# Patient Record
Sex: Female | Born: 1950
Health system: Southern US, Community
[De-identification: ages and names within clinical notes are randomized; demographics above are authoritative.]

## PROBLEM LIST (undated history)

## (undated) DIAGNOSIS — E079 Disorder of thyroid, unspecified: Secondary | ICD-10-CM

## (undated) DIAGNOSIS — K219 Gastro-esophageal reflux disease without esophagitis: Secondary | ICD-10-CM

## (undated) DIAGNOSIS — M8580 Other specified disorders of bone density and structure, unspecified site: Secondary | ICD-10-CM

## (undated) DIAGNOSIS — M722 Plantar fascial fibromatosis: Secondary | ICD-10-CM

## (undated) DIAGNOSIS — E785 Hyperlipidemia, unspecified: Secondary | ICD-10-CM

## (undated) DIAGNOSIS — G5702 Lesion of sciatic nerve, left lower limb: Secondary | ICD-10-CM

## (undated) HISTORY — DX: Plantar fascial fibromatosis: M72.2

## (undated) HISTORY — PX: BREAST SURGERY: SHX581

## (undated) HISTORY — DX: Hyperlipidemia, unspecified: E78.5

## (undated) HISTORY — DX: Gastro-esophageal reflux disease without esophagitis: K21.9

## (undated) HISTORY — PX: CATARACT EXTRACTION, BILATERAL: SHX1313

## (undated) HISTORY — DX: Disorder of thyroid, unspecified: E07.9

## (undated) HISTORY — DX: Lesion of sciatic nerve, left lower limb: G57.02

## (undated) HISTORY — PX: FRACTURE SURGERY: SHX138

## (undated) HISTORY — PX: TONSILLECTOMY: SUR1361

## (undated) HISTORY — DX: Other specified disorders of bone density and structure, unspecified site: M85.80

---

## 1999-02-28 ENCOUNTER — Other Ambulatory Visit: Admission: RE | Admit: 1999-02-28 | Discharge: 1999-02-28 | Payer: Self-pay | Admitting: Obstetrics and Gynecology

## 1999-07-17 ENCOUNTER — Encounter: Payer: Self-pay | Admitting: Emergency Medicine

## 1999-07-17 ENCOUNTER — Inpatient Hospital Stay (HOSPITAL_COMMUNITY): Admission: EM | Admit: 1999-07-17 | Discharge: 1999-07-18 | Payer: Self-pay | Admitting: Emergency Medicine

## 1999-07-26 ENCOUNTER — Ambulatory Visit (HOSPITAL_COMMUNITY): Admission: RE | Admit: 1999-07-26 | Discharge: 1999-07-26 | Payer: Self-pay | Admitting: Infectious Diseases

## 1999-08-15 ENCOUNTER — Ambulatory Visit (HOSPITAL_COMMUNITY): Admission: RE | Admit: 1999-08-15 | Discharge: 1999-08-15 | Payer: Self-pay | Admitting: Infectious Diseases

## 1999-08-25 ENCOUNTER — Ambulatory Visit (HOSPITAL_COMMUNITY): Admission: RE | Admit: 1999-08-25 | Discharge: 1999-08-25 | Payer: Self-pay | Admitting: Internal Medicine

## 1999-08-25 ENCOUNTER — Encounter: Payer: Self-pay | Admitting: Internal Medicine

## 2000-08-08 ENCOUNTER — Other Ambulatory Visit: Admission: RE | Admit: 2000-08-08 | Discharge: 2000-08-08 | Payer: Self-pay | Admitting: Gynecology

## 2002-07-08 ENCOUNTER — Other Ambulatory Visit: Admission: RE | Admit: 2002-07-08 | Discharge: 2002-07-08 | Payer: Self-pay | Admitting: Obstetrics and Gynecology

## 2003-01-10 HISTORY — PX: COLONOSCOPY: SHX174

## 2003-04-29 ENCOUNTER — Encounter: Payer: Self-pay | Admitting: Internal Medicine

## 2003-04-29 LAB — HM COLONOSCOPY

## 2003-07-15 ENCOUNTER — Other Ambulatory Visit: Admission: RE | Admit: 2003-07-15 | Discharge: 2003-07-15 | Payer: Self-pay | Admitting: Obstetrics and Gynecology

## 2003-08-30 ENCOUNTER — Emergency Department (HOSPITAL_COMMUNITY): Admission: EM | Admit: 2003-08-30 | Discharge: 2003-08-30 | Payer: Self-pay | Admitting: Emergency Medicine

## 2004-06-24 ENCOUNTER — Ambulatory Visit: Payer: Self-pay | Admitting: Internal Medicine

## 2004-06-28 ENCOUNTER — Ambulatory Visit: Payer: Self-pay | Admitting: Internal Medicine

## 2004-07-19 ENCOUNTER — Ambulatory Visit: Payer: Self-pay | Admitting: Internal Medicine

## 2004-07-26 ENCOUNTER — Other Ambulatory Visit: Admission: RE | Admit: 2004-07-26 | Discharge: 2004-07-26 | Payer: Self-pay | Admitting: Obstetrics and Gynecology

## 2004-08-03 ENCOUNTER — Encounter: Admission: RE | Admit: 2004-08-03 | Discharge: 2004-11-01 | Payer: Self-pay | Admitting: Internal Medicine

## 2004-09-27 ENCOUNTER — Ambulatory Visit: Payer: Self-pay | Admitting: Internal Medicine

## 2004-10-26 ENCOUNTER — Ambulatory Visit: Payer: Self-pay | Admitting: Internal Medicine

## 2004-12-27 ENCOUNTER — Ambulatory Visit: Payer: Self-pay | Admitting: Internal Medicine

## 2005-01-31 ENCOUNTER — Ambulatory Visit: Payer: Self-pay | Admitting: Internal Medicine

## 2005-05-02 ENCOUNTER — Ambulatory Visit: Payer: Self-pay | Admitting: Internal Medicine

## 2005-08-08 ENCOUNTER — Other Ambulatory Visit: Admission: RE | Admit: 2005-08-08 | Discharge: 2005-08-08 | Payer: Self-pay | Admitting: Obstetrics and Gynecology

## 2005-08-28 ENCOUNTER — Encounter: Admission: RE | Admit: 2005-08-28 | Discharge: 2005-08-28 | Payer: Self-pay | Admitting: Internal Medicine

## 2005-08-28 ENCOUNTER — Ambulatory Visit: Payer: Self-pay | Admitting: Internal Medicine

## 2005-09-01 ENCOUNTER — Encounter: Admission: RE | Admit: 2005-09-01 | Discharge: 2005-09-01 | Payer: Self-pay | Admitting: Internal Medicine

## 2006-06-01 ENCOUNTER — Encounter: Payer: Self-pay | Admitting: Internal Medicine

## 2006-06-01 ENCOUNTER — Ambulatory Visit: Payer: Self-pay | Admitting: Internal Medicine

## 2006-06-01 DIAGNOSIS — E890 Postprocedural hypothyroidism: Secondary | ICD-10-CM

## 2006-06-28 ENCOUNTER — Ambulatory Visit: Payer: Self-pay | Admitting: Internal Medicine

## 2006-06-29 ENCOUNTER — Encounter (INDEPENDENT_AMBULATORY_CARE_PROVIDER_SITE_OTHER): Payer: Self-pay | Admitting: *Deleted

## 2006-06-29 LAB — CONVERTED CEMR LAB
AST: 32 units/L (ref 0–37)
Cholesterol: 107 mg/dL (ref 0–200)
HDL: 28.4 mg/dL — ABNORMAL LOW (ref >39.0)
LDL Cholesterol: 61 mg/dL (ref 0–99)
Potassium: 4.2 meq/L (ref 3.5–5.1)
TSH: 0.61 microintl units/mL (ref 0.35–5.50)
Total CHOL/HDL Ratio: 3.8
VLDL: 18 mg/dL (ref 0–40)

## 2006-07-04 ENCOUNTER — Encounter (INDEPENDENT_AMBULATORY_CARE_PROVIDER_SITE_OTHER): Payer: Self-pay | Admitting: *Deleted

## 2006-08-13 ENCOUNTER — Other Ambulatory Visit: Admission: RE | Admit: 2006-08-13 | Discharge: 2006-08-13 | Payer: Self-pay | Admitting: Obstetrics and Gynecology

## 2006-11-05 ENCOUNTER — Telehealth (INDEPENDENT_AMBULATORY_CARE_PROVIDER_SITE_OTHER): Payer: Self-pay | Admitting: *Deleted

## 2007-04-29 ENCOUNTER — Telehealth (INDEPENDENT_AMBULATORY_CARE_PROVIDER_SITE_OTHER): Payer: Self-pay | Admitting: *Deleted

## 2007-06-19 ENCOUNTER — Telehealth (INDEPENDENT_AMBULATORY_CARE_PROVIDER_SITE_OTHER): Payer: Self-pay | Admitting: *Deleted

## 2007-06-25 ENCOUNTER — Ambulatory Visit: Payer: Self-pay | Admitting: Internal Medicine

## 2007-07-07 LAB — CONVERTED CEMR LAB
ALT: 23 units/L (ref 0–35)
AST: 31 units/L (ref 0–37)
Cholesterol: 106 mg/dL (ref 0–200)
Creatinine, Ser: 0.7 mg/dL (ref 0.4–1.2)
HDL: 26.9 mg/dL — ABNORMAL LOW (ref >39.0)
TSH: 1.8 microintl units/mL (ref 0.35–5.50)
Triglycerides: 73 mg/dL (ref 0–149)
VLDL: 15 mg/dL (ref 0–40)

## 2007-07-09 ENCOUNTER — Encounter (INDEPENDENT_AMBULATORY_CARE_PROVIDER_SITE_OTHER): Payer: Self-pay | Admitting: *Deleted

## 2007-07-09 LAB — CONVERTED CEMR LAB: Vit D, 1,25-Dihydroxy: 45 (ref 30–89)

## 2007-07-15 ENCOUNTER — Telehealth (INDEPENDENT_AMBULATORY_CARE_PROVIDER_SITE_OTHER): Payer: Self-pay | Admitting: *Deleted

## 2007-08-01 ENCOUNTER — Telehealth (INDEPENDENT_AMBULATORY_CARE_PROVIDER_SITE_OTHER): Payer: Self-pay | Admitting: *Deleted

## 2007-08-05 ENCOUNTER — Encounter (INDEPENDENT_AMBULATORY_CARE_PROVIDER_SITE_OTHER): Payer: Self-pay | Admitting: *Deleted

## 2007-08-09 ENCOUNTER — Ambulatory Visit: Payer: Self-pay | Admitting: Internal Medicine

## 2007-08-09 ENCOUNTER — Encounter: Payer: Self-pay | Admitting: Internal Medicine

## 2007-08-15 ENCOUNTER — Other Ambulatory Visit: Admission: RE | Admit: 2007-08-15 | Discharge: 2007-08-15 | Payer: Self-pay | Admitting: Obstetrics and Gynecology

## 2007-08-21 ENCOUNTER — Telehealth (INDEPENDENT_AMBULATORY_CARE_PROVIDER_SITE_OTHER): Payer: Self-pay | Admitting: *Deleted

## 2007-08-27 ENCOUNTER — Ambulatory Visit: Payer: Self-pay | Admitting: Internal Medicine

## 2007-08-27 DIAGNOSIS — B370 Candidal stomatitis: Secondary | ICD-10-CM

## 2007-08-27 DIAGNOSIS — E785 Hyperlipidemia, unspecified: Secondary | ICD-10-CM

## 2007-08-27 DIAGNOSIS — M8580 Other specified disorders of bone density and structure, unspecified site: Secondary | ICD-10-CM | POA: Insufficient documentation

## 2007-08-27 DIAGNOSIS — M810 Age-related osteoporosis without current pathological fracture: Secondary | ICD-10-CM | POA: Insufficient documentation

## 2007-08-27 LAB — CONVERTED CEMR LAB: Cholesterol, target level: 200 mg/dL

## 2007-09-04 ENCOUNTER — Telehealth (INDEPENDENT_AMBULATORY_CARE_PROVIDER_SITE_OTHER): Payer: Self-pay | Admitting: *Deleted

## 2007-09-12 ENCOUNTER — Telehealth (INDEPENDENT_AMBULATORY_CARE_PROVIDER_SITE_OTHER): Payer: Self-pay | Admitting: *Deleted

## 2007-09-13 ENCOUNTER — Telehealth (INDEPENDENT_AMBULATORY_CARE_PROVIDER_SITE_OTHER): Payer: Self-pay | Admitting: *Deleted

## 2007-10-14 ENCOUNTER — Ambulatory Visit: Payer: Self-pay | Admitting: Internal Medicine

## 2007-10-14 DIAGNOSIS — M722 Plantar fascial fibromatosis: Secondary | ICD-10-CM

## 2007-10-14 HISTORY — DX: Plantar fascial fibromatosis: M72.2

## 2007-11-06 ENCOUNTER — Ambulatory Visit: Payer: Self-pay | Admitting: Internal Medicine

## 2007-11-15 ENCOUNTER — Telehealth (INDEPENDENT_AMBULATORY_CARE_PROVIDER_SITE_OTHER): Payer: Self-pay | Admitting: *Deleted

## 2007-11-15 LAB — CONVERTED CEMR LAB
Alkaline Phosphatase: 68 units/L (ref 39–117)
Bilirubin, Direct: 0.1 mg/dL (ref 0.0–0.3)
Cholesterol: 111 mg/dL (ref 0–200)
LDL Cholesterol: 63 mg/dL (ref 0–99)
Total Bilirubin: 0.8 mg/dL (ref 0.3–1.2)
Total CHOL/HDL Ratio: 5.3
Total Protein: 6.9 g/dL (ref 6.0–8.3)
VLDL: 27 mg/dL (ref 0–40)

## 2007-11-18 ENCOUNTER — Encounter (INDEPENDENT_AMBULATORY_CARE_PROVIDER_SITE_OTHER): Payer: Self-pay | Admitting: *Deleted

## 2008-01-01 ENCOUNTER — Telehealth (INDEPENDENT_AMBULATORY_CARE_PROVIDER_SITE_OTHER): Payer: Self-pay | Admitting: *Deleted

## 2008-01-02 ENCOUNTER — Ambulatory Visit: Payer: Self-pay | Admitting: Internal Medicine

## 2008-01-02 DIAGNOSIS — K117 Disturbances of salivary secretion: Secondary | ICD-10-CM

## 2008-01-02 DIAGNOSIS — R439 Unspecified disturbances of smell and taste: Secondary | ICD-10-CM

## 2008-02-25 ENCOUNTER — Encounter (INDEPENDENT_AMBULATORY_CARE_PROVIDER_SITE_OTHER): Payer: Self-pay | Admitting: *Deleted

## 2008-02-25 ENCOUNTER — Ambulatory Visit: Payer: Self-pay | Admitting: Sports Medicine

## 2008-02-25 DIAGNOSIS — M79609 Pain in unspecified limb: Secondary | ICD-10-CM

## 2008-02-26 ENCOUNTER — Encounter: Payer: Self-pay | Admitting: Sports Medicine

## 2008-08-17 ENCOUNTER — Encounter: Payer: Self-pay | Admitting: Obstetrics and Gynecology

## 2008-08-17 ENCOUNTER — Ambulatory Visit: Payer: Self-pay | Admitting: Obstetrics and Gynecology

## 2008-08-17 ENCOUNTER — Other Ambulatory Visit: Admission: RE | Admit: 2008-08-17 | Discharge: 2008-08-17 | Payer: Self-pay | Admitting: Obstetrics and Gynecology

## 2008-09-17 ENCOUNTER — Telehealth (INDEPENDENT_AMBULATORY_CARE_PROVIDER_SITE_OTHER): Payer: Self-pay | Admitting: *Deleted

## 2008-10-07 ENCOUNTER — Telehealth (INDEPENDENT_AMBULATORY_CARE_PROVIDER_SITE_OTHER): Payer: Self-pay | Admitting: *Deleted

## 2008-10-30 ENCOUNTER — Ambulatory Visit: Payer: Self-pay | Admitting: Internal Medicine

## 2008-11-01 LAB — CONVERTED CEMR LAB
ALT: 23 U/L
AST: 24 U/L
Albumin: 4 g/dL
Alkaline Phosphatase: 73 U/L
BUN: 15 mg/dL
Basophils Absolute: 0 10*3/uL
Basophils Relative: 0.5 %
Bilirubin, Direct: 0 mg/dL
CO2: 30 meq/L
Calcium: 9.1 mg/dL
Chloride: 105 meq/L
Cholesterol: 128 mg/dL
Creatinine, Ser: 0.8 mg/dL
Eosinophils Absolute: 0.1 10*3/uL
Eosinophils Relative: 1.5 %
GFR calc non Af Amer: 78.57 mL/min
Glucose, Bld: 78 mg/dL
HCT: 42.4 %
HDL: 27.7 mg/dL — ABNORMAL LOW
Hemoglobin: 14.4 g/dL
Hgb A1c MFr Bld: 5.5 %
LDL Cholesterol: 72 mg/dL
Lymphocytes Relative: 32.2 %
Lymphs Abs: 1.9 10*3/uL
MCHC: 34.1 g/dL
MCV: 96.1 fL
Monocytes Absolute: 0.6 10*3/uL
Monocytes Relative: 10.2 %
Neutro Abs: 3.3 10*3/uL
Neutrophils Relative %: 55.6 %
Platelets: 294 10*3/uL
Potassium: 4.1 meq/L
RBC: 4.41 M/uL
RDW: 12.4 %
Sodium: 142 meq/L
TSH: 1.75 u[IU]/mL
Total Bilirubin: 1 mg/dL
Total CHOL/HDL Ratio: 5
Total Protein: 7.1 g/dL
Triglycerides: 142 mg/dL
VLDL: 28.4 mg/dL
WBC: 5.9 10*3/uL

## 2008-11-10 ENCOUNTER — Ambulatory Visit: Payer: Self-pay | Admitting: Internal Medicine

## 2008-11-10 DIAGNOSIS — M6740 Ganglion, unspecified site: Secondary | ICD-10-CM | POA: Insufficient documentation

## 2008-11-21 ENCOUNTER — Encounter: Payer: Self-pay | Admitting: Internal Medicine

## 2008-11-24 ENCOUNTER — Ambulatory Visit: Payer: Self-pay | Admitting: Internal Medicine

## 2008-11-24 DIAGNOSIS — J309 Allergic rhinitis, unspecified: Secondary | ICD-10-CM | POA: Insufficient documentation

## 2008-11-24 DIAGNOSIS — R519 Headache, unspecified: Secondary | ICD-10-CM | POA: Insufficient documentation

## 2008-11-24 DIAGNOSIS — R3915 Urgency of urination: Secondary | ICD-10-CM

## 2008-11-24 DIAGNOSIS — R51 Headache: Secondary | ICD-10-CM

## 2009-08-19 ENCOUNTER — Other Ambulatory Visit: Admission: RE | Admit: 2009-08-19 | Discharge: 2009-08-19 | Payer: Self-pay | Admitting: Obstetrics and Gynecology

## 2009-08-19 ENCOUNTER — Ambulatory Visit: Payer: Self-pay | Admitting: Obstetrics and Gynecology

## 2009-11-17 ENCOUNTER — Telehealth (INDEPENDENT_AMBULATORY_CARE_PROVIDER_SITE_OTHER): Payer: Self-pay | Admitting: *Deleted

## 2010-01-12 ENCOUNTER — Other Ambulatory Visit: Payer: Self-pay | Admitting: Internal Medicine

## 2010-01-12 ENCOUNTER — Ambulatory Visit
Admission: RE | Admit: 2010-01-12 | Discharge: 2010-01-12 | Payer: Self-pay | Source: Home / Self Care | Attending: Internal Medicine | Admitting: Internal Medicine

## 2010-01-12 LAB — BASIC METABOLIC PANEL
BUN: 9 mg/dL (ref 6–23)
CO2: 28 mEq/L (ref 19–32)
Calcium: 9 mg/dL (ref 8.4–10.5)
Chloride: 107 mEq/L (ref 96–112)
Creatinine, Ser: 0.5 mg/dL (ref 0.4–1.2)
GFR: 137.76 mL/min (ref >60.00)
Glucose, Bld: 91 mg/dL (ref 70–99)
Potassium: 4.9 mEq/L (ref 3.5–5.1)
Sodium: 140 mEq/L (ref 135–145)

## 2010-01-12 LAB — CBC WITH DIFFERENTIAL/PLATELET
Basophils Absolute: 0 10*3/uL (ref 0.0–0.1)
Basophils Relative: 0.5 % (ref 0.0–3.0)
Eosinophils Absolute: 0.1 10*3/uL (ref 0.0–0.7)
Eosinophils Relative: 2.4 % (ref 0.0–5.0)
HCT: 41.9 % (ref 36.0–46.0)
Hemoglobin: 14.1 g/dL (ref 12.0–15.0)
Lymphocytes Relative: 33.3 % (ref 12.0–46.0)
Lymphs Abs: 2 10*3/uL (ref 0.7–4.0)
MCHC: 33.6 g/dL (ref 30.0–36.0)
MCV: 94 fl (ref 78.0–100.0)
Monocytes Absolute: 0.6 10*3/uL (ref 0.1–1.0)
Monocytes Relative: 9.3 % (ref 3.0–12.0)
Neutro Abs: 3.3 10*3/uL (ref 1.4–7.7)
Neutrophils Relative %: 54.5 % (ref 43.0–77.0)
Platelets: 324 10*3/uL (ref 150.0–400.0)
RBC: 4.45 Mil/uL (ref 3.87–5.11)
RDW: 13.1 % (ref 11.5–14.6)
WBC: 6 10*3/uL (ref 4.5–10.5)

## 2010-01-12 LAB — HEPATIC FUNCTION PANEL
ALT: 24 U/L (ref 0–35)
AST: 24 U/L (ref 0–37)
Albumin: 3.7 g/dL (ref 3.5–5.2)
Alkaline Phosphatase: 88 U/L (ref 39–117)
Bilirubin, Direct: 0.1 mg/dL (ref 0.0–0.3)
Total Bilirubin: 0.6 mg/dL (ref 0.3–1.2)
Total Protein: 6.9 g/dL (ref 6.0–8.3)

## 2010-01-12 LAB — HEMOGLOBIN A1C: Hgb A1c MFr Bld: 5.8 % (ref 4.6–6.5)

## 2010-01-12 LAB — LIPID PANEL
Cholesterol: 117 mg/dL (ref 0–200)
HDL: 22.7 mg/dL — ABNORMAL LOW (ref >39.00)
LDL Cholesterol: 70 mg/dL (ref 0–99)
Total CHOL/HDL Ratio: 5
Triglycerides: 121 mg/dL (ref 0.0–149.0)
VLDL: 24.2 mg/dL (ref 0.0–40.0)

## 2010-01-12 LAB — TSH: TSH: 2.68 u[IU]/mL (ref 0.35–5.50)

## 2010-01-20 ENCOUNTER — Encounter: Payer: Self-pay | Admitting: Internal Medicine

## 2010-01-20 ENCOUNTER — Ambulatory Visit
Admission: RE | Admit: 2010-01-20 | Discharge: 2010-01-20 | Payer: Self-pay | Source: Home / Self Care | Attending: Internal Medicine | Admitting: Internal Medicine

## 2010-01-20 DIAGNOSIS — R1310 Dysphagia, unspecified: Secondary | ICD-10-CM | POA: Insufficient documentation

## 2010-02-08 NOTE — Progress Notes (Signed)
Summary: need lab orders for 2010-01-16--entered  Phone Note Call from Patient   Caller: Patient Summary of Call: has CPX for 01/20/2010---has labs for 2010-01-16----what orders and codes should I use?? Initial call taken by: Jerolyn Shin,  November 17, 2009 10:03 AM  Follow-up for Phone Call        TLB-Lipid Panel [80061-LIPID] TLB-Hepatic/Liver Function Pnl [80076-HEPATIC] TLB-TSH (Thyroid Stimulating Hormone) [84443-TSH] TLB-A1C / Hgb A1C (Glycohemoglobin) [83036-A1C] TLB-BMP (Basic Metabolic Panel-BMET) [80048-METABOL] TLB-CBC Platelet - w/Differential [85025-CBCD] Stool Cards  V70.0/272.4/995.20/244.9 Follow-up by: Shonna Chock CMA,  November 17, 2009 11:40 AM  Additional Follow-up for Phone Call Additional follow up Details #1::        entered codes for lab on 01-16-10 Additional Follow-up by: Jerolyn Shin,  November 18, 2009 10:14 AM

## 2010-02-10 NOTE — Assessment & Plan Note (Signed)
Summary: cpx///sph   Vital Signs:  Patient profile:   60 year old female Height:      66 inches Weight:      216 pounds BMI:     34.99 Temp:     98.2 degrees F oral Pulse rate:   92 / minute Resp:     14 per minute BP sitting:   130 / 88  (left arm) Cuff size:   large  Vitals Entered By: Shonna Chock CMA (January 20, 2010 1:06 PM) CC: CPX- not fasting , Lipid Management   CC:  CPX- not fasting  and Lipid Management.  History of Present Illness:    Holly Cochran is here for a physical; occasionally she has sensation of  "choking " with meals, on average several times / week for several months .  Intermittent Omeprazole helps.    Hyperlipidemia Follow-Up:  The patient denies muscle aches, GI upset, abdominal pain, flushing, itching, constipation, diarrhea, and fatigue.  The patient denies the following symptoms: chest pain/pressure, exercise intolerance, dypsnea, palpitations, syncope, and pedal edema.  Compliance with medications (by patient report) has been near 100%.  Dietary compliance has been poor.  The patient reports no exercise.  Adjunctive measures currently used by the patient include fiber, ASA, and fish oil supplements.    Lipid Management History:      Positive NCEP/ATP III risk factors include female age 65 years old or older and HDL cholesterol less than 40.  Negative NCEP/ATP III risk factors include no history of early menopause without estrogen hormone replacement, non-diabetic, no family history for ischemic heart disease, non-tobacco-user status, non-hypertensive, no ASHD (atherosclerotic heart disease), no prior stroke/TIA, no peripheral vascular disease, and no history of aortic aneurysm.     Current Medications (verified): 1)  Unithroid Direct 100 Mcg Tabs (Levothyroxine Sodium) .Marland Kitchen.. 1 By Mouth Once Daily **appointment Due** 2)  Lexapro 20 Mg  Tabs (Escitalopram Oxalate) .Marland Kitchen.. 1 Once Daily**office Visit Due Now** 3)  Pravastatin Sodium 40 Mg  Tabs (Pravastatin  Sodium) .Marland Kitchen.. 1 Qhs 4)  Loratadine 10 Mg Tabs (Loratadine) .Marland Kitchen.. 1 Once Daily 5)  Nasonex 50 Mcg/act Susp (Mometasone Furoate) .Marland Kitchen.. 1 Spray Two Times A Day As Needed 6)  Calcium 600 Mg Tabs (Calcium) .Marland Kitchen.. 1 By Mouth Two Times A Day 7)  Fish Oil 1200 Mg Caps (Omega-3 Fatty Acids) .Marland Kitchen.. 1 By Mouth Two Times A Day 8)  Vitamin D 1000 Unit Tabs (Cholecalciferol) .Marland Kitchen.. 1 By Mouth Once Daily  Allergies: 1)  ! Prednisone  Past History:  Past Medical History: S/P RAI thyroid ablation for hyperthyroidism presenting as PSVT ? 2001; Hyperlipidemia: NMR Lipoprofile 2006: LDL 129( 2319/2016), HDL 18, TG 180. LDL goal = < 80. Framingham Study LDL goal = < 130. Osteopenia: T score - 1.8 @ Femoral Neck 2003  Past Surgical History: 04/2003 colonoscopy: negative Tonsillectomy  Family History: M uncle: DM,melanoma; F :pancreatic cancer; Mother: DM; no sibs. NO  PREMATURE CAD  Social History: no diet Occupation:Teacher Never Smoked Alcohol use-yes: socially  Review of Systems  The patient denies anorexia, fever, weight loss, weight gain, vision loss, decreased hearing, hoarseness, syncope, prolonged cough, headaches, hemoptysis, melena, hematochezia, severe indigestion/heartburn, hematuria, suspicious skin lesions, depression, unusual weight change, abnormal bleeding, enlarged lymph nodes, and angioedema.         No  frank dysphagia  Physical Exam  General:  well-nourished;alert,appropriate and cooperative throughout examination Head:  Normocephalic and atraumatic without obvious abnormalities.  Eyes:  No corneal or conjunctival inflammation noted.  Perrla. Funduscopic exam benign, without hemorrhages, exudates or papilledema.  Ears:  External ear exam shows no significant lesions or deformities.  Otoscopic examination reveals clear canals, tympanic membranes are intact bilaterally without bulging, retraction, inflammation or discharge. Hearing is grossly normal bilaterally. Nose:  External nasal  examination shows no deformity or inflammation. Nasal mucosa are pink and moist without lesions or exudates. Mouth:  Oral mucosa and oropharynx without lesions or exudates.  Teeth in good repair. No pharyngeal erythema.   Neck:  No deformities, masses, or tenderness noted. Breasts:  Dr Eda Paschal Lungs:  Normal respiratory effort, chest expands symmetrically. Lungs are clear to auscultation, no crackles or wheezes. Heart:  Normal rate and regular rhythm. S1 and S2 normal without gallop, murmur, click, rub .S4 Abdomen:  Bowel sounds positive,abdomen soft and non-tender without masses, organomegaly or hernias noted. Genitalia:  Dr Eda Paschal Msk:  No deformity or scoliosis noted of thoracic or lumbar spine.   Pulses:  R and L carotid,radial,dorsalis pedis and posterior tibial pulses are full and equal bilaterally Extremities:  No clubbing, cyanosis, edema, or deformity noted with normal full range of motion of all joints.   Neurologic:  alert & oriented X3 and DTRs symmetrical and normal.   Skin:  Intact without suspicious lesions or rashes Cervical Nodes:  No lymphadenopathy noted Axillary Nodes:  No palpable lymphadenopathy Psych:  memory intact for recent and remote, normally interactive, and good eye contact.     Impression & Recommendations:  Problem # 1:  ROUTINE GENERAL MEDICAL EXAM@HEALTH  CARE FACL (ICD-V70.0)  Orders: EKG w/ Interpretation (93000)  Problem # 2:  PROBLEMS WITH SWALLOWING AND MASTICATION (ICD-V41.6)  Problem # 3:  HYPERLIPIDEMIA (ICD-272.4)  Her updated medication list for this problem includes:    Pravastatin Sodium 40 Mg Tabs (Pravastatin sodium) .Marland Kitchen... 1 qhs  Problem # 4:  HYPOTHYROIDISM (ICD-244.9)  Her updated medication list for this problem includes:    Unithroid Direct 100 Mcg Tabs (Levothyroxine sodium) .Marland Kitchen... 1 by mouth once daily  Complete Medication List: 1)  Unithroid Direct 100 Mcg Tabs (Levothyroxine sodium) .Marland Kitchen.. 1 by mouth once daily 2)   Lexapro 20 Mg Tabs (Escitalopram oxalate) .Marland Kitchen.. 1 once daily 3)  Pravastatin Sodium 40 Mg Tabs (Pravastatin sodium) .Marland Kitchen.. 1 qhs 4)  Loratadine 10 Mg Tabs (Loratadine) .Marland Kitchen.. 1 once daily 5)  Nasonex 50 Mcg/act Susp (Mometasone furoate) .Marland Kitchen.. 1 spray two times a day as needed 6)  Calcium 600 Mg Tabs (Calcium) .Marland Kitchen.. 1 by mouth two times a day 7)  Fish Oil 1200 Mg Caps (Omega-3 fatty acids) .Marland Kitchen.. 1 by mouth two times a day 8)  Vitamin D 1000 Unit Tabs (Cholecalciferol) .Marland Kitchen.. 1 by mouth once daily 9)  Ranitidine Hcl 150 Mg Caps (Ranitidine hcl) .Marland Kitchen.. 1 two times a day pre meals  Lipid Assessment/Plan:      Based on NCEP/ATP III, the patient's risk factor category is "0-1 risk factors".  The patient's lipid goals are as follows: Total cholesterol goal is 200; LDL cholesterol goal is 130; HDL cholesterol goal is 40; Triglyceride goal is 150.  Her LDL cholesterol goal has been met.    Patient Instructions: 1)  Please have a [place] Heart Panel (1304X) with next Lipid assessment. 2)  It is important that you exercise regularly at least 20 minutes 5 times a week. If you develop chest pain, have severe difficulty breathing, or feel very tired , stop exercising immediately and seek medical attention. 3)  Take calcium  600 mg twice a day &  Vitamin D3  @ least 1000 International Units once daily . Check vitamin D level annually. 4)  Avoid foods high in acid (tomatoes, citrus juices, spicy foods). Avoid eating within two hours of lying down or before exercising. Do not over eat; try smaller more frequent meals. Elevate head of bed twelve inches when sleeping. GI consult if swallowing issues persist with Ranitidine two times a day  Prescriptions: RANITIDINE HCL 150 MG CAPS (RANITIDINE HCL) 1 two times a day pre meals  #60 x 2   Entered and Authorized by:   Marga Melnick MD   Signed by:   Marga Melnick MD on 01/20/2010   Method used:   Print then Give to Patient   RxID:   [id number] PRAVASTATIN SODIUM 40 MG   TABS (PRAVASTATIN SODIUM) 1 qhs  #90 x 3   Entered and Authorized by:   Marga Melnick MD   Signed by:   Marga Melnick MD on 01/20/2010   Method used:   Print then Give to Patient   RxID:   [id number] LEXAPRO 20 MG  TABS (ESCITALOPRAM OXALATE) 1 once daily  #90 x 3   Entered and Authorized by:   Marga Melnick MD   Signed by:   Marga Melnick MD on 01/20/2010   Method used:   Print then Give to Patient   RxID:   [id number] UNITHROID DIRECT 100 MCG TABS (LEVOTHYROXINE SODIUM) 1 by mouth once daily  #90 x 3   Entered and Authorized by:   Marga Melnick MD   Signed by:   Marga Melnick MD on 01/20/2010   Method used:   Electronically to        [id number]* (retail)       [address]       [place], [place]  [address]       Ph: [phone] or [phone]       Fax: [phone]   RxID:   [id number]    Orders Added: 1)  Est. Patient 40-64 years [99396] 2)  EKG w/ Interpretation [93000]

## 2010-02-10 NOTE — Procedures (Signed)
Summary: Colonoscopy/[facility]  Colonoscopy/[facility]   Imported By: Lanelle Bal 01/28/2010 09:36:19  _____________________________________________________________________  External Attachment:    Type:   Image     Comment:   External Document

## 2010-05-27 NOTE — H&P (Signed)
[place]  Patient:    Holly Cochran, Holly Cochran                       MRN: [id number] Adm. Date:  [id number] Attending:  Dolores Patty CC:         Celso Sickle, M.D.                         History and Physical  CHIEF COMPLAINT: Holly Cochran is a 60 year old white female admitted with paroxysmal supraventricular tachycardia with rates of approximately 165, unresponsive to adenosine IV x 2 and Cardizem IV x 2.  HISTORY OF PRESENT ILLNESS: Cardiac enzymes have been negative but tachycardia persists.  She has had this intermittently for months, lasting several minutes, usually at bedtime.  It has lasted hours yesterday and today.  She denies any chest pain, shortness of breath, or other cardiopulmonary symptoms. She is physically active and played golf today.  MEDICATIONS: She did take Allegra-D on Wednesday but none since.  In April of 2001 she was switched to plain Allegra but this prescription apparently was misplaced and Allegra-D was prescribed.  SOCIAL HISTORY: She has an occasional chocolate but otherwise has no caffeine. She denies diet pills or nicotine ingestion.  She is a Runner, broadcasting/film/video.  She drinks occasionally.  FAMILY HISTORY: Positive for cancer of the pancreas in her father.  There is no cardiovascular disease in the family.  An uncle had melanoma and diabetes. Grandmother had myeloma and diabetes.  PHYSICAL EXAMINATION:  GENERAL: She is a healthy appearing white female in no acute distress despite tachycardia of 165 on telemetry.  VITAL SIGNS: Blood pressure 142/63, respiratory rate 20, temperature 97.9 degrees.  NECK: Thyroid is small without nodularity.  HEENT: Otolaryngologic examination is unremarkable.  Funduscopic examination negative.  She has no lymphadenopathy about the head, neck, or axilla.  CHEST: Clear.  CARDIAC: She exhibits rapid tachycardia with no significant murmurs.  The aorta is  palpable.  ABDOMEN: Nontender, without organomegaly.  EXTREMITIES: Posterior tibial pulses are slightly decreased.  There is no edema.  Homans sign negative.  LABORATORY DATA: EKG reveals supraventricular tachycardia.  Potassium is 4.1.  Hematocrit is 37.  CPK was 44 with less than 0.3 MB fraction.  Troponin is pending.  PLAN: She is now to be admitted to telemetry with IV diltiazem and beta-blockers orally.  Thyroid functions will be collected.  She will placed on low-dose sedation as well.  Dr. Garnette Scheuermann, cardiologist, has been consulted and will see the patient.  At present she is tolerating rhythm well and has no underlying co-morbidities. DD:  07/17/99 TD:  07/18/99 Job: [id number]

## 2010-05-27 NOTE — Consult Note (Signed)
[place]  Patient:    Holly Cochran, Holly Cochran                       MRN: [id number] Proc. Date: 07/17/99 Attending:  Darci Needle, M.D. Dictator:   [phone] CC:         Titus Dubin. Alwyn Ren, M.D. [facility]                          Consultation Report  REASON FOR CONSULTATION: Tachycardia.  CONCLUSIONS:  1. Supraventricular tachycardia, rate 165.  Electrocardiogram is most     consistent with sinus tachycardia.  Rule out atrial or SA nodal re-     entrant tachycardia.     a. Tachycardia not responsive to IV Adenocard and IV Cardizem.  RECOMMENDATIONS:  1. IV beta-blocker therapy followed by oral beta-blocker therapy.  2. Further work-up pending response to therapy.  3. Rule out hyperthyroidism, anemia, hyperadrenergic state, although     pheochromocytoma seems unlikely given normal blood pressure.  Absence     of neck vein elevation and hypoxia make pulmonary embolism and     pericardial effusion with tamponade unlikely.  COMMENTS: The patient is 60 years of age and has a history of palpitations off and on for years.  The patient has noticed more frequent palpitations over the past two to three months.  The current situation started this morning around breakfast and after lunch she noticed a rapid sustained increase in heart rate.  She came to the emergency room for further evaluation.  She has not had syncope, chest pain, or any cardiopulmonary complaint associated with this. She does not take any medication on a chronic basis other than Claritin-D. She has not had Claritin-D or Allegra-D today but did have plain Claritin.  MEDICATIONS: Allegra-D p.r.n.  PAST MEDICAL HISTORY: No significant past medical problems.  ALLERGIES: No known drug allergies.  SOCIAL HISTORY: She does not smoke and drinks alcohol socially.  FAMILY HISTORY: Negative for cardiovascular disease.  PHYSICAL EXAMINATION:  GENERAL: The patient is in no distress.  VITAL SIGNS:  Blood pressure 140/80, heart rate 168 and after 10 mg of IV Lopressor heart rate is 120.  SKIN: Warm and dry.  No nail bed cyanosis noted.  HEENT: Unremarkable.  No lid lag noted.  CHEST: Clear.  No wheezing.  NECK: No JVD, thyromegaly, or carotid bruits.  CARDIAC: Tachycardia.  No murmur or gallop.  ABDOMEN: Soft.  Liver and spleen not palpable.  Bowel sounds normal.  EXTREMITIES: No edema.  Pulses are 2+ and symmetric in upper and lower extremities.  NEUROLOGIC: Normal.  LABORATORY DATA: EKG reveals sinus tachycardia and is otherwise normal.  Chest x-ray is normal.  Hemoglobin is 13.  Blood sugar 104. DD:  07/17/99 TD:  07/18/99 Job: [id number]

## 2010-05-27 NOTE — Discharge Summary (Signed)
[place]. [facility]  Patient:    Holly Cochran, Holly Cochran                       MRN: [id number] Adm. Date:  [id number] Attending:  Dolores Patty Dictator:   Pricilla Riffle. Trisha Mangle, M.D. [facility] CC:         Titus Dubin. Alwyn Ren, M.D. [facility]             Fritzi Mandes, M.D.             Darci Needle, M.D.                           Discharge Summary  HISTORY OF PRESENT ILLNESS:  Holly Cochran is a 60 year old white female patient of Dr. Marga Melnick, admitted by Dr. Alwyn Ren due to rapid heartbeat.  She presented to the emergency room on 07/17/99, was noted to have sinus tachycardia unresponsive to Adenosine and Cardizem.  She stated that the symptoms of tachycardia and palpitations had been present intermittently for months to weeks.  Lasted minutes to hours.  However, on the last two days while playing golf she noted that it was persistent.  She also noted that while she was putting her hands were very unstable.  She also noted yesterday when she was trying to lift a cup of coffee that her hand was shaking as well.  PAST MEDICAL HISTORY:  Unremarkable.  PAST SURGICAL HISTORY:  Tonsillectomy and adenoidectomy.  CURRENT MEDICATIONS:  She does have some allergies for which she takes Allegra-D.  SOCIAL HISTORY:  She is a Tax adviser.  She occasionally drinks caffeine and eats chocolate.  HOSPITAL COURSE:  TACHYCARDIA:  The patient was noted to have a persistent sinus tachycardia.  She was admitted to the 5100 unit at [facility]. CK-MBs and troponins were negative.  The patient was seen in consultation by Dr. Garnette Scheuermann.  Her thyroid function tests returned consistent with hyperthyroidism.  Her TSH was less than 0.019, and her free T4 was 3.58.  Her symptoms on presentation were consistent with hyperthyroidism.  She was started on atenolol 50 mg p.o. q.d.  She was also started on PTU or propylthiouracil 100 mg q.8h.  She was given a patient information  sheet regarding hyperthyroidism.  An outpatient thyroid uptake scan is scheduled for Tuesday, July 17 at 10:30, with the scan to be completed on Wednesday, July 18 at 11 a.m. in [facility] outpatient radiology.  Followup appointment has been set up with Dr. Alwyn Ren for two weeks.  She will also have a CBC drawn in one week to rule out agranulocytosis related to PTU.  She has an appointment with Dr. Coralee North, endocrinologist at [facility] on Monday, August 6 at 8 a.m. DD:  07/18/99 TD:  07/18/99 Job: [id number]

## 2010-05-28 ENCOUNTER — Other Ambulatory Visit: Payer: Self-pay | Admitting: Internal Medicine

## 2010-08-02 ENCOUNTER — Other Ambulatory Visit: Payer: Self-pay | Admitting: Orthopedic Surgery

## 2010-08-02 DIAGNOSIS — M79672 Pain in left foot: Secondary | ICD-10-CM

## 2010-08-03 ENCOUNTER — Ambulatory Visit
Admission: RE | Admit: 2010-08-03 | Discharge: 2010-08-03 | Disposition: A | Discharge status: Home / Self Care | Payer: BC Managed Care – PPO | Source: Ambulatory Visit | Attending: Orthopedic Surgery | Admitting: Orthopedic Surgery

## 2010-08-03 DIAGNOSIS — M79672 Pain in left foot: Secondary | ICD-10-CM

## 2010-08-10 HISTORY — PX: PLANTAR FASCIA SURGERY: SHX746

## 2010-08-15 ENCOUNTER — Encounter: Payer: Self-pay | Admitting: Gynecology

## 2010-08-23 ENCOUNTER — Ambulatory Visit (INDEPENDENT_AMBULATORY_CARE_PROVIDER_SITE_OTHER): Payer: BC Managed Care – PPO | Admitting: Obstetrics and Gynecology

## 2010-08-23 ENCOUNTER — Other Ambulatory Visit (HOSPITAL_COMMUNITY)
Admission: RE | Admit: 2010-08-23 | Discharge: 2010-08-23 | Disposition: A | Discharge status: Home / Self Care | Payer: BC Managed Care – PPO | Source: Ambulatory Visit | Attending: Obstetrics and Gynecology | Admitting: Obstetrics and Gynecology

## 2010-08-23 ENCOUNTER — Encounter: Payer: Self-pay | Admitting: Obstetrics and Gynecology

## 2010-08-23 VITALS — BP 134/86 | Ht 65.0 in | Wt 212.0 lb

## 2010-08-23 DIAGNOSIS — Z01419 Encounter for gynecological examination (general) (routine) without abnormal findings: Secondary | ICD-10-CM

## 2010-08-23 NOTE — Progress Notes (Signed)
The patient came to see me today for an annual GYN exam. She also sees Dr. Alwyn Ren. She was previously on Fosamax but has now been off of it for a significant period of time. She does not have any history of having a bone density after stopping. She is can get her yearly mammogram today. She is having no GYN problems.  Physical examination: HEENT within normal limits. Neck: Thyroid not large. No masses. Supraclavicular nodes: not enlarged. Breasts: Examined in both sitting midline position. No skin changes and no masses. Abdomen: Soft no guarding rebound or masses or hernia. Pelvic: External: Within normal limits. BUS: Within normal limits. Vaginal:within normal limits. Good estrogen effect. No evidence of cystocele rectocele or enterocele. Cervix: clean. Uterus: Normal size and shape. Adnexa: No masses. Rectovaginal exam: Confirmatory and negative. Extremities: Within normal limits.  Assessment: 1. Normal GYN exam 2. Osteopenia  Plan: 1. Mammogram 2. Bone density at the [facility].

## 2010-08-25 ENCOUNTER — Ambulatory Visit (HOSPITAL_BASED_OUTPATIENT_CLINIC_OR_DEPARTMENT_OTHER)
Admission: RE | Admit: 2010-08-25 | Discharge: 2010-08-25 | Disposition: A | Discharge status: Home / Self Care | Payer: BC Managed Care – PPO | Source: Ambulatory Visit | Attending: Orthopedic Surgery | Admitting: Orthopedic Surgery

## 2010-08-25 ENCOUNTER — Encounter: Payer: Self-pay | Admitting: Obstetrics and Gynecology

## 2010-08-25 DIAGNOSIS — M722 Plantar fascial fibromatosis: Secondary | ICD-10-CM | POA: Insufficient documentation

## 2010-08-25 DIAGNOSIS — E785 Hyperlipidemia, unspecified: Secondary | ICD-10-CM | POA: Insufficient documentation

## 2010-08-25 DIAGNOSIS — F3289 Other specified depressive episodes: Secondary | ICD-10-CM | POA: Insufficient documentation

## 2010-08-25 DIAGNOSIS — Z01812 Encounter for preprocedural laboratory examination: Secondary | ICD-10-CM | POA: Insufficient documentation

## 2010-08-25 DIAGNOSIS — F329 Major depressive disorder, single episode, unspecified: Secondary | ICD-10-CM | POA: Insufficient documentation

## 2010-08-26 LAB — POCT HEMOGLOBIN-HEMACUE: Hemoglobin: 14.5 g/dL (ref 12.0–15.0)

## 2010-09-01 NOTE — Op Note (Signed)
  NAME:  Holly Cochran, Holly Cochran NO.:  [id number]  MEDICAL RECORD NO.:  [id number]  LOCATION:                                 FACILITY:  PHYSICIAN:  Loreta Ave, M.D.      DATE OF BIRTH:  DATE OF PROCEDURE: DATE OF DISCHARGE:                              OPERATIVE REPORT   PREOPERATIVE DIAGNOSIS:  Chronic plantar fasciitis, left foot.  POSTOPERATIVE DIAGNOSIS:  Chronic plantar fasciitis, left foot.  PROCEDURE:  Endoscopic plantar fascial release, left foot.  SURGEON:  Loreta Ave, MD  ASSISTANT:  Zonia Kief, PA  ANESTHESIA:  General.  BLOOD LOSS:  Minimal.  TOURNIQUET TIME:  30 minutes.  SPECIMENS:  None.  CULTURES:  None.  COMPLICATIONS:  None.  DRESSING:  Soft compressive.  PROCEDURE:  The patient brought to the operating room, placed on the operating table in supine position.  After adequate anesthesia had been obtained, tourniquet applied.  Prepped and draped in usual sterile fashion.  Extended elevation Esmarch tourniquet inflated to 250 mmHg. With fluoroscopic guidance, the plantar fascia attachment to the spur at the os calcis identified.  Incision medial and lateral.  Cannula placed on the superficial side of the fascia.  Once I had good confirmation of this visually and fluoroscopically, I used the endoscopic system to do complete release of plantar fascia from medial to lateral down to the muscle below.  I looked plantar just to confirm there was nothing below.  Confirmed good release.  Wound irrigated.  Cannula removed.  Portals closed with nylon.  Sterile compressive dressing applied.  Tourniquet was removed.  Anesthesia reversed.  Brought to the recovery room.  Tolerated surgery well.  No complications.     Loreta Ave, M.D.     DFM/MEDQ  D:  08/25/2010  T:  08/25/2010  Job:  [id number]  Electronically Signed by Mckinley Jewel M.D. on 09/01/2010 04:15:12 PM

## 2011-01-19 ENCOUNTER — Other Ambulatory Visit: Payer: Self-pay | Admitting: Internal Medicine

## 2011-01-19 NOTE — Telephone Encounter (Signed)
Patient needs to schedule a CPX  

## 2011-01-24 ENCOUNTER — Other Ambulatory Visit: Payer: Self-pay | Admitting: Internal Medicine

## 2011-02-08 ENCOUNTER — Ambulatory Visit (INDEPENDENT_AMBULATORY_CARE_PROVIDER_SITE_OTHER): Payer: BC Managed Care – PPO | Admitting: Internal Medicine

## 2011-02-08 ENCOUNTER — Encounter: Payer: Self-pay | Admitting: Internal Medicine

## 2011-02-08 ENCOUNTER — Ambulatory Visit: Payer: BC Managed Care – PPO | Admitting: Internal Medicine

## 2011-02-08 DIAGNOSIS — E785 Hyperlipidemia, unspecified: Secondary | ICD-10-CM

## 2011-02-08 DIAGNOSIS — K219 Gastro-esophageal reflux disease without esophagitis: Secondary | ICD-10-CM

## 2011-02-08 DIAGNOSIS — M949 Disorder of cartilage, unspecified: Secondary | ICD-10-CM

## 2011-02-08 DIAGNOSIS — R000 Tachycardia, unspecified: Secondary | ICD-10-CM

## 2011-02-08 DIAGNOSIS — E039 Hypothyroidism, unspecified: Secondary | ICD-10-CM

## 2011-02-08 NOTE — Progress Notes (Signed)
Subjective:    Patient ID: Holly Cochran, female    DOB: [birth year], 61 y.o.   MRN: [id number]  HPI Thyroid function monitor  Medications status(change in dose/brand/mode of administration):no Constitutional: Weight change: no; Fatigue:no;  Appetite:good Visual change(blurred/diplopia/visual loss):no Hoarseness:no Cardiovascular: see below GI: Constipation:no; Diarrhea:no Derm: Change in nails/hair/skin:no Neuro: Numbness/tingling:no; Tremor:no Psych: Anxiety:no; Depression:no Endo: Temperature intolerance: Heat:yes; Cold:no   Dyslipidemia assessment: Prior Advanced Lipid Testing: NMR 2006.   Family history of premature CAD/ MI: no  .  Nutrition: no plan .  Exercise: no . Diabetes : no . HTN: no. Smoking history  :never .   ROS: chest pain : no ;claudication: no; myalgias:no;  syncope : no ; memory loss: no   Her reflux symptoms are controlled with ranitidine 150 mg once a day only. She specifically denies swallowing issues, dysphasia, abdominal pain, unexplained weight loss, melena, or rectal bleeding.    Review of Systems   Cardiologist prescribed prednisone for approximately one week recently for upper respiratory infection. She experienced tachycardia and insomnia on this medication. She does have a past history of candidiasis orally.     Objective:   Physical Exam Gen.: Healthy and well-nourished in appearance. Alert, appropriate and cooperative throughout exam. Head: Normocephalic without obvious abnormalities  Eyes: No corneal or conjunctival inflammation noted. Pupils equal round reactive to light and accommodation.  Extraocular motion intact. No lid lag or proptosis Nose: External nasal exam reveals no deformity or inflammation. Nasal mucosa are pink and moist. No lesions or exudates noted.  Mouth: Oral mucosa and oropharynx reveal no lesions or exudates. Teeth in good repair. Neck: No deformities, masses, or tenderness noted.  Thyroid normal Lungs: Normal  respiratory effort; chest expands symmetrically. Lungs are clear to auscultation without rales, wheezes, or increased work of breathing. Heart: Normal rate and rhythm. Normal S1 and S2. No gallop, click, or rub. S 4 with slurring; no murmur. Abdomen: Bowel sounds normal; abdomen soft and nontender. No masses, organomegaly or hernias noted. Genitalia: Dr Eda Paschal   .                                                                                   Musculoskeletal/extremities: No deformity or scoliosis noted of  the thoracic or lumbar spine. No clubbing, cyanosis, edema, or deformity noted. Range of motion  normal .Tone & strength  normal.Joints normal. Nail health  good. Vascular: Carotid, radial artery, dorsalis pedis and  posterior tibial pulses are full and equal. No bruits present. Neurologic: Alert and oriented x3. Deep tendon reflexes symmetrical and normal.          Skin: Intact without suspicious lesions or rashes. Lymph: No cervical, axillary lymphadenopathy present. Psych: Mood and affect are normal. Normally interactive  Assessment & Plan:  #1 subjective tachycardia context of oral steroids. It's possible that prednisone may have impacted her electrolytes.  EKG reveals a rate of 92; no ST-T changes.

## 2011-02-08 NOTE — Assessment & Plan Note (Signed)
Fasting lipids will be checked. Nutritional interventions were provided

## 2011-02-08 NOTE — Patient Instructions (Signed)
The most common cause of elevated triglycerides is the ingestion of sugar from high fructose corn syrup sources added to processed foods & drinks.  Eat a low-fat diet with lots of fruits and vegetables, up to 7-9 servings per day. Consume less than 30 (preferably ZERO) grams of sugar per day from foods & drinks with High Fructose Corn Syrup (HFCS) sugar as #1,2,3 or # 4 on label.Whole Foods, [employer] Jo[employer] & [employer] do not carry products with HFCS. Follow a  low carb nutrition program such as [place] or The New Sugar Busters  to prevent Diabetes progression . White carbohydrates (potatoes, rice, bread, and pasta) have a high spike of sugar and a high load of sugar. For example a  baked potato has a cup of sugar and a  french fry  2 teaspoons of sugar. Yams, wild  rice, whole grained bread &  wheat pasta have been much lower spike and load of  sugar. Portions should be the size of a deck of cards or your palm.  Please  schedule fasting Labs : BMET,Lipids, hepatic panel, CBC & dif, TSH. PLEASE BRING THESE INSTRUCTIONS TO FOLLOW UP  LAB APPOINTMENT.This will guarantee correct labs are drawn, eliminating need for repeat blood sampling ( needle sticks ! ). Diagnoses /Codes:272.4, 244.9, 530.81, 995.20

## 2011-02-08 NOTE — Assessment & Plan Note (Signed)
Bone density will be ordered in addition to vitamin D level.

## 2011-02-08 NOTE — Assessment & Plan Note (Signed)
By history and exam; she appears euthyroid. TSH will be ordered

## 2011-02-15 ENCOUNTER — Other Ambulatory Visit: Payer: Self-pay | Admitting: Internal Medicine

## 2011-02-15 DIAGNOSIS — Z0000 Encounter for general adult medical examination without abnormal findings: Secondary | ICD-10-CM

## 2011-02-16 ENCOUNTER — Other Ambulatory Visit (INDEPENDENT_AMBULATORY_CARE_PROVIDER_SITE_OTHER): Payer: BC Managed Care – PPO

## 2011-02-16 DIAGNOSIS — Z0000 Encounter for general adult medical examination without abnormal findings: Secondary | ICD-10-CM

## 2011-02-16 LAB — HEPATIC FUNCTION PANEL
ALT: 26 U/L (ref 0–35)
AST: 25 U/L (ref 0–37)
Albumin: 3.9 g/dL (ref 3.5–5.2)
Alkaline Phosphatase: 75 U/L (ref 39–117)
Total Protein: 6.9 g/dL (ref 6.0–8.3)

## 2011-02-16 LAB — LIPID PANEL
Cholesterol: 115 mg/dL (ref 0–200)
HDL: 29.9 mg/dL — ABNORMAL LOW (ref >39.00)
LDL Cholesterol: 61 mg/dL (ref 0–99)
Triglycerides: 122 mg/dL (ref 0.0–149.0)
VLDL: 24.4 mg/dL (ref 0.0–40.0)

## 2011-02-16 LAB — CBC WITH DIFFERENTIAL/PLATELET
Basophils Absolute: 0 10*3/uL (ref 0.0–0.1)
Eosinophils Relative: 3.4 % (ref 0.0–5.0)
HCT: 40.8 % (ref 36.0–46.0)
Lymphocytes Relative: 36.6 % (ref 12.0–46.0)
Lymphs Abs: 1.7 10*3/uL (ref 0.7–4.0)
Monocytes Relative: 10.9 % (ref 3.0–12.0)
Neutrophils Relative %: 48.5 % (ref 43.0–77.0)
Platelets: 310 10*3/uL (ref 150.0–400.0)
RDW: 13.2 % (ref 11.5–14.6)
WBC: 4.5 10*3/uL (ref 4.5–10.5)

## 2011-02-16 LAB — BASIC METABOLIC PANEL
Chloride: 109 mEq/L (ref 96–112)
Potassium: 4 mEq/L (ref 3.5–5.1)
Sodium: 140 mEq/L (ref 135–145)

## 2011-02-16 LAB — TSH: TSH: 1.24 u[IU]/mL (ref 0.35–5.50)

## 2011-02-22 ENCOUNTER — Encounter: Payer: Self-pay | Admitting: Internal Medicine

## 2011-03-05 ENCOUNTER — Other Ambulatory Visit: Payer: Self-pay | Admitting: Internal Medicine

## 2011-03-29 ENCOUNTER — Other Ambulatory Visit: Payer: Self-pay | Admitting: Internal Medicine

## 2011-03-29 NOTE — Telephone Encounter (Signed)
Refill done.  

## 2011-03-30 ENCOUNTER — Other Ambulatory Visit: Payer: Self-pay | Admitting: Internal Medicine

## 2011-03-30 ENCOUNTER — Encounter: Payer: Self-pay | Admitting: Internal Medicine

## 2011-04-24 ENCOUNTER — Other Ambulatory Visit: Payer: Self-pay | Admitting: Internal Medicine

## 2011-04-24 NOTE — Telephone Encounter (Signed)
Fax received from [facility] [phone] [place] patient is requesting 180/each (90-Day supply) Please review & re-send updated prescription to [phone]

## 2011-04-25 ENCOUNTER — Encounter: Payer: Self-pay | Admitting: Internal Medicine

## 2011-04-25 MED ORDER — RANITIDINE HCL 150 MG PO TABS: 150.0000 mg | ORAL_TABLET | Freq: Two times a day (BID) | ORAL | Status: DC

## 2011-04-25 NOTE — Telephone Encounter (Signed)
RX sent

## 2011-05-16 ENCOUNTER — Telehealth: Payer: Self-pay | Admitting: Gastroenterology

## 2011-05-16 DIAGNOSIS — Z8719 Personal history of other diseases of the digestive system: Secondary | ICD-10-CM

## 2011-05-16 NOTE — Telephone Encounter (Signed)
Spoke with pt and informed her I am waiting for her chart; will call when I get the chart. Pt stated understanding.

## 2011-05-16 NOTE — Telephone Encounter (Signed)
lmom for pt to call back; insufficient info in system so I sent for her chart.

## 2011-05-17 NOTE — Telephone Encounter (Signed)
2015 .. DO IFOB CARDS

## 2011-05-17 NOTE — Telephone Encounter (Signed)
lmom for pt to call back

## 2011-05-17 NOTE — Telephone Encounter (Signed)
COLON 04/29/2003 that was normal and pt was to f/u PRN. OK to assume recall is 04/28/2013? I have the chart if you need it. Thanks.

## 2011-05-18 NOTE — Telephone Encounter (Signed)
Informed pt of recall due in 2015, but Dr Jarold Motto would like for her to do stool cards d/t hx of rectal bleeding; pt stated understanding.

## 2011-06-01 ENCOUNTER — Encounter: Payer: Self-pay | Admitting: Internal Medicine

## 2011-06-01 DIAGNOSIS — Z780 Asymptomatic menopausal state: Secondary | ICD-10-CM

## 2011-06-01 DIAGNOSIS — M8580 Other specified disorders of bone density and structure, unspecified site: Secondary | ICD-10-CM

## 2011-06-20 ENCOUNTER — Other Ambulatory Visit: Payer: Self-pay | Admitting: Physician Assistant

## 2011-06-20 ENCOUNTER — Inpatient Hospital Stay: Admission: RE | Admit: 2011-06-20 | Payer: BC Managed Care – PPO | Source: Ambulatory Visit

## 2011-06-21 ENCOUNTER — Telehealth: Payer: Self-pay | Admitting: *Deleted

## 2011-06-21 ENCOUNTER — Ambulatory Visit (INDEPENDENT_AMBULATORY_CARE_PROVIDER_SITE_OTHER)
Admission: RE | Admit: 2011-06-21 | Discharge: 2011-06-21 | Disposition: A | Discharge status: Home / Self Care | Payer: BC Managed Care – PPO | Source: Ambulatory Visit

## 2011-06-21 DIAGNOSIS — M949 Disorder of cartilage, unspecified: Secondary | ICD-10-CM

## 2011-06-21 DIAGNOSIS — M8580 Other specified disorders of bone density and structure, unspecified site: Secondary | ICD-10-CM

## 2011-06-21 DIAGNOSIS — Z780 Asymptomatic menopausal state: Secondary | ICD-10-CM

## 2011-06-21 NOTE — Telephone Encounter (Signed)
On 05/16/11, pt was requested to do IFOB cards instead of a COLON; our records show she hasn't picked up the cards. lmom for pt to call back.

## 2011-06-22 ENCOUNTER — Telehealth: Payer: Self-pay | Admitting: Gastroenterology

## 2011-06-26 NOTE — Telephone Encounter (Signed)
Pt lmom she picked up the cards.

## 2011-06-26 NOTE — Telephone Encounter (Signed)
Pt picked up IFOB cards.

## 2011-06-28 ENCOUNTER — Other Ambulatory Visit: Payer: BC Managed Care – PPO

## 2011-06-28 DIAGNOSIS — Z8719 Personal history of other diseases of the digestive system: Secondary | ICD-10-CM

## 2011-08-24 ENCOUNTER — Ambulatory Visit (INDEPENDENT_AMBULATORY_CARE_PROVIDER_SITE_OTHER): Payer: BC Managed Care – PPO | Admitting: Internal Medicine

## 2011-08-24 ENCOUNTER — Ambulatory Visit (INDEPENDENT_AMBULATORY_CARE_PROVIDER_SITE_OTHER): Payer: BC Managed Care – PPO | Admitting: Obstetrics and Gynecology

## 2011-08-24 ENCOUNTER — Encounter: Payer: Self-pay | Admitting: Internal Medicine

## 2011-08-24 ENCOUNTER — Encounter: Payer: Self-pay | Admitting: Obstetrics and Gynecology

## 2011-08-24 VITALS — BP 130/74 | Ht 65.0 in | Wt 215.0 lb

## 2011-08-24 VITALS — BP 130/80 | HR 102 | Temp 98.5°F | Wt 214.4 lb

## 2011-08-24 DIAGNOSIS — K219 Gastro-esophageal reflux disease without esophagitis: Secondary | ICD-10-CM

## 2011-08-24 DIAGNOSIS — Z01419 Encounter for gynecological examination (general) (routine) without abnormal findings: Secondary | ICD-10-CM

## 2011-08-24 MED ORDER — FLUTICASONE PROPIONATE 50 MCG/ACT NA SUSP: 2.0000 | Freq: Every day | NASAL | Status: DC

## 2011-08-24 NOTE — Assessment & Plan Note (Signed)
Ranitidine before breakfast and evening meal is recommended for 6-8 weeks to heal any associated gastritis. Triggers for reflux discussed

## 2011-08-24 NOTE — Patient Instructions (Signed)
Continue yearly mammograms 

## 2011-08-24 NOTE — Patient Instructions (Addendum)
The triggers for reflux  include stress; the "aspirin family" ; alcohol; peppermint; and caffeine (coffee, tea, cola, and chocolate). The aspirin family would include aspirin and the nonsteroidal agents such as ibuprofen &  Naproxen. Tylenol would not cause reflux. If having symptoms ; food & drink should be avoided for @ least 2 hours before going to bed.   If you activate My Chart; the results can be released to you as soon as they populate from the lab. If you choose not to use this program; the labs have to be reviewed, copied & mailed   causing a delay in getting the results to you.  

## 2011-08-24 NOTE — Progress Notes (Signed)
  Subjective:    Patient ID: Holly Cochran, female    DOB: July 09, 1950, 61 y.o.   MRN: [id number]  HPI 7-10 days ago she began to notice a "globus" sensation in the throat, typically after meals and at night. This would last minutes to hours. She started taking ranitidine twice a day with improvement. Other than lifestyle stresses &  81 mg of aspirin each evening she denies any of the other triggers for reflux.  She has never had an upper endoscopy    Review of Systems She denies associated hoarseness; anorexia; difficulty swallowing; abdominal pain;unexplained weight loss; melena or rectal bleeding.      Objective:   Physical Exam General appearance is one of good health and nourishment w/o distress.  Eyes: No conjunctival inflammation or scleral icterus is present.  Oral exam: Dental hygiene is good; lips and gums are healthy appearing.There is no oropharyngeal erythema or exudate noted.   Heart:  Normal rate and regular rhythm.Accentuated  S1 . S2 normal without gallop, murmur, click, rub or other extra sounds     Lungs:Chest clear to auscultation; no wheezes, rhonchi,rales ,or rubs present.No increased work of breathing.   Abdomen: bowel sounds normal, soft and non-tender without masses, organomegaly or hernias noted.  No guarding or rebound   Skin:Warm & dry.  Intact without suspicious lesions or rashes ; no jaundice or tenting  Lymphatic: No lymphadenopathy is noted about the head, neck, axilla areas.              Assessment & Plan:

## 2011-08-24 NOTE — Progress Notes (Signed)
Patient came to see me today for her annual GYN exam. She does well without hormone replacement without menopausal symptoms. She is having no vaginal bleeding. She is having no pelvic pain. She is to have a mammogram today. She is on drug holiday from Fosamax and her bone density vision years showed minimal osteopenia. She has had no fractures. She does her lab through PCP. She is having no urinary symptoms. She has always had normal Pap smears. She had a normal Pap smear in 2012. She is not sexually active.  Physical examination: kim Gardner present HEENT within normal limits. Neck: Thyroid not large. No masses. Supraclavicular nodes: not enlarged. Breasts: Examined in both sitting and lying  position. No skin changes and no masses. Abdomen: Soft no guarding rebound or masses or hernia. Pelvic: External: Within normal limits. BUS: Within normal limits. Vaginal:within normal limits. poor estrogen effect. No evidence of cystocele rectocele or enterocele. Cervix: clean. Uterus: Normal size and shape. Adnexa: No masses. Rectovaginal exam: Confirmatory and negative. Extremities: Within normal limits.  Assessment: #1. Atrophic vaginitis-asymptomatic #2. Osteopenia on drug holiday  Plan: Mammogram. Continue periodic bone densities.The new Pap smear guidelines were discussed with the patient. No pap done.

## 2011-08-25 ENCOUNTER — Encounter: Payer: Self-pay | Admitting: Obstetrics and Gynecology

## 2011-08-25 LAB — URINALYSIS W MICROSCOPIC + REFLEX CULTURE
Bacteria, UA: NONE SEEN
Glucose, UA: NEGATIVE mg/dL
Hgb urine dipstick: NEGATIVE
Leukocytes, UA: NEGATIVE
Protein, ur: NEGATIVE mg/dL

## 2011-09-24 ENCOUNTER — Other Ambulatory Visit: Payer: Self-pay | Admitting: Internal Medicine

## 2011-10-09 ENCOUNTER — Encounter: Payer: Self-pay | Admitting: Internal Medicine

## 2011-10-17 ENCOUNTER — Other Ambulatory Visit: Payer: Self-pay

## 2011-10-17 ENCOUNTER — Other Ambulatory Visit: Payer: Self-pay | Admitting: Internal Medicine

## 2011-10-17 MED ORDER — LEVOTHYROXINE SODIUM 100 MCG PO TABS: ORAL_TABLET | ORAL | Status: DC

## 2011-10-17 NOTE — Telephone Encounter (Signed)
Per patient request, please send rx to [facility] for [facility] does not have the Brand of medication she needs

## 2011-10-17 NOTE — Telephone Encounter (Signed)
rx sent

## 2012-01-23 ENCOUNTER — Other Ambulatory Visit: Payer: Self-pay | Admitting: Internal Medicine

## 2012-01-23 DIAGNOSIS — E785 Hyperlipidemia, unspecified: Secondary | ICD-10-CM

## 2012-01-23 DIAGNOSIS — T887XXA Unspecified adverse effect of drug or medicament, initial encounter: Secondary | ICD-10-CM

## 2012-01-23 MED ORDER — PRAVASTATIN SODIUM 40 MG PO TABS: ORAL_TABLET | ORAL | Status: DC

## 2012-01-23 NOTE — Telephone Encounter (Signed)
refill Pravastatin (Tab) 40 MG TAKE ONE TABLET BY MOUTH NIGHTLY AT BEDTIME #90 last fill 10.8.13

## 2012-01-23 NOTE — Telephone Encounter (Signed)
RX sent  Side note: labs due, future order placed

## 2012-02-24 ENCOUNTER — Other Ambulatory Visit: Payer: Self-pay

## 2012-03-25 ENCOUNTER — Encounter: Payer: Self-pay | Admitting: Internal Medicine

## 2012-03-27 ENCOUNTER — Other Ambulatory Visit: Payer: Self-pay | Admitting: Internal Medicine

## 2012-03-27 DIAGNOSIS — T887XXA Unspecified adverse effect of drug or medicament, initial encounter: Secondary | ICD-10-CM

## 2012-03-27 DIAGNOSIS — E039 Hypothyroidism, unspecified: Secondary | ICD-10-CM

## 2012-03-27 DIAGNOSIS — Z0000 Encounter for general adult medical examination without abnormal findings: Secondary | ICD-10-CM

## 2012-03-27 DIAGNOSIS — M8580 Other specified disorders of bone density and structure, unspecified site: Secondary | ICD-10-CM

## 2012-03-27 DIAGNOSIS — E785 Hyperlipidemia, unspecified: Secondary | ICD-10-CM

## 2012-03-27 NOTE — Telephone Encounter (Signed)
Patient would like to have labs done on 4/15. Can you place orders? I advised her that her insurance may not cover and she would have to contact them. She has appt to discuss results on 4/21. 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

## 2012-04-12 ENCOUNTER — Other Ambulatory Visit: Payer: Self-pay | Admitting: Internal Medicine

## 2012-04-23 ENCOUNTER — Other Ambulatory Visit: Payer: BC Managed Care – PPO

## 2012-04-29 ENCOUNTER — Other Ambulatory Visit (INDEPENDENT_AMBULATORY_CARE_PROVIDER_SITE_OTHER): Payer: BC Managed Care – PPO

## 2012-04-29 ENCOUNTER — Encounter: Payer: Self-pay | Admitting: Lab

## 2012-04-29 ENCOUNTER — Ambulatory Visit: Payer: BC Managed Care – PPO | Admitting: Internal Medicine

## 2012-04-29 DIAGNOSIS — E039 Hypothyroidism, unspecified: Secondary | ICD-10-CM

## 2012-04-29 DIAGNOSIS — M8580 Other specified disorders of bone density and structure, unspecified site: Secondary | ICD-10-CM

## 2012-04-29 DIAGNOSIS — E785 Hyperlipidemia, unspecified: Secondary | ICD-10-CM

## 2012-04-29 DIAGNOSIS — Z0000 Encounter for general adult medical examination without abnormal findings: Secondary | ICD-10-CM

## 2012-04-29 DIAGNOSIS — T887XXA Unspecified adverse effect of drug or medicament, initial encounter: Secondary | ICD-10-CM

## 2012-04-29 LAB — HEPATIC FUNCTION PANEL
ALT: 30 U/L (ref 0–35)
AST: 29 U/L (ref 0–37)
Albumin: 4 g/dL (ref 3.5–5.2)
Alkaline Phosphatase: 86 U/L (ref 39–117)
Total Protein: 7.2 g/dL (ref 6.0–8.3)

## 2012-04-29 LAB — CBC WITH DIFFERENTIAL/PLATELET
Basophils Absolute: 0 10*3/uL (ref 0.0–0.1)
Basophils Relative: 0.7 % (ref 0.0–3.0)
Eosinophils Absolute: 0.1 10*3/uL (ref 0.0–0.7)
HCT: 42.3 % (ref 36.0–46.0)
Hemoglobin: 14.3 g/dL (ref 12.0–15.0)
Lymphs Abs: 1.8 10*3/uL (ref 0.7–4.0)
MCHC: 33.7 g/dL (ref 30.0–36.0)
Neutro Abs: 3.2 10*3/uL (ref 1.4–7.7)
RBC: 4.55 Mil/uL (ref 3.87–5.11)
RDW: 12.6 % (ref 11.5–14.6)

## 2012-04-29 LAB — BASIC METABOLIC PANEL
CO2: 29 mEq/L (ref 19–32)
Chloride: 105 mEq/L (ref 96–112)
Glucose, Bld: 84 mg/dL (ref 70–99)
Potassium: 4.6 mEq/L (ref 3.5–5.1)
Sodium: 139 mEq/L (ref 135–145)

## 2012-04-29 LAB — LIPID PANEL
Cholesterol: 105 mg/dL (ref 0–200)
LDL Cholesterol: 52 mg/dL (ref 0–99)
Triglycerides: 154 mg/dL — ABNORMAL HIGH (ref 0.0–149.0)

## 2012-05-02 ENCOUNTER — Ambulatory Visit: Payer: BC Managed Care – PPO | Admitting: Internal Medicine

## 2012-05-02 LAB — VITAMIN D 1,25 DIHYDROXY: Vitamin D2 1, 25 (OH)2: <8 pg/mL

## 2012-05-09 ENCOUNTER — Ambulatory Visit (INDEPENDENT_AMBULATORY_CARE_PROVIDER_SITE_OTHER): Payer: BC Managed Care – PPO | Admitting: Internal Medicine

## 2012-05-09 ENCOUNTER — Encounter: Payer: Self-pay | Admitting: Internal Medicine

## 2012-05-09 VITALS — BP 128/84 | HR 88 | Wt 210.0 lb

## 2012-05-09 DIAGNOSIS — E039 Hypothyroidism, unspecified: Secondary | ICD-10-CM

## 2012-05-09 DIAGNOSIS — E785 Hyperlipidemia, unspecified: Secondary | ICD-10-CM

## 2012-05-09 MED ORDER — LEVOTHYROXINE SODIUM 125 MCG PO TABS: ORAL_TABLET | ORAL | Status: DC

## 2012-05-09 NOTE — Progress Notes (Signed)
  Subjective:    Patient ID: Holly Cochran, female    DOB: [birth year], 62 y.o.   MRN: [id number]  HPI  She is here to discuss her most recent labs and assess present medication regimen.  Her LDL is incredibly good with a value of 52; prior advanced cholesterol testing revealed that her LDL goal is less than 100, ideally less than 70. Her risk since advanced testing has decreased almost 80%.  Surprisingly her TSH is 8.35. She has not changed the brand, dose, or mode of administration of her thyroid.      Review of Systems She is on a modified heart healthy diet; she exercises as stationary bike > 30 minutes 4 times per week without symptoms. Specifically she denies chest pain, palpitations, dyspnea, or claudication. Family history is negative for premature coronary disease .There is medication compliance with the statin. Significant abdominal symptoms, memory deficit, or myalgias denied.  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 This is very demanding and stressful and has disturbed sleep.generic Lexapro has ben a beneficial tool.  She denies any significant vision changes ; she has lost 6 #. .     Objective:   Physical Exam Gen.:  well-nourished in appearance. Alert, appropriate and cooperative throughout exam. Head: Normocephalic without obvious abnormalities  Eyes: No corneal or conjunctival inflammation noted. No lid lag or proptosis Nose: External nasal exam reveals no deformity or inflammation. Nasal mucosa are pink and moist. No lesions or exudates noted.  Mouth: Oral mucosa and oropharynx reveal no lesions or exudates. Teeth in good repair. Neck: No deformities, masses, or tenderness noted. Range of motion & Thyroid normal. Lungs: Normal respiratory effort; chest expands symmetrically. Lungs are clear to auscultation without rales, wheezes, or increased work of breathing. Heart: Normal rate and rhythm. Normal S1 and S2. No  gallop, click, or rub. S4 w/o murmur. Abdomen: Bowel sounds normal; abdomen soft and nontender. No masses, organomegaly or hernias noted.                                 Musculoskeletal/extremities: Minimally accentuated curvature of upper thoracic spine. No clubbing, cyanosis, edema, or significant extremity  deformity noted. Range of motion normal .Tone & strength  Normal. Joints reveal minor isolated DJD changes . Nail health good. Vascular: Carotid, radial artery, dorsalis pedis and  posterior tibial pulses are full and equal. No bruits present. Neurologic: Alert and oriented x3. Deep tendon reflexes symmetrical and normal.        Skin: Intact without suspicious lesions or rashes. Lymph: No cervical, axillary lymphadenopathy present. Psych: Mood and affect are normal. Normally interactive                                                                                        Assessment & Plan:

## 2012-05-09 NOTE — Assessment & Plan Note (Signed)
Thyroid supplement will be increased to 125 mcg a day with repeat TSH in 10-12 weeks.

## 2012-05-09 NOTE — Patient Instructions (Addendum)
The most common cause of elevated triglycerides (TG) is the ingestion of sugar from high fructose corn syrup sources added to processed foods & drinks.  Eat a low-fat diet with lots of fruits and vegetables, up to 7-9 servings per day. Consume less than  30  Grams (preferably ZERO) of sugar per day from foods & drinks with High Fructose Corn Syrup (HFCS) sugar as #1,2,3 or # 4 on label.Whole Foods, [employer] & [employer] do not carry products with HFCS. Follow a  low carb nutrition program such as [place] or The New Sugar Busters  to prevent Diabetes. Interventions to raise HDL or GOOD cholesterol include: exercising 30-45 minutes 3-4 X per week; including salmon & tuna in the diet;  & supplementing with Omega 3 fatty acids (Flax Seed or Fish oil )  1-2 grams per day.The B vitamin Niacin also raises HDL but has a vasodilating effect which may cause flushing.To date extensive trials have NOT proven benefit of attempting to raise HDL with Niacin.There is also concern about possible negative prostate health issues in men taking Fish Oil supplements . Please  schedule repeat  TSH in 10-12 weeks.PLEASE BRING THESE INSTRUCTIONS TO FOLLOW UP  LAB APPOINTMENT.This will guarantee correct labs are drawn, eliminating need for repeat blood sampling ( needle sticks ! ). Diagnoses /Codes: 244.9

## 2012-05-09 NOTE — Assessment & Plan Note (Signed)
Lipids are excellent except for a chronically low HDL. No change will be made unless the pravastatin cost accelerates. In that case simvastatin or atorvastatin 20 mg will be prescribed

## 2012-05-27 ENCOUNTER — Other Ambulatory Visit: Payer: Self-pay | Admitting: Internal Medicine

## 2012-06-20 ENCOUNTER — Encounter: Payer: Self-pay | Admitting: Internal Medicine

## 2012-06-20 DIAGNOSIS — E039 Hypothyroidism, unspecified: Secondary | ICD-10-CM

## 2012-06-21 ENCOUNTER — Encounter: Payer: Self-pay | Admitting: Internal Medicine

## 2012-06-21 MED ORDER — LEVOTHYROXINE SODIUM 125 MCG PO TABS: ORAL_TABLET | ORAL | Status: DC

## 2012-06-21 NOTE — Telephone Encounter (Signed)
RX sent

## 2012-06-27 ENCOUNTER — Other Ambulatory Visit: Payer: Self-pay | Admitting: Internal Medicine

## 2012-07-21 ENCOUNTER — Other Ambulatory Visit: Payer: Self-pay | Admitting: Internal Medicine

## 2012-08-15 ENCOUNTER — Other Ambulatory Visit (INDEPENDENT_AMBULATORY_CARE_PROVIDER_SITE_OTHER): Payer: BC Managed Care – PPO

## 2012-08-15 DIAGNOSIS — E039 Hypothyroidism, unspecified: Secondary | ICD-10-CM

## 2012-08-27 ENCOUNTER — Encounter: Payer: Self-pay | Admitting: Gynecology

## 2012-09-28 ENCOUNTER — Other Ambulatory Visit: Payer: Self-pay | Admitting: Internal Medicine

## 2012-10-02 NOTE — Telephone Encounter (Signed)
Med filled.  

## 2012-10-31 ENCOUNTER — Encounter: Payer: Self-pay | Admitting: Internal Medicine

## 2012-11-01 MED ORDER — MONTELUKAST SODIUM 10 MG PO TABS: 10.0000 mg | ORAL_TABLET | Freq: Every day | ORAL | Status: DC

## 2012-11-01 NOTE — Telephone Encounter (Signed)
This med was filled. 

## 2012-11-01 NOTE — Addendum Note (Signed)
Addended by: Jackson Latino on: 11/01/2012 09:25 AM   Modules accepted: Orders

## 2012-11-14 ENCOUNTER — Other Ambulatory Visit: Payer: Self-pay

## 2012-11-30 ENCOUNTER — Other Ambulatory Visit: Payer: Self-pay | Admitting: Internal Medicine

## 2012-12-01 NOTE — Telephone Encounter (Signed)
Ranitidine refilled per protocol 

## 2012-12-11 ENCOUNTER — Other Ambulatory Visit: Payer: Self-pay | Admitting: Internal Medicine

## 2012-12-12 NOTE — Telephone Encounter (Signed)
Levothyroxine refilled per protocol 

## 2012-12-16 ENCOUNTER — Telehealth: Payer: Self-pay | Admitting: Internal Medicine

## 2012-12-16 DIAGNOSIS — E079 Disorder of thyroid, unspecified: Secondary | ICD-10-CM

## 2012-12-16 NOTE — Telephone Encounter (Signed)
Patient called and stated that she is due to have her TSH re-checked. Patient is scheduled to come in on Wednesday. Please advise me with lab orders thanks.

## 2012-12-16 NOTE — Telephone Encounter (Signed)
Orders enacted per last TSH lab check.

## 2012-12-18 ENCOUNTER — Other Ambulatory Visit (INDEPENDENT_AMBULATORY_CARE_PROVIDER_SITE_OTHER): Payer: BC Managed Care – PPO

## 2012-12-18 DIAGNOSIS — R946 Abnormal results of thyroid function studies: Secondary | ICD-10-CM

## 2012-12-18 LAB — TSH: TSH: 0.11 u[IU]/mL — ABNORMAL LOW (ref 0.35–5.50)

## 2012-12-19 ENCOUNTER — Encounter: Payer: Self-pay | Admitting: Internal Medicine

## 2012-12-30 ENCOUNTER — Ambulatory Visit (INDEPENDENT_AMBULATORY_CARE_PROVIDER_SITE_OTHER): Payer: BC Managed Care – PPO | Admitting: Internal Medicine

## 2012-12-30 ENCOUNTER — Encounter: Payer: Self-pay | Admitting: Internal Medicine

## 2012-12-30 VITALS — BP 142/82 | HR 90 | Temp 98.9°F | Wt 212.0 lb

## 2012-12-30 DIAGNOSIS — K219 Gastro-esophageal reflux disease without esophagitis: Secondary | ICD-10-CM

## 2012-12-30 DIAGNOSIS — M899 Disorder of bone, unspecified: Secondary | ICD-10-CM

## 2012-12-30 DIAGNOSIS — E039 Hypothyroidism, unspecified: Secondary | ICD-10-CM

## 2012-12-30 MED ORDER — LEVOTHYROXINE SODIUM 125 MCG PO TABS: 125.0000 ug | ORAL_TABLET | Freq: Every day | ORAL | Status: DC

## 2012-12-30 NOTE — Assessment & Plan Note (Signed)
   Antireflux measures discussed. No change will be made in the ranitidine as she has no symptoms at this time

## 2012-12-30 NOTE — Progress Notes (Signed)
Pre visit review using our clinic review tool, if applicable. No additional management support is needed unless otherwise documented below in the visit note. 

## 2012-12-30 NOTE — Patient Instructions (Addendum)
Labs in 3-4 months Reflux of gastric acid may be asymptomatic as this may occur mainly during sleep.The triggers for reflux  include stress; the "aspirin family" ; alcohol; peppermint; and caffeine (coffee, tea, cola, and chocolate). The aspirin family would include aspirin and the nonsteroidal agents such as ibuprofen &  Naproxen. Tylenol would not cause reflux. If having symptoms ; food & drink should be avoided for @ least 2 hours before going to bed.

## 2012-12-30 NOTE — Assessment & Plan Note (Signed)
Her vitamin D level is therapeutic; it should be monitored annually.  Repeat bone density in July 2015. The TSH must be maintained at a level greater than 0.35; preferred goal is 1-3.

## 2012-12-30 NOTE — Assessment & Plan Note (Addendum)
Thyroid dose will be changed to 125 mcg per day except half a pill on Wednesdays. TSH in 3-4 months. She will be asked to take the generic thyroxine by itself

## 2012-12-30 NOTE — Progress Notes (Signed)
   Subjective:    Patient ID: Holly Cochran, female    DOB: Dec 26, 1950, 62 y.o.   MRN: [id number]  HPI She is here to monitor thyroid status There has been no change in the dose, brand, or mode of administration of thyroid supplement Last TSH   0.11 on a dose of 125 mcg per day of the generic l-thyroxine. She has consistently taking the medication with her generic ranitidine and generic Lexapro. Despite the only change being going to generic there has been some variability in the TSH. 8 months ago it was 8.35 prompting an increase from 100 mcg daily to 125 mcg per day.  Of concern is her osteopenia. She has a T score of -1.4 at the femoral neck. Her mother has osteoporosis and is on a parenteral medication.  She does have GERD which is well controlled with ranitidine. She's never had an upper endoscopy. There is no history of esophageal stricture.    Review of Systems  Pertinent negative or absent signs and symptoms are as follows: Constitutional: No significant change in weight; significant fatigue; sleep disorder; change in appetite. Eye: no blurred, double ,loss of vision Cardiovascular: She does have occasional palpitations or heart irregularity ENT/GI: no constipation; diarrhea;hoarseness;dysphagia; melena, rectal bleeding Derm: no change in nails,hair,skin Neuro: no numbness or tingling; tremor. She describes some intermittent trembling sensation when stressed or after wine. Psych:no anxiety; depression; panic attacks Endo: no temperature intolerance to heat ,cold       Objective:   Physical Exam Gen.:  well-nourished; in no acute distress Eyes: Extraocular motion intact; no lid lag or proptosis ,nystagmus Neck: full ROM; no masses ; thyroid normal  Heart: Normal rhythm and rate without significant murmur, gallop, or extra heart sounds Lungs: Chest clear to auscultation without rales,rales, wheezes Neuro:Deep tendon reflexes are equal and within normal limits; no tremor    Skin: Warm and dry without significant lesions or rashes; all nails painted Lymphatic: no cervical or axillary LA Psych: Normally communicative and interactive; no abnormal mood or affect clinically.         Assessment & Plan:  See Current Assessment & Plan in Problem List under specific Diagnosis

## 2013-01-09 DIAGNOSIS — M8580 Other specified disorders of bone density and structure, unspecified site: Secondary | ICD-10-CM

## 2013-01-09 HISTORY — DX: Other specified disorders of bone density and structure, unspecified site: M85.80

## 2013-01-29 ENCOUNTER — Other Ambulatory Visit: Payer: Self-pay | Admitting: Family Medicine

## 2013-01-29 ENCOUNTER — Ambulatory Visit
Admission: RE | Admit: 2013-01-29 | Discharge: 2013-01-29 | Disposition: A | Discharge status: Home / Self Care | Payer: Worker's Compensation | Source: Ambulatory Visit | Attending: Family Medicine | Admitting: Family Medicine

## 2013-01-29 DIAGNOSIS — W19XXXA Unspecified fall, initial encounter: Secondary | ICD-10-CM

## 2013-01-29 DIAGNOSIS — R52 Pain, unspecified: Secondary | ICD-10-CM

## 2013-03-20 ENCOUNTER — Other Ambulatory Visit (INDEPENDENT_AMBULATORY_CARE_PROVIDER_SITE_OTHER): Payer: BC Managed Care – PPO

## 2013-03-20 ENCOUNTER — Other Ambulatory Visit: Payer: Self-pay | Admitting: Internal Medicine

## 2013-03-20 DIAGNOSIS — E039 Hypothyroidism, unspecified: Secondary | ICD-10-CM

## 2013-03-20 LAB — TSH: TSH: 0.51 u[IU]/mL (ref 0.35–5.50)

## 2013-05-05 ENCOUNTER — Encounter: Payer: Self-pay | Admitting: Internal Medicine

## 2013-05-27 ENCOUNTER — Other Ambulatory Visit: Payer: Self-pay

## 2013-05-27 MED ORDER — PRAVASTATIN SODIUM 40 MG PO TABS: ORAL_TABLET | ORAL | Status: DC

## 2013-06-04 ENCOUNTER — Other Ambulatory Visit: Payer: Self-pay

## 2013-06-04 MED ORDER — RANITIDINE HCL 150 MG PO TABS: ORAL_TABLET | ORAL | Status: DC

## 2013-06-24 ENCOUNTER — Other Ambulatory Visit: Payer: Self-pay

## 2013-06-24 MED ORDER — FLUTICASONE PROPIONATE 50 MCG/ACT NA SUSP: NASAL | Status: DC

## 2013-07-04 ENCOUNTER — Encounter: Payer: Self-pay | Admitting: Internal Medicine

## 2013-09-29 ENCOUNTER — Encounter: Payer: Self-pay | Admitting: Internal Medicine

## 2013-11-26 ENCOUNTER — Other Ambulatory Visit (INDEPENDENT_AMBULATORY_CARE_PROVIDER_SITE_OTHER): Payer: BC Managed Care – PPO

## 2013-11-26 ENCOUNTER — Other Ambulatory Visit: Payer: Self-pay | Admitting: Internal Medicine

## 2013-11-26 ENCOUNTER — Ambulatory Visit (INDEPENDENT_AMBULATORY_CARE_PROVIDER_SITE_OTHER): Payer: BC Managed Care – PPO | Admitting: Internal Medicine

## 2013-11-26 ENCOUNTER — Ambulatory Visit: Payer: Self-pay | Admitting: Internal Medicine

## 2013-11-26 ENCOUNTER — Encounter: Payer: Self-pay | Admitting: Internal Medicine

## 2013-11-26 VITALS — BP 120/86 | HR 73 | Temp 98.1°F | Wt 208.0 lb

## 2013-11-26 DIAGNOSIS — E785 Hyperlipidemia, unspecified: Secondary | ICD-10-CM

## 2013-11-26 DIAGNOSIS — E890 Postprocedural hypothyroidism: Secondary | ICD-10-CM

## 2013-11-26 DIAGNOSIS — M8580 Other specified disorders of bone density and structure, unspecified site: Secondary | ICD-10-CM

## 2013-11-26 DIAGNOSIS — K219 Gastro-esophageal reflux disease without esophagitis: Secondary | ICD-10-CM

## 2013-11-26 LAB — HEPATIC FUNCTION PANEL
ALT: 31 U/L (ref 0–35)
AST: 27 U/L (ref 0–37)
Albumin: 4.2 g/dL (ref 3.5–5.2)
Alkaline Phosphatase: 75 U/L (ref 39–117)
Bilirubin, Direct: 0.2 mg/dL (ref 0.0–0.3)
Total Bilirubin: 0.9 mg/dL (ref 0.2–1.2)
Total Protein: 7.3 g/dL (ref 6.0–8.3)

## 2013-11-26 LAB — VITAMIN D 25 HYDROXY (VIT D DEFICIENCY, FRACTURES): VITD: 36.96 ng/mL (ref 30.00–100.00)

## 2013-11-26 LAB — CBC WITH DIFFERENTIAL/PLATELET
BASOS PCT: 0.5 % (ref 0.0–3.0)
Basophils Absolute: 0 10*3/uL (ref 0.0–0.1)
EOS PCT: 1.3 % (ref 0.0–5.0)
Eosinophils Absolute: 0.1 10*3/uL (ref 0.0–0.7)
HCT: 42.4 % (ref 36.0–46.0)
Hemoglobin: 14.6 g/dL (ref 12.0–15.0)
LYMPHS PCT: 32.5 % (ref 12.0–46.0)
Lymphs Abs: 2 10*3/uL (ref 0.7–4.0)
MCHC: 34.5 g/dL (ref 30.0–36.0)
MCV: 89.4 fl (ref 78.0–100.0)
MONOS PCT: 10 % (ref 3.0–12.0)
Monocytes Absolute: 0.6 10*3/uL (ref 0.1–1.0)
Neutro Abs: 3.5 10*3/uL (ref 1.4–7.7)
Neutrophils Relative %: 55.7 % (ref 43.0–77.0)
Platelets: 356 10*3/uL (ref 150.0–400.0)
RBC: 4.74 Mil/uL (ref 3.87–5.11)
RDW: 12.7 % (ref 11.5–15.5)
WBC: 6.3 10*3/uL (ref 4.0–10.5)

## 2013-11-26 LAB — BASIC METABOLIC PANEL
BUN: 11 mg/dL (ref 6–23)
CHLORIDE: 106 meq/L (ref 96–112)
CO2: 27 mEq/L (ref 19–32)
Calcium: 9.3 mg/dL (ref 8.4–10.5)
Creatinine, Ser: 0.7 mg/dL (ref 0.4–1.2)
GFR: 85.83 mL/min (ref >60.00)
Glucose, Bld: 91 mg/dL (ref 70–99)
Potassium: 4.6 mEq/L (ref 3.5–5.1)
Sodium: 141 mEq/L (ref 135–145)

## 2013-11-26 LAB — TSH: TSH: 0.2 u[IU]/mL — AB (ref 0.35–4.50)

## 2013-11-26 NOTE — Progress Notes (Signed)
Pre visit review using our clinic review tool, if applicable. No additional management support is needed unless otherwise documented below in the visit note. 

## 2013-11-26 NOTE — Assessment & Plan Note (Signed)
BMD Vitamin D level 

## 2013-11-26 NOTE — Progress Notes (Signed)
   Subjective:    Patient ID: Holly Cochran, female    DOB: 1950-03-08, 63 y.o.   MRN: [id number]  HPI She is here to assess active health issues & conditions. PMH, FH, & Social history verified & updated    She has been compliant with her medicines and has no adverse effects.  She is on a modified heart healthy diet & practices yoga. Her weight has come down a few pounds.  She denies myalgias specifically but has had steroid injections in the right hip area in April this year and again in September  by her orthopedist with benefit.  [place] is under [place] Her mom has depression and also has posttraumatic muscle skeletal injuries related to motor vehicle accident and then subsequent fall.  Because of her mom's issues her colonoscopy is overdue; she plans to have it this Summer. She denies any active GI symptoms.  In reference to the thyroid her only symptoms include some hair loss.   Review of Systems  Significant chest pain, palpitations, exertional dyspnea, claudication, paroxysmal nocturnal dyspnea, or edema absent. No  memory loss . Unexplained weight loss, abdominal pain, significant dyspepsia, dysphagia, melena, rectal bleeding, or persistently small caliber stools are denied.       Objective:   Physical Exam  Positive or pertinent findings include: She does have slight ptosis.  General appearance :adequately nourished; in no distress. Eyes: No conjunctival inflammation or scleral icterus is present. Oral exam: Dental hygiene is good. Lips and gums are healthy appearing.There is no oropharyngeal erythema or exudate noted.  Heart:  Normal rate and regular rhythm. S1 and S2 normal without gallop, murmur, click, rub or other extra sounds   Lungs:Chest clear to auscultation; no wheezes, rhonchi,rales ,or rubs present.No increased work of breathing.  Abdomen: bowel sounds normal, soft and non-tender without masses, organomegaly or  hernias noted.  No guarding or rebound. No flank tenderness to percussion. Vascular : all pulses equal ; no bruits present. Skin:Warm & dry.  Intact without suspicious lesions or rashes ; no jaundice or tenting Lymphatic: No lymphadenopathy is noted about the head, neck, axilla            Assessment & Plan:  See Current Assessment & Plan in Problem List under specific Diagnosis

## 2013-11-26 NOTE — Assessment & Plan Note (Signed)
CBC & dif  Anti reflux measures 

## 2013-11-26 NOTE — Patient Instructions (Signed)
Your next office appointment will be determined based upon review of your pending labs & BMD. Those instructions will be transmitted to you through My Chart   Reflux of gastric acid may be asymptomatic as this may occur mainly during sleep.The triggers for reflux  include stress; the "aspirin family" ; alcohol; peppermint; and caffeine (coffee, tea, cola, and chocolate). The aspirin family would include aspirin and the nonsteroidal agents such as ibuprofen &  Naproxen. Tylenol would not cause reflux. If having symptoms ; food & drink should be avoided for @ least 2 hours before going to bed.   The best exercises for the low back include freestyle swimming, stretch aerobics, and yoga.Cybex & Nautilus machines rather than dead weights are better for the back.

## 2013-11-26 NOTE — Assessment & Plan Note (Signed)
NMR Lipids, LFTs, TSH  

## 2013-11-26 NOTE — Assessment & Plan Note (Signed)
TSH 

## 2013-11-28 ENCOUNTER — Encounter: Payer: Self-pay | Admitting: Internal Medicine

## 2013-11-28 LAB — NMR LIPOPROFILE WITH LIPIDS
Cholesterol, Total: 103 mg/dL (ref 100–199)
HDL PARTICLE NUMBER: 22.5 umol/L — AB (ref >=30.5)
HDL Size: 8.3 nm — ABNORMAL LOW (ref >=9.2)
HDL-C: 25 mg/dL — AB (ref >39)
LDL (calc): 52 mg/dL (ref 0–99)
LDL Particle Number: 980 nmol/L (ref <1000)
LDL Size: 20.1 nm (ref >=20.8)
LP-IR Score: 85 — ABNORMAL HIGH (ref <=45)
Large VLDL-P: 5.5 nmol/L — ABNORMAL HIGH (ref <=2.7)
Small LDL Particle Number: 577 nmol/L — ABNORMAL HIGH (ref <=527)
Triglycerides: 130 mg/dL (ref 0–149)
VLDL Size: 57.6 nm — ABNORMAL HIGH (ref <=46.6)

## 2013-11-29 ENCOUNTER — Other Ambulatory Visit: Payer: Self-pay | Admitting: Internal Medicine

## 2013-11-29 DIAGNOSIS — E890 Postprocedural hypothyroidism: Secondary | ICD-10-CM

## 2013-12-03 ENCOUNTER — Telehealth: Payer: Self-pay

## 2013-12-03 DIAGNOSIS — E890 Postprocedural hypothyroidism: Secondary | ICD-10-CM

## 2013-12-03 MED ORDER — LEVOTHYROXINE SODIUM 125 MCG PO TABS: ORAL_TABLET | ORAL | Status: DC

## 2013-12-03 MED ORDER — ESCITALOPRAM OXALATE 20 MG PO TABS: 20.0000 mg | ORAL_TABLET | Freq: Every day | ORAL | Status: DC

## 2013-12-03 MED ORDER — FLUTICASONE PROPIONATE 50 MCG/ACT NA SUSP: NASAL | Status: DC

## 2013-12-03 MED ORDER — RANITIDINE HCL 150 MG PO TABS: ORAL_TABLET | ORAL | Status: DC

## 2013-12-03 MED ORDER — PRAVASTATIN SODIUM 40 MG PO TABS: ORAL_TABLET | ORAL | Status: DC

## 2013-12-03 NOTE — Telephone Encounter (Signed)
Patient called in need of refills of her medications

## 2013-12-24 ENCOUNTER — Encounter: Payer: Self-pay | Admitting: Gynecology

## 2013-12-30 ENCOUNTER — Ambulatory Visit (INDEPENDENT_AMBULATORY_CARE_PROVIDER_SITE_OTHER)
Admission: RE | Admit: 2013-12-30 | Discharge: 2013-12-30 | Disposition: A | Discharge status: Home / Self Care | Payer: BC Managed Care – PPO | Source: Ambulatory Visit | Attending: Internal Medicine | Admitting: Internal Medicine

## 2013-12-30 ENCOUNTER — Other Ambulatory Visit (HOSPITAL_COMMUNITY)
Admission: RE | Admit: 2013-12-30 | Discharge: 2013-12-30 | Disposition: A | Discharge status: Home / Self Care | Payer: BC Managed Care – PPO | Source: Ambulatory Visit | Attending: Gynecology | Admitting: Gynecology

## 2013-12-30 ENCOUNTER — Ambulatory Visit (INDEPENDENT_AMBULATORY_CARE_PROVIDER_SITE_OTHER): Payer: BC Managed Care – PPO | Admitting: Gynecology

## 2013-12-30 ENCOUNTER — Encounter: Payer: Self-pay | Admitting: Gynecology

## 2013-12-30 VITALS — BP 136/84 | Ht 66.0 in | Wt 206.0 lb

## 2013-12-30 DIAGNOSIS — M8580 Other specified disorders of bone density and structure, unspecified site: Secondary | ICD-10-CM

## 2013-12-30 DIAGNOSIS — Z01419 Encounter for gynecological examination (general) (routine) without abnormal findings: Secondary | ICD-10-CM | POA: Insufficient documentation

## 2013-12-30 DIAGNOSIS — Z1151 Encounter for screening for human papillomavirus (HPV): Secondary | ICD-10-CM | POA: Diagnosis present

## 2013-12-30 NOTE — Addendum Note (Signed)
Addended by: Nelva Nay on: 12/30/2013 03:53 PM   Modules accepted: Orders, SmartSet

## 2013-12-30 NOTE — Progress Notes (Signed)
STEPHENIE Cochran Jul 23, 1950 [id number]        63 y.o.  G0P0 for annual exam.  Former patient Dr. Cherylann Cochran. Several issues noted below.  Past medical history,surgical history, problem list, medications, allergies, family history and social history were all reviewed and documented as reviewed in the EPIC chart.  ROS:  Performed with pertinent positives and negatives included in the history, assessment and plan.   Additional significant findings :  none   Exam: Holly Cochran Vitals:   12/30/13 1456  BP: 136/84  Height: 5\' 6"  (1.676 m)  Weight: 206 lb (93.441 kg)   General appearance:  Normal affect, orientation and appearance. Skin: Grossly normal HEENT: Without gross lesions.  No cervical or supraclavicular adenopathy. Thyroid normal.  Lungs:  Clear without wheezing, rales or rhonchi Cardiac: RR, without RMG Abdominal:  Soft, nontender, without masses, guarding, rebound, organomegaly or hernia Breasts:  Examined lying and sitting without masses, retractions, discharge or axillary adenopathy. Pelvic:  Ext/BUS/vagina with generalized atrophic changes  Cervix with atrophic changes. Pap smear/HPV done  Uterus axial to anteverted, normal size, shape and contour, midline and mobile nontender   Adnexa  Without masses or tenderness    Anus and perineum  Normal   Rectovaginal  Normal sphincter tone without palpated masses or tenderness.    Assessment/Plan:  63 y.o. G0P0 female for annual exam.   1. Postmenopausal/atrophic genital changes.  Without significant hot flushes, night sweats, vaginal dryness. Not sexually active. No vaginal bleeding. Continue to monitor. Report any vaginal bleeding. 2. Osteopenia. Patient had DEXA done today. Has not been interpreted yet. I looked at the report and it does appear she has osteopenia at the hips with statistically significant loss from prior DEXA. FRAX calculated 8.7%/0.9%. Had been on Fosamax for approximately 2 years by her history but  reports being off of this for probably 10 years. She will follow up with Dr. Darrol Angel recommendation 3. Pap smear 2012. Pap/HPV today. No history of abnormal Pap smears previously. Plan repeat at 3-5 year interval assuming this Pap smear is normal per current screening guidelines. 4. Mammography do now and I reminded the patient to schedule it and she agrees to do so. SBE monthly reviewed. 5. Colonoscopy due now and she's going to schedule this coming summer when she is off from work. 6. Health maintenance. No routine blood work done as she has this done at her primary physician's office. Follow up 1 year, sooner as needed.     Holly Auerbach MD, 3:15 PM 12/30/2013

## 2013-12-30 NOTE — Patient Instructions (Signed)
You may obtain a copy of any labs that were done today by logging onto MyChart as outlined in the instructions provided with your AVS (after visit summary). The office will not call with normal lab results but certainly if there are any significant abnormalities then we will contact you.   Health Maintenance, Female A healthy lifestyle and preventative care can promote health and wellness.  Maintain regular health, dental, and eye exams.  Eat a healthy diet. Foods like vegetables, fruits, whole grains, low-fat dairy products, and lean protein foods contain the nutrients you need without too many calories. Decrease your intake of foods high in solid fats, added sugars, and salt. Get information about a proper diet from your caregiver, if necessary.  Regular physical exercise is one of the most important things you can do for your health. Most adults should get at least 150 minutes of moderate-intensity exercise (any activity that increases your heart rate and causes you to sweat) each week. In addition, most adults need muscle-strengthening exercises on 2 or more days a week.   Maintain a healthy weight. The body mass index (BMI) is a screening tool to identify possible weight problems. It provides an estimate of body fat based on height and weight. Your caregiver can help determine your BMI, and can help you achieve or maintain a healthy weight. For adults 20 years and older:  A BMI below 18.5 is considered underweight.  A BMI of 18.5 to 24.9 is normal.  A BMI of 25 to 29.9 is considered overweight.  A BMI of 30 and above is considered obese.  Maintain normal blood lipids and cholesterol by exercising and minimizing your intake of saturated fat. Eat a balanced diet with plenty of fruits and vegetables. Blood tests for lipids and cholesterol should begin at age 61 and be repeated every 5 years. If your lipid or cholesterol levels are high, you are over 50, or you are a high risk for heart  disease, you may need your cholesterol levels checked more frequently.Ongoing high lipid and cholesterol levels should be treated with medicines if diet and exercise are not effective.  If you smoke, find out from your caregiver how to quit. If you do not use tobacco, do not start.  Lung cancer screening is recommended for adults aged 33 80 years who are at high risk for developing lung cancer because of a history of smoking. Yearly low-dose computed tomography (CT) is recommended for people who have at least a 30-pack-year history of smoking and are a current smoker or have quit within the past 15 years. A pack year of smoking is smoking an average of 1 pack of cigarettes a day for 1 year (for example: 1 pack a day for 30 years or 2 packs a day for 15 years). Yearly screening should continue until the smoker has stopped smoking for at least 15 years. Yearly screening should also be stopped for people who develop a health problem that would prevent them from having lung cancer treatment.  If you are pregnant, do not drink alcohol. If you are breastfeeding, be very cautious about drinking alcohol. If you are not pregnant and choose to drink alcohol, do not exceed 1 drink per day. One drink is considered to be 12 ounces (355 mL) of beer, 5 ounces (148 mL) of wine, or 1.5 ounces (44 mL) of liquor.  Avoid use of street drugs. Do not share needles with anyone. Ask for help if you need support or instructions about stopping  the use of drugs.  High blood pressure causes heart disease and increases the risk of stroke. Blood pressure should be checked at least every 1 to 2 years. Ongoing high blood pressure should be treated with medicines, if weight loss and exercise are not effective.  If you are 59 to 62 years old, ask your caregiver if you should take aspirin to prevent strokes.  Diabetes screening involves taking a blood sample to check your fasting blood sugar level. This should be done once every 3  years, after age 91, if you are within normal weight and without risk factors for diabetes. Testing should be considered at a younger age or be carried out more frequently if you are overweight and have at least 1 risk factor for diabetes.  Breast cancer screening is essential preventative care for women. You should practice "breast self-awareness." This means understanding the normal appearance and feel of your breasts and may include breast self-examination. Any changes detected, no matter how small, should be reported to a caregiver. Women in their 66s and 30s should have a clinical breast exam (CBE) by a caregiver as part of a regular health exam every 1 to 3 years. After age 101, women should have a CBE every year. Starting at age 100, women should consider having a mammogram (breast X-ray) every year. Women who have a family history of breast cancer should talk to their caregiver about genetic screening. Women at a high risk of breast cancer should talk to their caregiver about having an MRI and a mammogram every year.  Breast cancer gene (BRCA)-related cancer risk assessment is recommended for women who have family members with BRCA-related cancers. BRCA-related cancers include breast, ovarian, tubal, and peritoneal cancers. Having family members with these cancers may be associated with an increased risk for harmful changes (mutations) in the breast cancer genes BRCA1 and BRCA2. Results of the assessment will determine the need for genetic counseling and BRCA1 and BRCA2 testing.  The Pap test is a screening test for cervical cancer. Women should have a Pap test starting at age 57. Between ages 25 and 35, Pap tests should be repeated every 2 years. Beginning at age 37, you should have a Pap test every 3 years as long as the past 3 Pap tests have been normal. If you had a hysterectomy for a problem that was not cancer or a condition that could lead to cancer, then you no longer need Pap tests. If you are  between ages 50 and 76, and you have had normal Pap tests going back 10 years, you no longer need Pap tests. If you have had past treatment for cervical cancer or a condition that could lead to cancer, you need Pap tests and screening for cancer for at least 20 years after your treatment. If Pap tests have been discontinued, risk factors (such as a new sexual partner) need to be reassessed to determine if screening should be resumed. Some women have medical problems that increase the chance of getting cervical cancer. In these cases, your caregiver may recommend more frequent screening and Pap tests.  The human papillomavirus (HPV) test is an additional test that may be used for cervical cancer screening. The HPV test looks for the virus that can cause the cell changes on the cervix. The cells collected during the Pap test can be tested for HPV. The HPV test could be used to screen women aged 44 years and older, and should be used in women of any age  who have unclear Pap test results. After the age of 55, women should have HPV testing at the same frequency as a Pap test.  Colorectal cancer can be detected and often prevented. Most routine colorectal cancer screening begins at the age of 44 and continues through age 20. However, your caregiver may recommend screening at an earlier age if you have risk factors for colon cancer. On a yearly basis, your caregiver may provide home test kits to check for hidden blood in the stool. Use of a small camera at the end of a tube, to directly examine the colon (sigmoidoscopy or colonoscopy), can detect the earliest forms of colorectal cancer. Talk to your caregiver about this at age 86, when routine screening begins. Direct examination of the colon should be repeated every 5 to 10 years through age 13, unless early forms of pre-cancerous polyps or small growths are found.  Hepatitis C blood testing is recommended for all people born from [birth year] through 1964 and any  individual with known risks for hepatitis C.  Practice safe sex. Use condoms and avoid high-risk sexual practices to reduce the spread of sexually transmitted infections (STIs). Sexually active women aged 36 and younger should be checked for Chlamydia, which is a common sexually transmitted infection. Older women with new or multiple partners should also be tested for Chlamydia. Testing for other STIs is recommended if you are sexually active and at increased risk.  Osteoporosis is a disease in which the bones lose minerals and strength with aging. This can result in serious bone fractures. The risk of osteoporosis can be identified using a bone density scan. Women ages 20 and over and women at risk for fractures or osteoporosis should discuss screening with their caregivers. Ask your caregiver whether you should be taking a calcium supplement or vitamin D to reduce the rate of osteoporosis.  Menopause can be associated with physical symptoms and risks. Hormone replacement therapy is available to decrease symptoms and risks. You should talk to your caregiver about whether hormone replacement therapy is right for you.  Use sunscreen. Apply sunscreen liberally and repeatedly throughout the day. You should seek shade when your shadow is shorter than you. Protect yourself by wearing long sleeves, pants, a wide-brimmed hat, and sunglasses year round, whenever you are outdoors.  Notify your caregiver of new moles or changes in moles, especially if there is a change in shape or color. Also notify your caregiver if a mole is larger than the size of a pencil eraser.  Stay current with your immunizations. Document Released: 07/11/2010 Document Revised: 04/22/2012 Document Reviewed: 07/11/2010 [facility] Patient Information 2014 [employer].

## 2013-12-31 LAB — URINALYSIS W MICROSCOPIC + REFLEX CULTURE
BILIRUBIN URINE: NEGATIVE
Bacteria, UA: NONE SEEN
CRYSTALS: NONE SEEN
Casts: NONE SEEN
GLUCOSE, UA: NEGATIVE mg/dL
Hgb urine dipstick: NEGATIVE
KETONES UR: NEGATIVE mg/dL
Leukocytes, UA: NEGATIVE
Nitrite: NEGATIVE
PROTEIN: NEGATIVE mg/dL
SPECIFIC GRAVITY, URINE: 1.011 (ref 1.005–1.030)
Squamous Epithelial / LPF: NONE SEEN
Urobilinogen, UA: 1 mg/dL (ref 0.0–1.0)
pH: 6.5 (ref 5.0–8.0)

## 2014-01-01 LAB — CYTOLOGY - PAP

## 2014-03-25 ENCOUNTER — Encounter: Payer: Self-pay | Admitting: Gynecology

## 2014-04-20 ENCOUNTER — Telehealth: Payer: Self-pay | Admitting: Internal Medicine

## 2014-04-20 DIAGNOSIS — E890 Postprocedural hypothyroidism: Secondary | ICD-10-CM

## 2014-04-20 NOTE — Telephone Encounter (Signed)
On Holly Cochran last visit with Dr. Linna Darner he wanted Holly Cochran to come in to get Holly Cochran thyroid checked and she is needing a lab order for that.

## 2014-04-20 NOTE — Telephone Encounter (Signed)
Order placed

## 2014-04-21 ENCOUNTER — Other Ambulatory Visit: Payer: Self-pay | Admitting: Internal Medicine

## 2014-04-21 ENCOUNTER — Encounter: Payer: Self-pay | Admitting: Internal Medicine

## 2014-04-21 DIAGNOSIS — E890 Postprocedural hypothyroidism: Secondary | ICD-10-CM

## 2014-04-24 ENCOUNTER — Other Ambulatory Visit (INDEPENDENT_AMBULATORY_CARE_PROVIDER_SITE_OTHER): Payer: BC Managed Care – PPO

## 2014-04-24 DIAGNOSIS — E890 Postprocedural hypothyroidism: Secondary | ICD-10-CM | POA: Diagnosis not present

## 2014-04-24 LAB — TSH: TSH: 1.03 u[IU]/mL (ref 0.35–4.50)

## 2014-05-04 ENCOUNTER — Encounter: Payer: Self-pay | Admitting: Internal Medicine

## 2014-05-20 ENCOUNTER — Ambulatory Visit (INDEPENDENT_AMBULATORY_CARE_PROVIDER_SITE_OTHER): Payer: Self-pay | Admitting: Neurology

## 2014-05-20 ENCOUNTER — Encounter: Payer: Self-pay | Admitting: Neurology

## 2014-05-20 ENCOUNTER — Ambulatory Visit (INDEPENDENT_AMBULATORY_CARE_PROVIDER_SITE_OTHER): Payer: BC Managed Care – PPO | Admitting: Neurology

## 2014-05-20 DIAGNOSIS — G5702 Lesion of sciatic nerve, left lower limb: Secondary | ICD-10-CM

## 2014-05-20 DIAGNOSIS — G5732 Lesion of lateral popliteal nerve, left lower limb: Secondary | ICD-10-CM

## 2014-05-20 DIAGNOSIS — M21372 Foot drop, left foot: Secondary | ICD-10-CM

## 2014-05-20 HISTORY — DX: Lesion of sciatic nerve, left lower limb: G57.02

## 2014-05-20 NOTE — Progress Notes (Signed)
Please refer to EMG and nerve conduction study procedure note. 

## 2014-05-20 NOTE — Procedures (Signed)
     HISTORY:  Holly Cochran is a 64 year old patient with a long-standing history of foot drop on the left that dates back to the 1980s. The patient more recently has had some tingling sensations in the lateral aspect of the lower legs below the knee, slightly more prominent on the left than the right. The patient is being evaluated for possible neuropathy or a lumbosacral radiculopathy.  NERVE CONDUCTION STUDIES:  Nerve conduction studies were performed on the left lower extremity. The distal motor latency for the left peroneal nerve was normal, with a low motor amplitude. The distal motor latency and motor amplitude for the left posterior tibial nerve was normal. The nerve conduction velocities for the peroneal and posterior tibial nerves were normal, with a normal left H reflex latency. The left peroneal sensory latency was normal.  EMG STUDIES:  EMG study was performed on the left lower extremity:  The tibialis anterior muscle reveals no voluntary motor units with no recruitment. No fibrillations or positive waves were seen. No electrically active tissue was identified. The peroneus tertius muscle reveals no voluntary motor units with no recruitment. No fibrillations or positive waves were seen. No electrically active tissue was identified. The medial gastrocnemius muscle reveals 1 to 3K motor units with full recruitment. No fibrillations or positive waves were seen. The vastus lateralis muscle reveals 2 to 4K motor units with full recruitment. No fibrillations or positive waves were seen. The iliopsoas muscle reveals 2 to 4K motor units with full recruitment. No fibrillations or positive waves were seen. The biceps femoris muscle (long head) reveals 2 to 4K motor units with full recruitment. No fibrillations or positive waves were seen. The lumbosacral paraspinal muscles were tested at 3 levels, and revealed no abnormalities of insertional activity at all 3 levels tested. There was good  relaxation.   IMPRESSION:  Nerve conduction studies done on the left lower extremity shows evidence of a lowering of motor amplitude for the left peroneal nerve. 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 No evidence of an overlying lumbosacral radiculopathy is seen.  Jill Alexanders MD 05/20/2014 4:48 PM  [facility] [address] [place], [place] [address]  Phone [phone] Fax [phone]

## 2014-06-06 ENCOUNTER — Other Ambulatory Visit: Payer: Self-pay | Admitting: Internal Medicine

## 2014-06-24 ENCOUNTER — Ambulatory Visit (AMBULATORY_SURGERY_CENTER): Payer: Self-pay | Admitting: *Deleted

## 2014-06-24 VITALS — Ht 66.0 in | Wt 201.0 lb

## 2014-06-24 DIAGNOSIS — Z1211 Encounter for screening for malignant neoplasm of colon: Secondary | ICD-10-CM

## 2014-06-24 NOTE — Progress Notes (Signed)
Patient denies any allergies to eggs or soy. Patient denies any problems with anesthesia/sedation. Patient denies any oxygen use at home and does not take any diet/weight loss medications. EMMI education assisgned to patient on colonoscopy, this was explained and instructions given to patient. 

## 2014-06-25 ENCOUNTER — Other Ambulatory Visit (INDEPENDENT_AMBULATORY_CARE_PROVIDER_SITE_OTHER): Payer: BC Managed Care – PPO

## 2014-06-25 ENCOUNTER — Ambulatory Visit (INDEPENDENT_AMBULATORY_CARE_PROVIDER_SITE_OTHER): Payer: BC Managed Care – PPO | Admitting: Internal Medicine

## 2014-06-25 ENCOUNTER — Encounter: Payer: Self-pay | Admitting: Internal Medicine

## 2014-06-25 VITALS — BP 148/82 | HR 106 | Temp 98.2°F | Wt 200.0 lb

## 2014-06-25 DIAGNOSIS — F4329 Adjustment disorder with other symptoms: Secondary | ICD-10-CM | POA: Diagnosis not present

## 2014-06-25 DIAGNOSIS — R300 Dysuria: Secondary | ICD-10-CM

## 2014-06-25 DIAGNOSIS — R000 Tachycardia, unspecified: Secondary | ICD-10-CM | POA: Diagnosis not present

## 2014-06-25 DIAGNOSIS — R682 Dry mouth, unspecified: Secondary | ICD-10-CM

## 2014-06-25 LAB — CBC WITH DIFFERENTIAL/PLATELET
BASOS PCT: 0.4 % (ref 0.0–3.0)
Basophils Absolute: 0 10*3/uL (ref 0.0–0.1)
EOS PCT: 0.6 % (ref 0.0–5.0)
Eosinophils Absolute: 0.1 10*3/uL (ref 0.0–0.7)
HEMATOCRIT: 44.6 % (ref 36.0–46.0)
HEMOGLOBIN: 14.9 g/dL (ref 12.0–15.0)
LYMPHS ABS: 2 10*3/uL (ref 0.7–4.0)
Lymphocytes Relative: 22 % (ref 12.0–46.0)
MCHC: 33.5 g/dL (ref 30.0–36.0)
MCV: 92.6 fl (ref 78.0–100.0)
MONO ABS: 0.9 10*3/uL (ref 0.1–1.0)
Monocytes Relative: 9.6 % (ref 3.0–12.0)
NEUTROS ABS: 6.2 10*3/uL (ref 1.4–7.7)
Neutrophils Relative %: 67.4 % (ref 43.0–77.0)
Platelets: 367 10*3/uL (ref 150.0–400.0)
RBC: 4.81 Mil/uL (ref 3.87–5.11)
RDW: 12.9 % (ref 11.5–15.5)
WBC: 9.2 10*3/uL (ref 4.0–10.5)

## 2014-06-25 LAB — URINALYSIS
Bilirubin Urine: NEGATIVE
Hgb urine dipstick: NEGATIVE
Ketones, ur: NEGATIVE
Leukocytes, UA: NEGATIVE
NITRITE: NEGATIVE
PH: 7 (ref 5.0–8.0)
Specific Gravity, Urine: 1.01 (ref 1.000–1.030)
TOTAL PROTEIN, URINE-UPE24: NEGATIVE
URINE GLUCOSE: NEGATIVE
Urobilinogen, UA: 1 (ref 0.0–1.0)

## 2014-06-25 LAB — BASIC METABOLIC PANEL
BUN: 11 mg/dL (ref 6–23)
CALCIUM: 9.6 mg/dL (ref 8.4–10.5)
CO2: 28 meq/L (ref 19–32)
CREATININE: 0.68 mg/dL (ref 0.40–1.20)
Chloride: 106 mEq/L (ref 96–112)
GFR: 92.98 mL/min (ref >60.00)
Glucose, Bld: 91 mg/dL (ref 70–99)
Potassium: 4.2 mEq/L (ref 3.5–5.1)
SODIUM: 137 meq/L (ref 135–145)

## 2014-06-25 LAB — POCT URINALYSIS DIPSTICK
BILIRUBIN UA: NEGATIVE
Glucose, UA: NEGATIVE
KETONES UA: NEGATIVE
Leukocytes, UA: NEGATIVE
Nitrite, UA: NEGATIVE
PH UA: 6
PROTEIN UA: NEGATIVE
RBC UA: NEGATIVE
Spec Grav, UA: 1.015
Urobilinogen, UA: 0.2

## 2014-06-25 LAB — SEDIMENTATION RATE: Sed Rate: 18 mm/hr (ref 0–22)

## 2014-06-25 LAB — MAGNESIUM: Magnesium: 2.1 mg/dL (ref 1.5–2.5)

## 2014-06-25 LAB — TSH: TSH: 1.87 u[IU]/mL (ref 0.35–4.50)

## 2014-06-25 NOTE — Progress Notes (Signed)
   Subjective:    Patient ID: Holly Cochran, female    DOB: [birth year], 64 y.o.   MRN: [id number]  HPI  She describes 1 month of dry mouth after being exposed to cleaning materials in her house. She's used Biodene rinses and lozenges with moderate benefit.  She also describes 2 episodes of dysuria, frequency, and suprapubic pressure. The pressure actually started 5 weeks ago but she's had the other symptoms for the last 3 weeks. She has not treated this.  She denies constitutional other genitourinary or GI symptoms.  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 She's only been taking Lexapro 20 mg one half pill daily. She does not want to take any sedative medication.  Review of Systems  She denies any swelling, redness, or pain in the joints. Chest pain, palpitations, exertional dyspnea, paroxysmal nocturnal dyspnea, claudication or edema are absent. No unexplained weight loss, abdominal pain, significant dyspepsia, dysphagia, melena, rectal bleeding, or persistently small caliber stools. Pyuria, hematuria,  nocturia or polyuria are denied. Change in hair, skin, nails denied. No bowel changes of constipation or diarrhea. No intolerance to heat or cold.     Objective:   Physical Exam  Pertinent or positive findings include: She exhibits a resting tachycardia. She has minor crepitus of the knees.  General appearance :adequately nourished; in no distress.  Eyes: No conjunctival inflammation or scleral icterus is present.  Oral exam:  Lips and gums are healthy appearing.There is no oropharyngeal erythema or exudate noted. Dental hygiene is good.  Heart:  regular rhythm. S1 and S2 normal without gallop, murmur, click, rub or other extra sounds    Lungs:Chest clear to auscultation; no wheezes, rhonchi,rales ,or rubs present.No increased work of breathing.   Abdomen: bowel sounds normal, soft and non-tender without masses,  organomegaly or hernias noted.  No guarding or rebound. No flank tenderness to percussion.  Vascular : all pulses equal ; no bruits present.  Skin:Warm & dry.  Intact without suspicious lesions or rashes ; no tenting or jaundice   Lymphatic: No lymphadenopathy is noted about the head, neck, axilla   Neuro: Strength, tone & DTRs normal.       Assessment & Plan:  #1 dry mouth; clinically the tongue is moist  #2 tachycardia in the context of stress  #3 dysuria and frequency with normal urinalysis  Plan: See orders and recommendations

## 2014-06-25 NOTE — Patient Instructions (Signed)
Please increase Lexapro to 20 mg daily.  Please have an ophthalmologic exam to rule out dry eyes.

## 2014-06-25 NOTE — Progress Notes (Signed)
Pre visit review using our clinic review tool, if applicable. No additional management support is needed unless otherwise documented below in the visit note. 

## 2014-06-27 LAB — CULTURE, URINE COMPREHENSIVE
COLONY COUNT: NO GROWTH
Organism ID, Bacteria: NO GROWTH

## 2014-07-08 ENCOUNTER — Encounter: Payer: Self-pay | Admitting: Internal Medicine

## 2014-07-08 ENCOUNTER — Ambulatory Visit (AMBULATORY_SURGERY_CENTER): Payer: BC Managed Care – PPO | Admitting: Internal Medicine

## 2014-07-08 VITALS — BP 120/74 | HR 66 | Temp 97.3°F | Resp 13 | Ht 66.0 in | Wt 201.0 lb

## 2014-07-08 DIAGNOSIS — Z1211 Encounter for screening for malignant neoplasm of colon: Secondary | ICD-10-CM

## 2014-07-08 DIAGNOSIS — K648 Other hemorrhoids: Secondary | ICD-10-CM

## 2014-07-08 DIAGNOSIS — K644 Residual hemorrhoidal skin tags: Secondary | ICD-10-CM

## 2014-07-08 MED ORDER — SODIUM CHLORIDE 0.9 % IV SOLN: 500.0000 mL | INTRAVENOUS | Status: DC

## 2014-07-08 NOTE — Patient Instructions (Addendum)
No polyps or cancer. You do have some hemorrhoids. If they bother you frequently and you want I can improve or fix them with an in-office procedure.  Next routine colonoscopy in 10 years - 2026  I appreciate the opportunity to care for you. Gatha Mayer, MD, FACG   YOU HAD AN ENDOSCOPIC PROCEDURE TODAY AT [facility]:   Refer to the procedure report that was given to you for any specific questions about what was found during the examination.  If the procedure report does not answer your questions, please call your gastroenterologist to clarify.  If you requested that your care partner not be given the details of your procedure findings, then the procedure report has been included in a sealed envelope for you to review at your convenience later.  YOU SHOULD EXPECT: Some feelings of bloating in the abdomen. Passage of more gas than usual.  Walking can help get rid of the air that was put into your GI tract during the procedure and reduce the bloating. If you had a lower endoscopy (such as a colonoscopy or flexible sigmoidoscopy) you may notice spotting of blood in your stool or on the toilet paper. If you underwent a bowel prep for your procedure, you may not have a normal bowel movement for a few days.  Please Note:  You might notice some irritation and congestion in your nose or some drainage.  This is from the oxygen used during your procedure.  There is no need for concern and it should clear up in a day or so.  SYMPTOMS TO REPORT IMMEDIATELY:   Following lower endoscopy (colonoscopy or flexible sigmoidoscopy):  Excessive amounts of blood in the stool  Significant tenderness or worsening of abdominal pains  Swelling of the abdomen that is new, acute  Fever of 100F or higher   For urgent or emergent issues, a gastroenterologist can be reached at any hour by calling [phone].   DIET: Your first meal following the procedure should be a small meal and  then it is ok to progress to your normal diet. Heavy or fried foods are harder to digest and may make you feel nauseous or bloated.  Likewise, meals heavy in dairy and vegetables can increase bloating.  Drink plenty of fluids but you should avoid alcoholic beverages for 24 hours.  ACTIVITY:  You should plan to take it easy for the rest of today and you should NOT DRIVE or use heavy machinery until tomorrow (because of the sedation medicines used during the test).    FOLLOW UP: Our staff will call the number listed on your records the next business day following your procedure to check on you and address any questions or concerns that you may have regarding the information given to you following your procedure. If we do not reach you, we will leave a message.  However, if you are feeling well and you are not experiencing any problems, there is no need to return our call.  We will assume that you have returned to your regular daily activities without incident.  If any biopsies were taken you will be contacted by phone or by letter within the next 1-3 weeks.  Please call us at [phone] if you have not heard about the biopsies in 3 weeks.    SIGNATURES/CONFIDENTIALITY: You and/or your care partner have signed paperwork which will be entered into your electronic medical record.  These signatures attest to the fact that that the information  above on your After Visit Summary has been reviewed and is understood.  Full responsibility of the confidentiality of this discharge information lies with you and/or your care-partner. 

## 2014-07-08 NOTE — Op Note (Signed)
[place]  [address] [place], [id number]   COLONOSCOPY PROCEDURE REPORT  PATIENT: Holly Cochran, Holly Cochran  MR#: [id number] BIRTHDATE: September 29, 1950 , 89  yrs. old GENDER: female ENDOSCOPIST: Gatha Mayer, MD, [facility] PROCEDURE DATE:  07/08/2014 PROCEDURE:   Colonoscopy, screening First Screening Colonoscopy - Avg.  risk and is 50 yrs.  old or older - No.  Prior Negative Screening - Now for repeat screening. 10 or more years since last screening  History of Adenoma - Now for follow-up colonoscopy & has been > or = to 3 yrs.  N/A  Polyps removed today? No Recommend repeat exam, <10 yrs? No ASA CLASS:   Class II INDICATIONS:Screening for colonic neoplasia and Colorectal Neoplasm Risk Assessment for this procedure is average risk. MEDICATIONS: Propofol 250 mg IV, Monitored anesthesia care, and Lidocaine 40 mg IV  DESCRIPTION OF PROCEDURE:   After the risks benefits and alternatives of the procedure were thoroughly explained, informed consent was obtained.  The digital rectal exam revealed no abnormalities of the rectum.   The [id number]  endoscope was introduced through the anus and advanced to the cecum, which was identified by both the appendix and ileocecal valve. No adverse events experienced.   The quality of the prep was (MiraLax was used) adequate  The instrument was then slowly withdrawn as the colon was fully examined. Estimated blood loss is zero unless otherwise noted in this procedure report.      COLON FINDINGS: The colonic mucosa appeared normal throughout the entire examined colon.   External and internal hemorrhoids were found.  Retroflexed views revealed internal hemorrhoids and Retroflexed views revealed external hemorrhoids. The time to cecum = 5.7 Withdrawal time = 18.2   The scope was withdrawn and the procedure completed. COMPLICATIONS: There were no immediate complications.  ENDOSCOPIC IMPRESSION: 1.   The colonic mucosa appeared  normal throughout the entire examined colon 2.   External and internal hemorrhoids in rectum  RECOMMENDATIONS: Repeat colonoscopy/CRCA screening test 10 years 2026.  eSigned:  Gatha Mayer, MD, [facility] 07/08/2014 8:33 AM   cc: The Patient

## 2014-07-08 NOTE — Progress Notes (Signed)
Stable to RR 

## 2014-07-09 ENCOUNTER — Telehealth: Payer: Self-pay | Admitting: Emergency Medicine

## 2014-07-09 NOTE — Telephone Encounter (Signed)
Wrong number according to receiver

## 2014-07-17 ENCOUNTER — Encounter: Payer: Self-pay | Admitting: Internal Medicine

## 2014-07-20 ENCOUNTER — Other Ambulatory Visit: Payer: Self-pay | Admitting: Internal Medicine

## 2014-07-20 DIAGNOSIS — R682 Dry mouth, unspecified: Principal | ICD-10-CM | POA: Insufficient documentation

## 2014-07-20 DIAGNOSIS — H04129 Dry eye syndrome of unspecified lacrimal gland: Secondary | ICD-10-CM | POA: Insufficient documentation

## 2014-07-20 DIAGNOSIS — H04123 Dry eye syndrome of bilateral lacrimal glands: Secondary | ICD-10-CM

## 2014-07-20 DIAGNOSIS — K117 Disturbances of salivary secretion: Secondary | ICD-10-CM

## 2014-08-24 ENCOUNTER — Encounter: Payer: Self-pay | Admitting: Internal Medicine

## 2014-09-08 ENCOUNTER — Other Ambulatory Visit: Payer: Self-pay | Admitting: Internal Medicine

## 2014-09-09 ENCOUNTER — Other Ambulatory Visit: Payer: Self-pay | Admitting: Emergency Medicine

## 2014-09-09 MED ORDER — ESCITALOPRAM OXALATE 20 MG PO TABS: 20.0000 mg | ORAL_TABLET | Freq: Every day | ORAL | Status: DC

## 2014-09-15 ENCOUNTER — Other Ambulatory Visit: Payer: Self-pay | Admitting: Emergency Medicine

## 2014-09-15 DIAGNOSIS — E890 Postprocedural hypothyroidism: Secondary | ICD-10-CM

## 2014-09-15 MED ORDER — LEVOTHYROXINE SODIUM 125 MCG PO TABS: ORAL_TABLET | ORAL | Status: DC

## 2014-12-09 ENCOUNTER — Ambulatory Visit (INDEPENDENT_AMBULATORY_CARE_PROVIDER_SITE_OTHER): Payer: BC Managed Care – PPO | Admitting: Internal Medicine

## 2014-12-09 ENCOUNTER — Encounter: Payer: Self-pay | Admitting: Internal Medicine

## 2014-12-09 VITALS — BP 144/98 | HR 83 | Temp 98.4°F | Resp 16 | Ht 66.0 in | Wt 199.8 lb

## 2014-12-09 DIAGNOSIS — H6982 Other specified disorders of Eustachian tube, left ear: Secondary | ICD-10-CM

## 2014-12-09 DIAGNOSIS — E890 Postprocedural hypothyroidism: Secondary | ICD-10-CM

## 2014-12-09 DIAGNOSIS — Z418 Encounter for other procedures for purposes other than remedying health state: Secondary | ICD-10-CM

## 2014-12-09 DIAGNOSIS — Z23 Encounter for immunization: Secondary | ICD-10-CM

## 2014-12-09 DIAGNOSIS — E785 Hyperlipidemia, unspecified: Secondary | ICD-10-CM | POA: Diagnosis not present

## 2014-12-09 DIAGNOSIS — Z299 Encounter for prophylactic measures, unspecified: Secondary | ICD-10-CM

## 2014-12-09 NOTE — Assessment & Plan Note (Signed)
TSH 

## 2014-12-09 NOTE — Progress Notes (Signed)
Pre visit review using our clinic review tool, if applicable. No additional management support is needed unless otherwise documented below in the visit note. 

## 2014-12-09 NOTE — Progress Notes (Signed)
   Subjective:    Patient ID: Holly Cochran, female    DOB: 1950-05-10, 64 y.o.   MRN: [id number]  HPI   She describes pain in the left ear for 10 days. She was seen at the [facility] and prescribed Allegra-D. This was discontinued yesterday because of dry mouth. She has no other upper or lower respiratory tract symptoms except for some sore throat on the left related to postnasal drainage.  She has been compliant with her medications without adverse effects. She has not had lipids or liver function tests done since November 2015. TSH was therapeutic in June of this year.  She is requesting refills of all meds besides ranitidine.    Review of Systems Frontal headache, facial pain , nasal purulence, dental pain, otic  discharge denied. No fever , chills or sweats.  Extrinsic symptoms of itchy, watery eyes, sneezing, or angioedema are denied. There is no significant cough, sputum production, wheezing,or  paroxysmal nocturnal dyspnea.     Objective:   Physical Exam  General appearance:Adequately nourished; no acute distress or increased work of breathing is present.    Lymphatic: No  lymphadenopathy about the head, neck, or axilla .  Eyes: No conjunctival inflammation or lid edema is present. There is no scleral icterus.  Ears:  External ear exam shows no significant lesions or deformities.  Otoscopic examination reveals clear canals, tympanic membranes are intact bilaterally without bulging, retraction, inflammation or discharge.Decreased L TM light reflex  Nose:  External nasal examination shows no deformity or inflammation. Nasal mucosa are dry without lesions or exudates No septal dislocation or deviation.No obstruction to airflow.   Oral exam: Dental hygiene is good; lips and gums are healthy appearing.There is no oropharyngeal erythema or exudate .  Neck:  No deformities, thyromegaly, masses, or tenderness noted.   Supple with full range of motion without pain.   Heart:   Normal rate and regular rhythm. S1 and S2 normal without gallop, murmur, click, rub or other extra sounds.   Lungs:Chest clear to auscultation; no wheezes, rhonchi,rales ,or rubs present.  Extremities:  No cyanosis, edema, or clubbing  noted    Skin: Warm & dry w/o tenting or jaundice. No significant lesions or rash.      Assessment & Plan:  #1 eustachian tube dysfunction  #2 See Current Assessment & Plan in Problem List under specific Diagnosis  Plan: Eustachian tube dysfunction as per protocol in AVS

## 2014-12-09 NOTE — Assessment & Plan Note (Signed)
Lipids, LFTs, TSH  

## 2014-12-09 NOTE — Patient Instructions (Addendum)
Plain Mucinex (NOT D) for thick secretions ;force NON dairy fluids .   Nasal cleansing in the shower as discussed with lather of mild shampoo.After 10 seconds wash off lather while  exhaling through nostrils. Make sure that all residual soap is removed to prevent irritation.  Flonase OR Nasacort AQ 1 spray in each nostril twice a day as needed. Use the "crossover" technique into opposite nostril spraying toward opposite ear @ 45 degree angle, not straight up into nostril.  Plain Allegra (NOT D )  160 daily , Loratidine 10 mg , OR Zyrtec 10 mg @ bedtime  as needed for itchy eyes & sneezing.  Go to [facility] for eustachian tube dysfunction. Drink thin  fluids liberally through the day and chew sugarless gum . Do the Valsalva maneuver several times a day to "pop" ears open. 

## 2014-12-10 ENCOUNTER — Other Ambulatory Visit: Payer: Self-pay | Admitting: Internal Medicine

## 2014-12-10 ENCOUNTER — Other Ambulatory Visit (INDEPENDENT_AMBULATORY_CARE_PROVIDER_SITE_OTHER): Payer: BC Managed Care – PPO

## 2014-12-10 DIAGNOSIS — E890 Postprocedural hypothyroidism: Secondary | ICD-10-CM | POA: Diagnosis not present

## 2014-12-10 DIAGNOSIS — E785 Hyperlipidemia, unspecified: Secondary | ICD-10-CM | POA: Diagnosis not present

## 2014-12-10 LAB — HEPATIC FUNCTION PANEL
ALT: 20 U/L (ref 0–35)
AST: 21 U/L (ref 0–37)
Albumin: 4 g/dL (ref 3.5–5.2)
Alkaline Phosphatase: 91 U/L (ref 39–117)
BILIRUBIN TOTAL: 0.5 mg/dL (ref 0.2–1.2)
Bilirubin, Direct: 0.1 mg/dL (ref 0.0–0.3)
TOTAL PROTEIN: 7.1 g/dL (ref 6.0–8.3)

## 2014-12-10 LAB — LIPID PANEL
CHOLESTEROL: 108 mg/dL (ref 0–200)
HDL: 24 mg/dL — ABNORMAL LOW (ref >39.00)
LDL CALC: 60 mg/dL (ref 0–99)
NonHDL: 84.38
TRIGLYCERIDES: 123 mg/dL (ref 0.0–149.0)
Total CHOL/HDL Ratio: 5
VLDL: 24.6 mg/dL (ref 0.0–40.0)

## 2014-12-10 LAB — BASIC METABOLIC PANEL
BUN: 9 mg/dL (ref 6–23)
CHLORIDE: 106 meq/L (ref 96–112)
CO2: 30 meq/L (ref 19–32)
CREATININE: 0.8 mg/dL (ref 0.40–1.20)
Calcium: 9.2 mg/dL (ref 8.4–10.5)
GFR: 76.97 mL/min (ref >60.00)
GLUCOSE: 98 mg/dL (ref 70–99)
POTASSIUM: 4.6 meq/L (ref 3.5–5.1)
Sodium: 140 mEq/L (ref 135–145)

## 2014-12-10 LAB — TSH: TSH: 3.03 u[IU]/mL (ref 0.35–4.50)

## 2014-12-10 MED ORDER — PRAVASTATIN SODIUM 40 MG PO TABS
40.0000 mg | ORAL_TABLET | Freq: Every day | ORAL | Status: DC
Start: 2014-12-10 — End: 2014-12-10

## 2014-12-10 MED ORDER — LEVOTHYROXINE SODIUM 125 MCG PO TABS: ORAL_TABLET | ORAL | Status: DC

## 2014-12-10 MED ORDER — PRAVASTATIN SODIUM 40 MG PO TABS: 40.0000 mg | ORAL_TABLET | Freq: Every day | ORAL | Status: DC

## 2014-12-10 MED ORDER — ESCITALOPRAM OXALATE 20 MG PO TABS: 20.0000 mg | ORAL_TABLET | Freq: Every day | ORAL | Status: DC

## 2015-01-18 ENCOUNTER — Ambulatory Visit: Payer: Self-pay | Admitting: Internal Medicine

## 2015-02-03 ENCOUNTER — Telehealth: Payer: Self-pay | Admitting: *Deleted

## 2015-02-03 NOTE — Telephone Encounter (Signed)
Pt had a fall and hurt her left breast, red and very tender, question if left breast should be examined. I called pt back and told her okay to schedule office visit with provider to exam, also to schedule annual visit. Transferred to front desk.

## 2015-02-04 ENCOUNTER — Ambulatory Visit (INDEPENDENT_AMBULATORY_CARE_PROVIDER_SITE_OTHER): Payer: BC Managed Care – PPO | Admitting: Gynecology

## 2015-02-04 ENCOUNTER — Encounter: Payer: Self-pay | Admitting: Gynecology

## 2015-02-04 VITALS — BP 134/80 | Ht 65.0 in | Wt 197.0 lb

## 2015-02-04 DIAGNOSIS — N952 Postmenopausal atrophic vaginitis: Secondary | ICD-10-CM

## 2015-02-04 DIAGNOSIS — Z01419 Encounter for gynecological examination (general) (routine) without abnormal findings: Secondary | ICD-10-CM

## 2015-02-04 DIAGNOSIS — N6489 Other specified disorders of breast: Secondary | ICD-10-CM | POA: Diagnosis not present

## 2015-02-04 DIAGNOSIS — M8580 Other specified disorders of bone density and structure, unspecified site: Secondary | ICD-10-CM

## 2015-02-04 NOTE — Progress Notes (Signed)
Holly Cochran Mar 07, 1950 [id number]        65 y.o.  G0P0  for annual exam.  Several issues noted below.  Past medical history,surgical history, problem list, medications, allergies, family history and social history were all reviewed and documented as reviewed in the EPIC chart.  ROS:  Performed with pertinent positives and negatives included in the history, assessment and plan.   Additional significant findings :  none   Exam: Caryn Bee assistant Filed Vitals:   02/04/15 0825  BP: 134/80  Height: 5\' 5"  (1.651 m)  Weight: 197 lb (89.359 kg)   General appearance:  Normal affect, orientation and appearance. Skin: Grossly normal HEENT: Without gross lesions.  No cervical or supraclavicular adenopathy. Thyroid normal.  Lungs:  Clear without wheezing, rales or rhonchi Cardiac: RR, without RMG Abdominal:  Soft, nontender, without masses, guarding, rebound, organomegaly or hernia Breasts:  Examined lying and sitting. Right without masses, retractions, discharge or axillary adenopathy.  Left with impressive ecchymosis involving the entire medial portion of the breast. 3-4 cm fluctuant nontender cystic feeling area 9 to 10:00 periphery consistent with hematoma. No discharge other masses or adenopathy. Left breast larger than right breast as always has been Pelvic:  Ext/BUS/vagina with atrophic changes  Cervix with atrophic changes  Uterus anteverted, normal size, shape and contour, midline and mobile nontender   Adnexa  Without masses or tenderness    Anus and perineum  Normal   Rectovaginal  Normal sphincter tone without palpated masses or tenderness.    Assessment/Plan:  65 y.o. G0P0 female for annual exam.   1. Postmenopausal/atrophic genital changes.  Does note some mild hot flushes and night sweats over the past several months. Recently had TSH checked 12/2014 which was normal. No other issues such as weight loss hair or skin changes. Recommended monitoring for now. If persists  then follow up for further evaluation.  Otherwise doing well with no vaginal bleeding or vaginal dryness. Continue to monitor and call if any bleeding or issues. 2. Left breast trauma.  Patient fell and hit her breast against a pole several days ago. She has impressive ecchymosis over the medial aspect of her left breast with a cystic feeling area at the 9 to 10:00 region consistent with a hematoma.  Is doing well though from a pain standpoint. Recommended heat and continued observation. Will allow for these changes to resolve. She is coming due for her mammogram in March and will go ahead and schedule a diagnostic mammography with ultrasound in the event that there is any persistence of the cystic area they'll be able to evaluated at that time. Do not feel evaluation now warranted given the history of recent trauma.  I discussed the plan with her and she knows that [facility] will call her to arrange the studies. 3. Pap smear/HPV negative 2015. No Pap smear done today.  No history of significant abnormal Pap smears previously. 4. Colonoscopy 2016. Repeat at their recommended interval. 5. Osteopenia. DEXA 12/2013 T score right femoral neck -1.8 FRAX 8.7%/0.9%. Patient will continue to follow up with [facility] in reference to her bone health. 6. Health maintenance. No routine lab work done as this is done at her primary physician's office.  Follow up for the breast studies in March otherwise annual exam in one year.   Anastasio Auerbach MD, 8:53 AM 02/04/2015

## 2015-02-04 NOTE — Patient Instructions (Signed)
[facility] should call you to arrange for the mammogram and ultrasound. Call us if you do not hear from them within the next 1-2 weeks.  Call if your breast starts to get worse and does not continue to improve.  You may obtain a copy of any labs that were done today by logging onto MyChart as outlined in the instructions provided with your AVS (after visit summary). The office will not call with normal lab results but certainly if there are any significant abnormalities then we will contact you.   Health Maintenance Adopting a healthy lifestyle and getting preventive care can go a long way to promote health and wellness. Talk with your health care provider about what schedule of regular examinations is right for you. This is a good chance for you to check in with your provider about disease prevention and staying healthy. In between checkups, there are plenty of things you can do on your own. Experts have done a lot of research about which lifestyle changes and preventive measures are most likely to keep you healthy. Ask your health care provider for more information. WEIGHT AND DIET  Eat a healthy diet  Be sure to include plenty of vegetables, fruits, low-fat dairy products, and lean protein.  Do not eat a lot of foods high in solid fats, added sugars, or salt.  Get regular exercise. This is one of the most important things you can do for your health.  Most adults should exercise for at least 150 minutes each week. The exercise should increase your heart rate and make you sweat (moderate-intensity exercise).  Most adults should also do strengthening exercises at least twice a week. This is in addition to the moderate-intensity exercise.  Maintain a healthy weight  Body mass index (BMI) is a measurement that can be used to identify possible weight problems. It estimates body fat based on height and weight. Your health care provider can help determine your BMI and help you achieve or maintain a  healthy weight.  For females 78 years of age and older:   A BMI below 18.5 is considered underweight.  A BMI of 18.5 to 24.9 is normal.  A BMI of 25 to 29.9 is considered overweight.  A BMI of 30 and above is considered obese.  Watch levels of cholesterol and blood lipids  You should start having your blood tested for lipids and cholesterol at 64 years of age, then have this test every 5 years.  You may need to have your cholesterol levels checked more often if:  Your lipid or cholesterol levels are high.  You are older than 64 years of age.  You are at high risk for heart disease.  CANCER SCREENING   Lung Cancer  Lung cancer screening is recommended for adults 30-73 years old who are at high risk for lung cancer because of a history of smoking.  A yearly low-dose CT scan of the lungs is recommended for people who:  Currently smoke.  Have quit within the past 15 years.  Have at least a 30-pack-year history of smoking. A pack year is smoking an average of one pack of cigarettes a day for 1 year.  Yearly screening should continue until it has been 15 years since you quit.  Yearly screening should stop if you develop a health problem that would prevent you from having lung cancer treatment.  Breast Cancer  Practice breast self-awareness. This means understanding how your breasts normally appear and feel.  It also means  doing regular breast self-exams. Let your health care provider know about any changes, no matter how small.  If you are in your 20s or 30s, you should have a clinical breast exam (CBE) by a health care provider every 1-3 years as part of a regular health exam.  If you are 30 or older, have a CBE every year. Also consider having a breast X-ray (mammogram) every year.  If you have a family history of breast cancer, talk to your health care provider about genetic screening.  If you are at high risk for breast cancer, talk to your health care provider  about having an MRI and a mammogram every year.  Breast cancer gene (BRCA) assessment is recommended for women who have family members with BRCA-related cancers. BRCA-related cancers include:  Breast.  Ovarian.  Tubal.  Peritoneal cancers.  Results of the assessment will determine the need for genetic counseling and BRCA1 and BRCA2 testing. Cervical Cancer Routine pelvic examinations to screen for cervical cancer are no longer recommended for nonpregnant women who are considered low risk for cancer of the pelvic organs (ovaries, uterus, and vagina) and who do not have symptoms. A pelvic examination may be necessary if you have symptoms including those associated with pelvic infections. Ask your health care provider if a screening pelvic exam is right for you.   The Pap test is the screening test for cervical cancer for women who are considered at risk.  If you had a hysterectomy for a problem that was not cancer or a condition that could lead to cancer, then you no longer need Pap tests.  If you are older than 65 years, and you have had normal Pap tests for the past 10 years, you no longer need to have Pap tests.  If you have had past treatment for cervical cancer or a condition that could lead to cancer, you need Pap tests and screening for cancer for at least 20 years after your treatment.  If you no longer get a Pap test, assess your risk factors if they change (such as having a new sexual partner). This can affect whether you should start being screened again.  Some women have medical problems that increase their chance of getting cervical cancer. If this is the case for you, your health care provider may recommend more frequent screening and Pap tests.  The human papillomavirus (HPV) test is another test that may be used for cervical cancer screening. The HPV test looks for the virus that can cause cell changes in the cervix. The cells collected during the Pap test can be tested for  HPV.  The HPV test can be used to screen women 65 years of age and older. Getting tested for HPV can extend the interval between normal Pap tests from three to five years.  An HPV test also should be used to screen women of any age who have unclear Pap test results.  After 64 years of age, women should have HPV testing as often as Pap tests.  Colorectal Cancer  This type of cancer can be detected and often prevented.  Routine colorectal cancer screening usually begins at 64 years of age and continues through 64 years of age.  Your health care provider may recommend screening at an earlier age if you have risk factors for colon cancer.  Your health care provider may also recommend using home test kits to check for hidden blood in the stool.  A small camera at the end of a  tube can be used to examine your colon directly (sigmoidoscopy or colonoscopy). This is done to check for the earliest forms of colorectal cancer.  Routine screening usually begins at age 53.  Direct examination of the colon should be repeated every 5-10 years through 64 years of age. However, you may need to be screened more often if early forms of precancerous polyps or small growths are found. Skin Cancer  Check your skin from head to toe regularly.  Tell your health care provider about any new moles or changes in moles, especially if there is a change in a mole's shape or color.  Also tell your health care provider if you have a mole that is larger than the size of a pencil eraser.  Always use sunscreen. Apply sunscreen liberally and repeatedly throughout the day.  Protect yourself by wearing long sleeves, pants, a wide-brimmed hat, and sunglasses whenever you are outside. HEART DISEASE, DIABETES, AND HIGH BLOOD PRESSURE   Have your blood pressure checked at least every 1-2 years. High blood pressure causes heart disease and increases the risk of stroke.  If you are between 29 years and 30 years old, ask  your health care provider if you should take aspirin to prevent strokes.  Have regular diabetes screenings. This involves taking a blood sample to check your fasting blood sugar level.  If you are at a normal weight and have a low risk for diabetes, have this test once every three years after 64 years of age.  If you are overweight and have a high risk for diabetes, consider being tested at a younger age or more often. PREVENTING INFECTION  Hepatitis B  If you have a higher risk for hepatitis B, you should be screened for this virus. You are considered at high risk for hepatitis B if:  You were born in a country where hepatitis B is common. Ask your health care provider which countries are considered high risk.  Your parents were born in a high-risk country, and you have not been immunized against hepatitis B (hepatitis B vaccine).  You have HIV or AIDS.  You use needles to inject street drugs.  You live with someone who has hepatitis B.  You have had sex with someone who has hepatitis B.  You get hemodialysis treatment.  You take certain medicines for conditions, including cancer, organ transplantation, and autoimmune conditions. Hepatitis C  Blood testing is recommended for:  Everyone born from [birth year] through 1964.  Anyone with known risk factors for hepatitis C. Sexually transmitted infections (STIs)  You should be screened for sexually transmitted infections (STIs) including gonorrhea and chlamydia if:  You are sexually active and are younger than 64 years of age.  You are older than 64 years of age and your health care provider tells you that you are at risk for this type of infection.  Your sexual activity has changed since you were last screened and you are at an increased risk for chlamydia or gonorrhea. Ask your health care provider if you are at risk.  If you do not have HIV, but are at risk, it may be recommended that you take a prescription medicine daily to  prevent HIV infection. This is called pre-exposure prophylaxis (PrEP). You are considered at risk if:  You are sexually active and do not regularly use condoms or know the HIV status of your partner(s).  You take drugs by injection.  You are sexually active with a partner who has HIV. Talk with  your health care provider about whether you are at high risk of being infected with HIV. If you choose to begin PrEP, you should first be tested for HIV. You should then be tested every 3 months for as long as you are taking PrEP.  PREGNANCY   If you are premenopausal and you may become pregnant, ask your health care provider about preconception counseling.  If you may become pregnant, take 400 to 800 micrograms (mcg) of folic acid every day.  If you want to prevent pregnancy, talk to your health care provider about birth control (contraception). OSTEOPOROSIS AND MENOPAUSE   Osteoporosis is a disease in which the bones lose minerals and strength with aging. This can result in serious bone fractures. Your risk for osteoporosis can be identified using a bone density scan.  If you are 37 years of age or older, or if you are at risk for osteoporosis and fractures, ask your health care provider if you should be screened.  Ask your health care provider whether you should take a calcium or vitamin D supplement to lower your risk for osteoporosis.  Menopause may have certain physical symptoms and risks.  Hormone replacement therapy may reduce some of these symptoms and risks. Talk to your health care provider about whether hormone replacement therapy is right for you.  HOME CARE INSTRUCTIONS   Schedule regular health, dental, and eye exams.  Stay current with your immunizations.   Do not use any tobacco products including cigarettes, chewing tobacco, or electronic cigarettes.  If you are pregnant, do not drink alcohol.  If you are breastfeeding, limit how much and how often you drink  alcohol.  Limit alcohol intake to no more than 1 drink per day for nonpregnant women. One drink equals 12 ounces of beer, 5 ounces of wine, or 1 ounces of hard liquor.  Do not use street drugs.  Do not share needles.  Ask your health care provider for help if you need support or information about quitting drugs.  Tell your health care provider if you often feel depressed.  Tell your health care provider if you have ever been abused or do not feel safe at home. Document Released: 07/11/2010 Document Revised: 05/12/2013 Document Reviewed: 11/27/2012 [facility] Patient Information 2015 [place], [place]. This information is not intended to replace advice given to you by your health care provider. Make sure you discuss any questions you have with your health care provider.

## 2015-02-18 ENCOUNTER — Telehealth: Payer: Self-pay

## 2015-02-18 NOTE — Telephone Encounter (Signed)
Patient informed that [facility] Malta will take care of placing orders for diagnostic mammo and u/s instead of her regular screening mammo. I told her I thought that [facility] will call  Her to schedule but if she has not heard something in a week to call back.

## 2015-02-18 NOTE — Telephone Encounter (Signed)
Patient said she was in a couple of weeks ago to see you and you told her you were "going to send some information over to [facility]".  She said she never heard from anyone.  Your note from that visit says:  "Left breast trauma. Patient fell and hit her breast against a pole several days ago. She has impressive ecchymosis over the medial aspect of her left breast with a cystic feeling area at the 9 to 10:00 region consistent with a hematoma. Is doing well though from a pain standpoint. Recommended heat and continued observation. Will allow for these changes to resolve. She is coming due for her mammogram in March and will go ahead and schedule a diagnostic mammography with ultrasound in the event that there is any persistence of the cystic area they'll be able to evaluated at that time. Do not feel evaluation now warranted given the history of recent trauma. I discussed the plan with her and she knows that [facility] Bradley will call her to arrange the studies."  I assume you would like [place] to order Diagnostic Mammo and u/s for her March appt instead of routine screening. Correct?

## 2015-02-18 NOTE — Telephone Encounter (Signed)
correct 

## 2015-02-22 NOTE — Telephone Encounter (Signed)
Appointment on 03/04/15 @ 2:00pm at [facility] pt aware. Order faxed.

## 2015-03-02 ENCOUNTER — Encounter: Payer: Self-pay | Admitting: Internal Medicine

## 2015-03-02 ENCOUNTER — Ambulatory Visit (INDEPENDENT_AMBULATORY_CARE_PROVIDER_SITE_OTHER): Payer: BC Managed Care – PPO | Admitting: Internal Medicine

## 2015-03-02 VITALS — BP 126/72 | HR 87 | Temp 98.5°F | Resp 16 | Wt 195.0 lb

## 2015-03-02 DIAGNOSIS — F419 Anxiety disorder, unspecified: Secondary | ICD-10-CM | POA: Insufficient documentation

## 2015-03-02 DIAGNOSIS — E785 Hyperlipidemia, unspecified: Secondary | ICD-10-CM | POA: Diagnosis not present

## 2015-03-02 DIAGNOSIS — F32A Depression, unspecified: Secondary | ICD-10-CM | POA: Insufficient documentation

## 2015-03-02 DIAGNOSIS — E890 Postprocedural hypothyroidism: Secondary | ICD-10-CM | POA: Diagnosis not present

## 2015-03-02 DIAGNOSIS — K219 Gastro-esophageal reflux disease without esophagitis: Secondary | ICD-10-CM | POA: Diagnosis not present

## 2015-03-02 MED ORDER — LEVOTHYROXINE SODIUM 125 MCG PO TABS: ORAL_TABLET | ORAL | Status: DC

## 2015-03-02 MED ORDER — ESCITALOPRAM OXALATE 20 MG PO TABS: 20.0000 mg | ORAL_TABLET | Freq: Every day | ORAL | Status: DC

## 2015-03-02 MED ORDER — PRAVASTATIN SODIUM 40 MG PO TABS: 40.0000 mg | ORAL_TABLET | Freq: Every day | ORAL | Status: DC

## 2015-03-02 MED ORDER — RANITIDINE HCL 150 MG PO TABS: 150.0000 mg | ORAL_TABLET | Freq: Two times a day (BID) | ORAL | Status: DC

## 2015-03-02 NOTE — Assessment & Plan Note (Signed)
Controlled  Continue daily zantac - she may try to stop or wean off medication

## 2015-03-02 NOTE — Assessment & Plan Note (Signed)
tsh checked recently - WNL Continue current dose

## 2015-03-02 NOTE — Patient Instructions (Signed)
  All other Health Maintenance issues reviewed.   All recommended immunizations and age-appropriate screenings are up-to-date.  No immunizations administered today.   Medications reviewed and updated.  No changes recommended at this time.  Your prescription(s) have been submitted to your pharmacy. Please take as directed and contact our office if you believe you are having problem(s) with the medication(s).  Please followup annually

## 2015-03-02 NOTE — Progress Notes (Signed)
Pre visit review using our clinic review tool, if applicable. No additional management support is needed unless otherwise documented below in the visit note. 

## 2015-03-02 NOTE — Progress Notes (Signed)
Subjective:    Patient ID: Holly Cochran, female    DOB: 06-27-1950, 65 y.o.   MRN: [id number]  HPI She is here to establish with a new pcp.   Hyperlipidemia: She is taking her medication daily. She is compliant with a low fat/cholesterol diet. She is exercising regularly. She denies myalgias.   Hypothyroidism:  She is taking her medication daily.  She denies any recent changes in energy or weight that are unexplained.   GERD:  She is taking her medication once daily as prescribed.  She denies any GERD symptoms and feels her GERD is well controlled.   Anxiety: She is taking her medication daily as prescribed. She denies any side effects from the medication. She feels her anxiety is well controlled and she is happy with her current dose of medication.    Medications and allergies reviewed with patient and updated if appropriate.  Patient Active Problem List   Diagnosis Date Noted  . Xerostomia 07/20/2014  . Chronically dry eyes 07/20/2014  . Common peroneal neuropathy of left lower extremity 05/20/2014  . GERD (gastroesophageal reflux disease) 02/08/2011  . RHINITIS 11/24/2008  . GANGLION OF TENDON SHEATH 11/10/2008  . [place], [place] 10/14/2007  . Hyperlipidemia 08/27/2007  . Osteopenia 08/27/2007  . Hypothyroidism, postradioiodine therapy 06/01/2006    Current Outpatient Prescriptions on File Prior to Visit  Medication Sig Dispense Refill  . aspirin 81 MG tablet Take 81 mg by mouth daily.      . cetirizine (ZYRTEC) 10 MG tablet Take 10 mg by mouth daily.    . Cholecalciferol (VITAMIN D) 1000 UNITS capsule Take 1,000 Units by mouth daily.      Marland Kitchen escitalopram (LEXAPRO) 20 MG tablet Take 1 tablet (20 mg total) by mouth daily. 90 tablet 1  . fish oil-omega-3 fatty acids 1000 MG capsule Take 2 g by mouth daily.      . fluticasone (FLONASE) 50 MCG/ACT nasal spray USE TWO SPRAY IN EACH NOSTRIL EVERY DAY 16 g 5  . levothyroxine (SYNTHROID, LEVOTHROID) 125 MCG tablet 1 qd  EXCEPT 1/2 on T, Th, & Sat 90 tablet 1  . pravastatin (PRAVACHOL) 40 MG tablet Take 1 tablet (40 mg total) by mouth at bedtime. 90 tablet 1  . ranitidine (ZANTAC) 150 MG tablet TAKE ONE TABLET BY MOUTH TWICE DAILY 180 tablet 1   No current facility-administered medications on file prior to visit.    Past Medical History  Diagnosis Date  . Thyroid disease     hyperthyroid-RA I  . Hyperlipidemia   . GERD (gastroesophageal reflux disease)   . Osteopenia 2015    T score -1.1  . Common peroneal neuropathy of left lower extremity 05/20/2014    Past Surgical History  Procedure Laterality Date  . Tonsillectomy    . Plantar fascia surgery  08/2010  . Colonoscopy  2005     Dr Sharlett Iles, negative    Social History   Social History  . Marital Status: Single    Spouse Name: N/A  . Number of Children: N/A  . Years of Education: N/A   Social History Main Topics  . Smoking status: Never Smoker   . Smokeless tobacco: Never Used  . Alcohol Use: 0.6 oz/week    1 Standard drinks or equivalent per week     Comment: occasionally  . Drug Use: No  . Sexual Activity: No     Comment: Virgin   Other Topics Concern  . None   Social History Narrative  Family History  Problem Relation Age of Onset  . Diabetes Mother   . Cancer Father     Pancreatic  . Breast cancer Paternal Aunt     Age 32's  . Cancer Maternal Uncle     melanoma  . Diabetes Maternal Uncle   . Colon cancer Neg Hx   . Esophageal cancer Neg Hx   . Stomach cancer Neg Hx   . Rectal cancer Neg Hx     Review of Systems  Constitutional: Negative for fever and chills.  Respiratory: Negative for choking, shortness of breath and wheezing.   Cardiovascular: Negative for chest pain, palpitations and leg swelling.  Gastrointestinal: Negative for nausea and abdominal pain.       No GERD - controlled  Neurological: Positive for headaches (sinus headahces). Negative for dizziness and light-headedness.       Objective:     Filed Vitals:   03/02/15 1535  BP: 126/72  Pulse: 87  Temp: 98.5 F (36.9 C)  Resp: 16   Filed Weights   03/02/15 1535  Weight: 195 lb (88.451 kg)   Body mass index is 32.45 kg/(m^2).   Physical Exam Constitutional: Appears well-developed and well-nourished. No distress.  Neck: Neck supple. No tracheal deviation present. No thyromegaly present.  No carotid bruit. No cervical adenopathy.   Cardiovascular: Normal rate, regular rhythm and normal heart sounds.   No murmur heard.  No edema Pulmonary/Chest: Effort normal and breath sounds normal. No respiratory distress. No wheezes.  Psych: normal mood and affect     Assessment & Plan:   See Problem List for Assessment and Plan of chronic medical problems.   Follow up annually, sooner if needed

## 2015-03-02 NOTE — Assessment & Plan Note (Signed)
HDL low Continue pravastatin at current dose Continue regular exercise and healthy diet

## 2015-03-02 NOTE — Assessment & Plan Note (Signed)
Controlled Continue current dose of lexapro

## 2015-03-04 LAB — HM MAMMOGRAPHY

## 2015-03-08 ENCOUNTER — Encounter: Payer: Self-pay | Admitting: Gynecology

## 2015-03-08 ENCOUNTER — Encounter: Payer: Self-pay | Admitting: Internal Medicine

## 2015-05-18 LAB — HM MAMMOGRAPHY

## 2015-05-20 ENCOUNTER — Encounter: Payer: Self-pay | Admitting: Internal Medicine

## 2015-05-28 ENCOUNTER — Encounter: Payer: Self-pay | Admitting: Gynecology

## 2015-09-21 ENCOUNTER — Encounter: Payer: Self-pay | Admitting: Gynecology

## 2015-09-23 ENCOUNTER — Encounter: Payer: Self-pay | Admitting: Internal Medicine

## 2015-11-19 ENCOUNTER — Ambulatory Visit (INDEPENDENT_AMBULATORY_CARE_PROVIDER_SITE_OTHER): Payer: BC Managed Care – PPO | Admitting: Emergency Medicine

## 2015-11-19 DIAGNOSIS — Z23 Encounter for immunization: Secondary | ICD-10-CM | POA: Diagnosis not present

## 2015-12-24 ENCOUNTER — Ambulatory Visit (INDEPENDENT_AMBULATORY_CARE_PROVIDER_SITE_OTHER): Payer: BC Managed Care – PPO | Admitting: Physician Assistant

## 2015-12-24 VITALS — BP 138/86 | HR 81 | Temp 98.3°F | Resp 17 | Ht 64.75 in | Wt 193.0 lb

## 2015-12-24 DIAGNOSIS — R0981 Nasal congestion: Secondary | ICD-10-CM

## 2015-12-24 DIAGNOSIS — J3489 Other specified disorders of nose and nasal sinuses: Secondary | ICD-10-CM | POA: Diagnosis not present

## 2015-12-24 DIAGNOSIS — B349 Viral infection, unspecified: Secondary | ICD-10-CM

## 2015-12-24 DIAGNOSIS — R059 Cough, unspecified: Secondary | ICD-10-CM

## 2015-12-24 DIAGNOSIS — R05 Cough: Secondary | ICD-10-CM | POA: Diagnosis not present

## 2015-12-24 MED ORDER — BENZONATATE 100 MG PO CAPS: 100.0000 mg | ORAL_CAPSULE | Freq: Three times a day (TID) | ORAL | 0 refills | Status: DC | PRN

## 2015-12-24 MED ORDER — MUCINEX DM MAXIMUM STRENGTH 60-1200 MG PO TB12: 1.0000 | ORAL_TABLET | Freq: Two times a day (BID) | ORAL | 1 refills | Status: DC

## 2015-12-24 NOTE — Patient Instructions (Addendum)
You have a viral illness, which is treated with supportive care. Please be sure to drink plenty of clear liquids. Warm tea with honey, lemon, slices of ginger and cloves will help your throat. Place a humidifier in your room which will also help.  Please take Mucinex as prescribed, flonase 2 puffs each nostril twice a day and tessalon pearls as prescribed.  Return in 5-7 days if you are still not better.   Thank you for coming in today. I hope you feel we met your needs.  Feel free to call [facility] if you have any questions or further requests.  Please consider signing up for MyChart if you do not already have it, as this is a great way to communicate with me.  Best,  Whitney McVey, PA-C    IF you received an x-ray today, you will receive an invoice from [facility]. Please contact [facility] at [phone] with questions or concerns regarding your invoice.   IF you received labwork today, you will receive an invoice from [facility]. Please contact [facility] at [phone] with questions or concerns regarding your invoice.   Our billing staff will not be able to assist you with questions regarding bills from these companies.  You will be contacted with the lab results as soon as they are available. The fastest way to get your results is to activate your My Chart account. Instructions are located on the last page of this paperwork. If you have not heard from us regarding the results in 2 weeks, please contact this office.     

## 2015-12-24 NOTE — Progress Notes (Signed)
Holly Cochran  MRN: [id number] DOB: [birth year]  PCP: Binnie Rail, MD  Subjective:  Pt is a 65 year old female PMH GERD, hypothyroidism, osteopenia, anxiety, HLD who presents to clinic for cough and congestion x two days.  Yesterday her throat and head felt "burning" pain. Today she endorses cough, congestion and sinus pressure. No difficulty swallowing. She is eating and drinking fine.  She has a history of seasonal allergies. Reports increased post nasal drip for the past week.  She is nervous because she is caring for a family member with stage four lung cancer at home and she does not want to get her sick.   Denies fever, chills, chest pain, palpitations, nausea, vomiting, headache, fatigue, drowsiness, sneezing, difficulty sleeping.  Review of Systems  Constitutional: Negative for chills, diaphoresis, fatigue and fever.  HENT: Positive for congestion, ear pain, postnasal drip, sinus pressure and sore throat. Negative for ear discharge, rhinorrhea and sneezing.   Respiratory: Positive for cough. Negative for chest tightness, shortness of breath and wheezing.   Cardiovascular: Negative for chest pain and palpitations.  Gastrointestinal: Negative for abdominal pain, diarrhea, nausea and vomiting.  Psychiatric/Behavioral: Negative for sleep disturbance.    Patient Active Problem List   Diagnosis Date Noted  . Anxiety 03/02/2015  . Xerostomia 07/20/2014  . Chronically dry eyes 07/20/2014  . Common peroneal neuropathy of left lower extremity 05/20/2014  . GERD (gastroesophageal reflux disease) 02/08/2011  . RHINITIS 11/24/2008  . GANGLION OF TENDON SHEATH 11/10/2008  . [place], [employer] 10/14/2007  . Hyperlipidemia 08/27/2007  . Osteopenia 08/27/2007  . Hypothyroidism, postradioiodine therapy 06/01/2006    Current Outpatient Prescriptions on File Prior to Visit  Medication Sig Dispense Refill  . aspirin 81 MG tablet Take 81 mg by mouth daily.      Marland Kitchen escitalopram  (LEXAPRO) 20 MG tablet Take 1 tablet (20 mg total) by mouth daily. 90 tablet 3  . fish oil-omega-3 fatty acids 1000 MG capsule Take 2 g by mouth daily.      . fluticasone (FLONASE) 50 MCG/ACT nasal spray USE TWO SPRAY IN EACH NOSTRIL EVERY DAY 16 g 5  . levothyroxine (SYNTHROID, LEVOTHROID) 125 MCG tablet 1 qd EXCEPT 1/2 on T, Th, & Sat 90 tablet 3  . pravastatin (PRAVACHOL) 40 MG tablet Take 1 tablet (40 mg total) by mouth at bedtime. 90 tablet 3  . ranitidine (ZANTAC) 150 MG tablet Take 1 tablet (150 mg total) by mouth 2 (two) times daily. 180 tablet 3   No current facility-administered medications on file prior to visit.     Allergies  Allergen Reactions  . Sulfa Antibiotics     Rash Because of a history of documented adverse serious drug reaction;Medi Alert bracelet  is recommended  . Prednisone     REACTION: FUNGAL INFECTION-MOUTH (?)     Objective:  BP 138/86 (BP Location: Right Arm, Patient Position: Sitting, Cuff Size: Large)   Pulse 81   Temp 98.3 F (36.8 C) (Oral)   Resp 17   Ht 5' 4.75" (1.645 m)   Wt 193 lb (87.5 kg)   LMP 01/09/2005   SpO2 97%   BMI 32.37 kg/m   Physical Exam  Constitutional: She is oriented to person, place, and time and well-developed, well-nourished, and in no distress. No distress.  HENT:  Right Ear: Tympanic membrane is bulging. Tympanic membrane is not injected.  Left Ear: Tympanic membrane is bulging. Tympanic membrane is not injected.  Nose: Mucosal edema present. No rhinorrhea. Right  sinus exhibits no maxillary sinus tenderness and no frontal sinus tenderness. Left sinus exhibits no maxillary sinus tenderness and no frontal sinus tenderness.  Mouth/Throat: Mucous membranes are normal. Posterior oropharyngeal erythema present. No oropharyngeal exudate or posterior oropharyngeal edema.  Cardiovascular: Normal rate, regular rhythm, S1 normal, S2 normal and normal heart sounds.   Pulmonary/Chest: Effort normal. She has no wheezes. She has no  rhonchi. She has no rales.  Neurological: She is alert and oriented to person, place, and time. GCS score is 15.  Skin: Skin is warm and dry.  Psychiatric: Mood, memory, affect and judgment normal.  Vitals reviewed.   Assessment and Plan :  1. Viral illness 2. Nasal congestion 3. Cough 4. Sinus pressure - Dextromethorphan-Guaifenesin (MUCINEX DM MAXIMUM STRENGTH) 60-1200 MG TB12; Take 1 tablet by mouth every 12 (twelve) hours.  Dispense: 14 each; Refill: 0 - benzonatate (TESSALON) 100 MG capsule; Take 1-2 capsules (100-200 mg total) by mouth 3 (three) times daily as needed for cough.  Dispense: 40 capsule; Refill: 0 - She has flonase at home, advised 2 puffs each nostril bid. Supportive care encouraged: Keep safe distance from friend with cancer, wear face mask, wash hands frequentl. Encouraged hot tea, humidifier,  drink plenty of fluids and rest. RTC in 5-7 days if no improvement. She understands and agrees.   Mercer Pod, PA-C  [facility] 12/24/2015 9:04 AM

## 2016-01-27 ENCOUNTER — Encounter: Payer: Self-pay | Admitting: Internal Medicine

## 2016-01-30 ENCOUNTER — Encounter: Payer: Self-pay | Admitting: Internal Medicine

## 2016-01-30 DIAGNOSIS — Z833 Family history of diabetes mellitus: Secondary | ICD-10-CM | POA: Insufficient documentation

## 2016-01-30 NOTE — Assessment & Plan Note (Signed)
Check tsh  Titrate med dose if needed  

## 2016-01-30 NOTE — Assessment & Plan Note (Signed)
Controlled, stable Continue current dose of medication - lexapro 20 mg daily

## 2016-01-30 NOTE — Assessment & Plan Note (Signed)
Check lipid panel  Continue daily statin Regular exercise and healthy diet encouraged  

## 2016-01-30 NOTE — Progress Notes (Signed)
Subjective:    Patient ID: Holly Cochran, female    DOB: [birth year], 66 y.o.   MRN: [id number]  HPI She is here for a physical exam.   Her partner of 30 years died last month.  Her mother is very sick and demanding.    She feels she is doing well overall but the past couple of years have been hard.  She feels the lexapro is helping and she is happy with her dose.    She has no concerns.   Medications and allergies reviewed with patient and updated if appropriate.  Patient Active Problem List   Diagnosis Date Noted  . Family history of diabetes mellitus in mother 01/30/2016  . Anxiety 03/02/2015  . Chronically dry eyes 07/20/2014  . Common peroneal neuropathy of left lower extremity 05/20/2014  . GERD (gastroesophageal reflux disease) 02/08/2011  . RHINITIS 11/24/2008  . GANGLION OF TENDON SHEATH 11/10/2008  . [place], [place] 10/14/2007  . Hyperlipidemia 08/27/2007  . Osteopenia 08/27/2007  . Hypothyroidism, postradioiodine therapy 06/01/2006    Current Outpatient Prescriptions on File Prior to Visit  Medication Sig Dispense Refill  . aspirin 81 MG tablet Take 81 mg by mouth daily.      Marland Kitchen escitalopram (LEXAPRO) 20 MG tablet Take 1 tablet (20 mg total) by mouth daily. 90 tablet 3  . fish oil-omega-3 fatty acids 1000 MG capsule Take 2 g by mouth daily.      . fluticasone (FLONASE) 50 MCG/ACT nasal spray USE TWO SPRAY IN EACH NOSTRIL EVERY DAY 16 g 5  . levothyroxine (SYNTHROID, LEVOTHROID) 125 MCG tablet 1 qd EXCEPT 1/2 on T, Th, & Sat 90 tablet 3  . pravastatin (PRAVACHOL) 40 MG tablet Take 1 tablet (40 mg total) by mouth at bedtime. 90 tablet 3  . ranitidine (ZANTAC) 150 MG tablet Take 1 tablet (150 mg total) by mouth 2 (two) times daily. 180 tablet 3   No current facility-administered medications on file prior to visit.     Past Medical History:  Diagnosis Date  . Common peroneal neuropathy of left lower extremity 05/20/2014  . GERD (gastroesophageal reflux  disease)   . Hyperlipidemia   . Osteopenia 2015   T score -1.1  . Thyroid disease    hyperthyroid-RA I    Past Surgical History:  Procedure Laterality Date  . COLONOSCOPY  2005    Dr Sharlett Iles, negative  . PLANTAR FASCIA SURGERY  08/2010  . TONSILLECTOMY      Social History   Social History  . Marital status: Single    Spouse name: N/A  . Number of children: N/A  . Years of education: N/A   Social History Main Topics  . Smoking status: Never Smoker  . Smokeless tobacco: Never Used  . Alcohol use 0.6 oz/week    1 Standard drinks or equivalent per week     Comment: occasionally  . Drug use: No  . Sexual activity: No     Comment: Virgin   Other Topics Concern  . None   Social History Narrative  . None    Family History  Problem Relation Age of Onset  . Diabetes Mother   . Cancer Father     Pancreatic  . Breast cancer Paternal Aunt     Age 15's  . Cancer Maternal Uncle     melanoma  . Diabetes Maternal Uncle   . Colon cancer Neg Hx   . Esophageal cancer Neg Hx   . Stomach cancer Neg  Hx   . Rectal cancer Neg Hx     Review of Systems  Constitutional: Positive for fatigue. Negative for appetite change, chills and fever.  HENT: Negative for hearing loss and tinnitus.   Eyes: Negative for visual disturbance.  Respiratory: Negative for cough, shortness of breath and wheezing.   Cardiovascular: Negative for chest pain, palpitations and leg swelling.  Gastrointestinal: Negative for abdominal pain, blood in stool, constipation, diarrhea and nausea.       No gerd  Genitourinary: Negative for dysuria and hematuria.  Musculoskeletal: Negative for arthralgias and back pain.  Skin: Negative for color change and rash.  Neurological: Negative for dizziness, light-headedness and headaches.  Psychiatric/Behavioral: Negative for sleep disturbance.       Objective:   Vitals:   01/31/16 0756  BP: 118/80  Pulse: 80  Resp: 16  Temp: 98.2 F (36.8 C)   Filed  Weights   01/31/16 0756  Weight: 191 lb (86.6 kg)   Body mass index is 31.78 kg/m.  Wt Readings from Last 3 Encounters:  01/31/16 191 lb (86.6 kg)  12/24/15 193 lb (87.5 kg)  03/02/15 195 lb (88.5 kg)     Physical Exam Constitutional: She appears well-developed and well-nourished. No distress.  HENT:  Head: Normocephalic and atraumatic.  Right Ear: External ear normal. Normal ear canal and TM Left Ear: External ear normal.  Normal ear canal and TM Mouth/Throat: Oropharynx is clear and moist.  Eyes: Conjunctivae and EOM are normal.  Neck: Neck supple. No tracheal deviation present. No thyromegaly present.  No carotid bruit  Cardiovascular: Normal rate, regular rhythm and normal heart sounds.   No murmur heard.  No edema. Pulmonary/Chest: Effort normal and breath sounds normal. No respiratory distress. She has no wheezes. She has no rales.  Breast: deferred to Gyn Abdominal: Soft. She exhibits no distension. There is no tenderness.  Lymphadenopathy: She has no cervical adenopathy.  Skin: Skin is warm and dry. She is not diaphoretic.  Psychiatric: She has a normal mood and affect. Her behavior is normal.       Assessment & Plan:   Physical exam: Screening blood work ordered Immunizations  Tetanus due  Colonoscopy  Up to date  Mammogram   Up to date  Gyn   Up to date  Dexa - last done 2015 Eye exams  Up to date  EKG - last done 2013, will get one today: NSR, RSR which is new compared to 02/08/11 but non diagnostic, normal EKG Exercise - regular - walks Weight - has lost some weight - work on additional weight loss Skin  - no concerns Substance abuse   none  See Problem List for Assessment and Plan of chronic medical problems.   FU annually, sooner if needed

## 2016-01-30 NOTE — Assessment & Plan Note (Signed)
Will check a1c

## 2016-01-30 NOTE — Patient Instructions (Addendum)
Test(s) ordered today. Your results will be released to [facility] (or called to you) after review, usually within 72hours after test completion. If any changes need to be made, you will be notified at that same time.  All other Health Maintenance issues reviewed.   All recommended immunizations and age-appropriate screenings are up-to-date or discussed.  tetanus immunization administered today.   Medications reviewed and updated.  No changes recommended at this time.   An EKG was done today  Please followup in one year for a physical, sooner if needed    Health Maintenance, Female Introduction Adopting a healthy lifestyle and getting preventive care can go a long way to promote health and wellness. Talk with your health care provider about what schedule of regular examinations is right for you. This is a good chance for you to check in with your provider about disease prevention and staying healthy. In between checkups, there are plenty of things you can do on your own. Experts have done a lot of research about which lifestyle changes and preventive measures are most likely to keep you healthy. Ask your health care provider for more information. Weight and diet Eat a healthy diet  Be sure to include plenty of vegetables, fruits, low-fat dairy products, and lean protein.  Do not eat a lot of foods high in solid fats, added sugars, or salt.  Get regular exercise. This is one of the most important things you can do for your health.  Most adults should exercise for at least 150 minutes each week. The exercise should increase your heart rate and make you sweat (moderate-intensity exercise).  Most adults should also do strengthening exercises at least twice a week. This is in addition to the moderate-intensity exercise. Maintain a healthy weight  Body mass index (BMI) is a measurement that can be used to identify possible weight problems. It estimates body fat based on height and weight.  Your health care provider can help determine your BMI and help you achieve or maintain a healthy weight.  For females 82 years of age and older:  A BMI below 18.5 is considered underweight.  A BMI of 18.5 to 24.9 is normal.  A BMI of 25 to 29.9 is considered overweight.  A BMI of 30 and above is considered obese. Watch levels of cholesterol and blood lipids  You should start having your blood tested for lipids and cholesterol at 65 years of age, then have this test every 5 years.  You may need to have your cholesterol levels checked more often if:  Your lipid or cholesterol levels are high.  You are older than 65 years of age.  You are at high risk for heart disease. Cancer screening Lung Cancer  Lung cancer screening is recommended for adults 61-50 years old who are at high risk for lung cancer because of a history of smoking.  A yearly low-dose CT scan of the lungs is recommended for people who:  Currently smoke.  Have quit within the past 15 years.  Have at least a 30-pack-year history of smoking. A pack year is smoking an average of one pack of cigarettes a day for 1 year.  Yearly screening should continue until it has been 15 years since you quit.  Yearly screening should stop if you develop a health problem that would prevent you from having lung cancer treatment. Breast Cancer  Practice breast self-awareness. This means understanding how your breasts normally appear and feel.  It also means doing regular breast  self-exams. Let your health care provider know about any changes, no matter how small.  If you are in your 20s or 30s, you should have a clinical breast exam (CBE) by a health care provider every 1-3 years as part of a regular health exam.  If you are 1 or older, have a CBE every year. Also consider having a breast X-ray (mammogram) every year.  If you have a family history of breast cancer, talk to your health care provider about genetic  screening.  If you are at high risk for breast cancer, talk to your health care provider about having an MRI and a mammogram every year.  Breast cancer gene (BRCA) assessment is recommended for women who have family members with BRCA-related cancers. BRCA-related cancers include:  Breast.  Ovarian.  Tubal.  Peritoneal cancers.  Results of the assessment will determine the need for genetic counseling and BRCA1 and BRCA2 testing. Cervical Cancer  Your health care provider may recommend that you be screened regularly for cancer of the pelvic organs (ovaries, uterus, and vagina). This screening involves a pelvic examination, including checking for microscopic changes to the surface of your cervix (Pap test). You may be encouraged to have this screening done every 3 years, beginning at age 59.  For women ages 65-65, health care providers may recommend pelvic exams and Pap testing every 3 years, or they may recommend the Pap and pelvic exam, combined with testing for human papilloma virus (HPV), every 5 years. Some types of HPV increase your risk of cervical cancer. Testing for HPV may also be done on women of any age with unclear Pap test results.  Other health care providers may not recommend any screening for nonpregnant women who are considered low risk for pelvic cancer and who do not have symptoms. Ask your health care provider if a screening pelvic exam is right for you.  If you have had past treatment for cervical cancer or a condition that could lead to cancer, you need Pap tests and screening for cancer for at least 20 years after your treatment. If Pap tests have been discontinued, your risk factors (such as having a new sexual partner) need to be reassessed to determine if screening should resume. Some women have medical problems that increase the chance of getting cervical cancer. In these cases, your health care provider may recommend more frequent screening and Pap tests. Colorectal  Cancer  This type of cancer can be detected and often prevented.  Routine colorectal cancer screening usually begins at 65 years of age and continues through 65 years of age.  Your health care provider may recommend screening at an earlier age if you have risk factors for colon cancer.  Your health care provider may also recommend using home test kits to check for hidden blood in the stool.  A small camera at the end of a tube can be used to examine your colon directly (sigmoidoscopy or colonoscopy). This is done to check for the earliest forms of colorectal cancer.  Routine screening usually begins at age 36.  Direct examination of the colon should be repeated every 5-10 years through 65 years of age. However, you may need to be screened more often if early forms of precancerous polyps or small growths are found. Skin Cancer  Check your skin from head to toe regularly.  Tell your health care provider about any new moles or changes in moles, especially if there is a change in a mole's shape or color.  Also tell your health care provider if you have a mole that is larger than the size of a pencil eraser.  Always use sunscreen. Apply sunscreen liberally and repeatedly throughout the day.  Protect yourself by wearing long sleeves, pants, a wide-brimmed hat, and sunglasses whenever you are outside. Heart disease, diabetes, and high blood pressure  High blood pressure causes heart disease and increases the risk of stroke. High blood pressure is more likely to develop in:  People who have blood pressure in the high end of the normal range (130-139/85-89 mm Hg).  People who are overweight or obese.  People who are African American.  If you are 50-29 years of age, have your blood pressure checked every 3-5 years. If you are 78 years of age or older, have your blood pressure checked every year. You should have your blood pressure measured twice-once when you are at a hospital or clinic,  and once when you are not at a hospital or clinic. Record the average of the two measurements. To check your blood pressure when you are not at a hospital or clinic, you can use:  An automated blood pressure machine at a pharmacy.  A home blood pressure monitor.  If you are between 79 years and 79 years old, ask your health care provider if you should take aspirin to prevent strokes.  Have regular diabetes screenings. This involves taking a blood sample to check your fasting blood sugar level.  If you are at a normal weight and have a low risk for diabetes, have this test once every three years after 65 years of age.  If you are overweight and have a high risk for diabetes, consider being tested at a younger age or more often. Preventing infection Hepatitis B  If you have a higher risk for hepatitis B, you should be screened for this virus. You are considered at high risk for hepatitis B if:  You were born in a country where hepatitis B is common. Ask your health care provider which countries are considered high risk.  Your parents were born in a high-risk country, and you have not been immunized against hepatitis B (hepatitis B vaccine).  You have HIV or AIDS.  You use needles to inject street drugs.  You live with someone who has hepatitis B.  You have had sex with someone who has hepatitis B.  You get hemodialysis treatment.  You take certain medicines for conditions, including cancer, organ transplantation, and autoimmune conditions. Hepatitis C  Blood testing is recommended for:  Everyone born from [birth year] through 1964.  Anyone with known risk factors for hepatitis C. Sexually transmitted infections (STIs)  You should be screened for sexually transmitted infections (STIs) including gonorrhea and chlamydia if:  You are sexually active and are younger than 65 years of age.  You are older than 65 years of age and your health care provider tells you that you are at risk  for this type of infection.  Your sexual activity has changed since you were last screened and you are at an increased risk for chlamydia or gonorrhea. Ask your health care provider if you are at risk.  If you do not have HIV, but are at risk, it may be recommended that you take a prescription medicine daily to prevent HIV infection. This is called pre-exposure prophylaxis (PrEP). You are considered at risk if:  You are sexually active and do not regularly use condoms or know the HIV status of your partner(s).  You take drugs by injection.  You are sexually active with a partner who has HIV. Talk with your health care provider about whether you are at high risk of being infected with HIV. If you choose to begin PrEP, you should first be tested for HIV. You should then be tested every 3 months for as long as you are taking PrEP. Pregnancy  If you are premenopausal and you may become pregnant, ask your health care provider about preconception counseling.  If you may become pregnant, take 400 to 800 micrograms (mcg) of folic acid every day.  If you want to prevent pregnancy, talk to your health care provider about birth control (contraception). Osteoporosis and menopause  Osteoporosis is a disease in which the bones lose minerals and strength with aging. This can result in serious bone fractures. Your risk for osteoporosis can be identified using a bone density scan.  If you are 72 years of age or older, or if you are at risk for osteoporosis and fractures, ask your health care provider if you should be screened.  Ask your health care provider whether you should take a calcium or vitamin D supplement to lower your risk for osteoporosis.  Menopause may have certain physical symptoms and risks.  Hormone replacement therapy may reduce some of these symptoms and risks. Talk to your health care provider about whether hormone replacement therapy is right for you. Follow these instructions at  home:  Schedule regular health, dental, and eye exams.  Stay current with your immunizations.  Do not use any tobacco products including cigarettes, chewing tobacco, or electronic cigarettes.  If you are pregnant, do not drink alcohol.  If you are breastfeeding, limit how much and how often you drink alcohol.  Limit alcohol intake to no more than 1 drink per day for nonpregnant women. One drink equals 12 ounces of beer, 5 ounces of wine, or 1 ounces of hard liquor.  Do not use street drugs.  Do not share needles.  Ask your health care provider for help if you need support or information about quitting drugs.  Tell your health care provider if you often feel depressed.  Tell your health care provider if you have ever been abused or do not feel safe at home. This information is not intended to replace advice given to you by your health care provider. Make sure you discuss any questions you have with your health care provider. Document Released: 07/11/2010 Document Revised: 06/03/2015 Document Reviewed: 09/29/2014  2017 Elsevier

## 2016-01-31 ENCOUNTER — Other Ambulatory Visit (INDEPENDENT_AMBULATORY_CARE_PROVIDER_SITE_OTHER): Payer: BC Managed Care – PPO

## 2016-01-31 ENCOUNTER — Encounter: Payer: Self-pay | Admitting: Internal Medicine

## 2016-01-31 ENCOUNTER — Ambulatory Visit (INDEPENDENT_AMBULATORY_CARE_PROVIDER_SITE_OTHER): Payer: BC Managed Care – PPO | Admitting: Internal Medicine

## 2016-01-31 VITALS — BP 118/80 | HR 80 | Temp 98.2°F | Resp 16 | Ht 65.0 in | Wt 191.0 lb

## 2016-01-31 DIAGNOSIS — Z0000 Encounter for general adult medical examination without abnormal findings: Secondary | ICD-10-CM | POA: Diagnosis not present

## 2016-01-31 DIAGNOSIS — M85869 Other specified disorders of bone density and structure, unspecified lower leg: Secondary | ICD-10-CM

## 2016-01-31 DIAGNOSIS — K219 Gastro-esophageal reflux disease without esophagitis: Secondary | ICD-10-CM | POA: Diagnosis not present

## 2016-01-31 DIAGNOSIS — E7800 Pure hypercholesterolemia, unspecified: Secondary | ICD-10-CM | POA: Diagnosis not present

## 2016-01-31 DIAGNOSIS — Z23 Encounter for immunization: Secondary | ICD-10-CM

## 2016-01-31 DIAGNOSIS — Z13820 Encounter for screening for osteoporosis: Secondary | ICD-10-CM

## 2016-01-31 DIAGNOSIS — Z833 Family history of diabetes mellitus: Secondary | ICD-10-CM

## 2016-01-31 DIAGNOSIS — E890 Postprocedural hypothyroidism: Secondary | ICD-10-CM

## 2016-01-31 DIAGNOSIS — F419 Anxiety disorder, unspecified: Secondary | ICD-10-CM

## 2016-01-31 DIAGNOSIS — Z1159 Encounter for screening for other viral diseases: Secondary | ICD-10-CM

## 2016-01-31 LAB — CBC WITH DIFFERENTIAL/PLATELET
BASOS ABS: 0 10*3/uL (ref 0.0–0.1)
Basophils Relative: 0.3 % (ref 0.0–3.0)
EOS PCT: 2.1 % (ref 0.0–5.0)
Eosinophils Absolute: 0.1 10*3/uL (ref 0.0–0.7)
HCT: 41.8 % (ref 36.0–46.0)
Hemoglobin: 14.4 g/dL (ref 12.0–15.0)
LYMPHS ABS: 1.9 10*3/uL (ref 0.7–4.0)
Lymphocytes Relative: 30.6 % (ref 12.0–46.0)
MCHC: 34.3 g/dL (ref 30.0–36.0)
MCV: 90.8 fl (ref 78.0–100.0)
MONOS PCT: 10.5 % (ref 3.0–12.0)
Monocytes Absolute: 0.7 10*3/uL (ref 0.1–1.0)
NEUTROS ABS: 3.5 10*3/uL (ref 1.4–7.7)
NEUTROS PCT: 56.5 % (ref 43.0–77.0)
PLATELETS: 311 10*3/uL (ref 150.0–400.0)
RBC: 4.61 Mil/uL (ref 3.87–5.11)
RDW: 13 % (ref 11.5–15.5)
WBC: 6.2 10*3/uL (ref 4.0–10.5)

## 2016-01-31 LAB — COMPREHENSIVE METABOLIC PANEL
ALT: 21 U/L (ref 0–35)
AST: 21 U/L (ref 0–37)
Albumin: 4.2 g/dL (ref 3.5–5.2)
Alkaline Phosphatase: 84 U/L (ref 39–117)
BUN: 16 mg/dL (ref 6–23)
CHLORIDE: 107 meq/L (ref 96–112)
CO2: 28 meq/L (ref 19–32)
Calcium: 8.9 mg/dL (ref 8.4–10.5)
Creatinine, Ser: 0.83 mg/dL (ref 0.40–1.20)
GFR: 73.5 mL/min (ref >60.00)
GLUCOSE: 89 mg/dL (ref 70–99)
POTASSIUM: 4.4 meq/L (ref 3.5–5.1)
Sodium: 139 mEq/L (ref 135–145)
Total Bilirubin: 0.4 mg/dL (ref 0.2–1.2)
Total Protein: 7.1 g/dL (ref 6.0–8.3)

## 2016-01-31 LAB — VITAMIN D 25 HYDROXY (VIT D DEFICIENCY, FRACTURES): VITD: 18.58 ng/mL — ABNORMAL LOW (ref 30.00–100.00)

## 2016-01-31 LAB — LIPID PANEL
CHOL/HDL RATIO: 4
Cholesterol: 111 mg/dL (ref 0–200)
HDL: 26.5 mg/dL — AB (ref >39.00)
LDL Cholesterol: 51 mg/dL (ref 0–99)
NONHDL: 84.57
Triglycerides: 169 mg/dL — ABNORMAL HIGH (ref 0.0–149.0)
VLDL: 33.8 mg/dL (ref 0.0–40.0)

## 2016-01-31 LAB — TSH: TSH: 0.17 u[IU]/mL — AB (ref 0.35–4.50)

## 2016-01-31 LAB — HEPATITIS C ANTIBODY: HCV Ab: NEGATIVE

## 2016-01-31 LAB — HEMOGLOBIN A1C: HEMOGLOBIN A1C: 5.5 % (ref 4.6–6.5)

## 2016-01-31 NOTE — Assessment & Plan Note (Signed)
GERD controlled Continue daily medication - zantac 2/day

## 2016-01-31 NOTE — Progress Notes (Signed)
Pre visit review using our clinic review tool, if applicable. No additional management support is needed unless otherwise documented below in the visit note. 

## 2016-01-31 NOTE — Assessment & Plan Note (Signed)
dexa due - will order Not taking vitamin d - will check level Exercising - walking

## 2016-02-01 ENCOUNTER — Encounter: Payer: Self-pay | Admitting: Internal Medicine

## 2016-02-02 ENCOUNTER — Encounter: Payer: Self-pay | Admitting: Internal Medicine

## 2016-02-02 DIAGNOSIS — E890 Postprocedural hypothyroidism: Secondary | ICD-10-CM

## 2016-02-04 MED ORDER — LEVOTHYROXINE SODIUM 125 MCG PO TABS: ORAL_TABLET | ORAL | 3 refills | Status: DC

## 2016-02-16 ENCOUNTER — Other Ambulatory Visit: Payer: Self-pay | Admitting: Internal Medicine

## 2016-02-16 DIAGNOSIS — E7800 Pure hypercholesterolemia, unspecified: Secondary | ICD-10-CM

## 2016-02-16 DIAGNOSIS — E890 Postprocedural hypothyroidism: Secondary | ICD-10-CM

## 2016-02-16 MED ORDER — ESCITALOPRAM OXALATE 20 MG PO TABS: 20.0000 mg | ORAL_TABLET | Freq: Every day | ORAL | 3 refills | Status: DC

## 2016-02-16 MED ORDER — PRAVASTATIN SODIUM 40 MG PO TABS: 40.0000 mg | ORAL_TABLET | Freq: Every day | ORAL | 3 refills | Status: DC

## 2016-02-16 MED ORDER — LEVOTHYROXINE SODIUM 125 MCG PO TABS
ORAL_TABLET | ORAL | 3 refills | Status: DC
Start: 2016-02-16 — End: 2017-02-05

## 2016-02-16 MED ORDER — RANITIDINE HCL 150 MG PO TABS: 150.0000 mg | ORAL_TABLET | Freq: Two times a day (BID) | ORAL | 3 refills | Status: DC

## 2016-03-02 ENCOUNTER — Inpatient Hospital Stay: Admission: RE | Admit: 2016-03-02 | Payer: Self-pay | Source: Ambulatory Visit

## 2016-03-03 ENCOUNTER — Ambulatory Visit (INDEPENDENT_AMBULATORY_CARE_PROVIDER_SITE_OTHER): Payer: BC Managed Care – PPO | Admitting: Physician Assistant

## 2016-03-03 VITALS — BP 126/74 | HR 89 | Temp 98.2°F | Resp 16 | Ht 64.75 in | Wt 191.0 lb

## 2016-03-03 DIAGNOSIS — R059 Cough, unspecified: Secondary | ICD-10-CM

## 2016-03-03 DIAGNOSIS — M26609 Unspecified temporomandibular joint disorder, unspecified side: Secondary | ICD-10-CM

## 2016-03-03 DIAGNOSIS — R0981 Nasal congestion: Secondary | ICD-10-CM

## 2016-03-03 DIAGNOSIS — R05 Cough: Secondary | ICD-10-CM

## 2016-03-03 MED ORDER — PSEUDOEPHEDRINE HCL ER 120 MG PO TB12: 120.0000 mg | ORAL_TABLET | Freq: Two times a day (BID) | ORAL | 0 refills | Status: DC

## 2016-03-03 MED ORDER — NAPROXEN 500 MG PO TABS: 500.0000 mg | ORAL_TABLET | Freq: Two times a day (BID) | ORAL | 0 refills | Status: DC

## 2016-03-03 MED ORDER — CYCLOBENZAPRINE HCL 5 MG PO TABS: 5.0000 mg | ORAL_TABLET | Freq: Three times a day (TID) | ORAL | 0 refills | Status: DC | PRN

## 2016-03-03 MED ORDER — HYDROCOD POLST-CPM POLST ER 10-8 MG/5ML PO SUER: 5.0000 mL | Freq: Two times a day (BID) | ORAL | 0 refills | Status: DC | PRN

## 2016-03-03 NOTE — Progress Notes (Signed)
MRN: [id number] DOB: 10-06-1950  Subjective:   Holly Cochran is a 66 y.o. female presenting for chief complaint of Nasal Congestion (X 2 DAYS) and Jaw Pain (X 1 DAY) .  Reports 2 day history of sinus congestion, ear fullness, sore throat and dry cough (no hemoptysis). Also having a flare up of her TMJ on the left side. She has been gritting her teeth more over the past week and noticed it was made worse when she got the head cold.   Has tried vitamin c and mucinex with moderate relief. Denies fever, wheezing, shortness of breath and myalgia, night sweats, chills, nausea, vomiting, abdominal pain and diarrhea. Has had sick contact with family members. Has history of seasonal allergies, no history of asthma. Patient has had flu shot this season. Denies smoking, has occasional alcohol use. Denies any other aggravating or relieving factors, no other questions or concerns.  [name] has a current medication list which includes the following prescription(s): aspirin, escitalopram, fish oil-omega-3 fatty acids, fluticasone, levothyroxine, pravastatin, and ranitidine. Also is allergic to sulfa antibiotics and prednisone.  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 Also  has a past surgical history that includes Tonsillectomy; Plantar fascia surgery (08/2010); and Colonoscopy (2005).   Objective:   Vitals: BP 126/74 (BP Location: Right Arm, Patient Position: Sitting, Cuff Size: Large)   Pulse 89   Temp 98.2 F (36.8 C) (Oral)   Resp 16   Ht 5' 4.75" (1.645 m)   Wt 191 lb (86.6 kg)   LMP 01/09/2005   SpO2 94%   BMI 32.03 kg/m   Physical Exam  Constitutional: She is oriented to person, place, and time. She appears well-developed and well-nourished. No distress.  HENT:  Head: Normocephalic and atraumatic.     Right Ear: Tympanic membrane, external ear and ear canal normal.  Left Ear: Tympanic membrane, external ear and ear canal normal.  Nose: Mucosal edema (moderate) and rhinorrhea present. Right sinus exhibits no maxillary sinus tenderness and no frontal sinus tenderness. Left sinus exhibits no maxillary sinus tenderness and no frontal sinus tenderness.  Mouth/Throat: Uvula is midline. Posterior oropharyngeal erythema present. Tonsils are 0 on the right. Tonsils are 0 on the left. No tonsillar exudate.  Eyes: Conjunctivae are normal.  Neck: Normal range of motion.  Cardiovascular: Normal rate, regular rhythm and normal heart sounds.   Pulmonary/Chest: Effort normal and breath sounds normal.  Neurological: She is alert and oriented to person, place, and time.  Skin: Skin is warm and dry.  Psychiatric: She has a normal mood and affect.  Vitals reviewed.  No results found for this or any previous visit (from the past 24 hour(s)).  Assessment and Plan :  1. Sinus congestion History and PE findings suggestive of viral etiology, will treat symptoms at this time. Pt instructed to RTC if no improvement in 5-7 days.  - pseudoephedrine (SUDAFED 12 HOUR) 120 MG 12 hr tablet; Take 1 tablet (120 mg total) by mouth 2 (two) times daily.  Dispense: 20 tablet; Refill: 0 2. Nasal congestion 3. Cough - chlorpheniramine-HYDROcodone (TUSSIONEX PENNKINETIC ER) 10-8 MG/5ML SUER; Take 5 mLs by mouth every 12 (twelve) hours as needed for cough.  Dispense: 100 mL; Refill: 0  4. TMJ (temporomandibular joint disorder) -Given educational material for TMJ exercises. Instructed to use NSAIDs and muscle relaxers for the next 1-2 weeks, if no  improvement in 2 weeks return, may benefit from ENT referral at this time.  - naproxen (NAPROSYN) 500 MG tablet; Take 1 tablet (500 mg total) by mouth 2 (two) times daily with a meal.  Dispense: 30 tablet; Refill: 0 - cyclobenzaprine (FLEXERIL) 5 MG tablet; Take 1 tablet (5 mg  total) by mouth 3 (three) times daily as needed for muscle spasms.  Dispense: 60 tablet; Refill: 0  Holly Delaine, PA-C  [facility] 03/03/2016 8:51 AM

## 2016-03-03 NOTE — Patient Instructions (Addendum)
For sinus issues, use sudafed twice daily along with flonase daily and cough syrup as needed.  Be aware that cough syrup can definitely make you drowsy and sleepy so do not drive or operate any heavy machinery if it is affecting you during the day. Use your zyrtec as well. - You may also use Tylenol or ibuprofen over-the-counter for your sore throat.  - Please let me know if you are not seeing any improvement or get worse in 5-7 days.   For TMJ flare, take naproxen twice daily and use flexeril at night consistent for the next week. If this helps you can continue for an additional week. At two weeks, if pain is still present, return to clinic. Below are some exercises you can do for TMJ once the pain is resolving.   Thank you for letting me participate in your health and well being.    Jaw Range of Motion Exercises Introduction Jaw range of motion exercises are exercises that help your jaw to move better. These exercises can help to prevent:  Difficulty opening your mouth.  Pain in your jaw while it is both open and closed. What should I be careful of when doing jaw exercises? Make sure that you only do jaw exercises as directed by your health care provider. You should only move your jaw as far as it can go in each direction, if told to do so by your health care provider. Do not move your jaw into positions that cause you any pain. What exercises should I do?  Stick your jaw forward. Hold this position for 1-2 seconds. Allow your jaw to return to its normal position and rest it there for 1-2 seconds. Do this exercise 8 times.  Stand or sit in front of a mirror. Place your tongue on the roof of your mouth, just behind your top teeth. Slowly open and close your jaw, keeping your tongue on the roof of your mouth. While you open and close your mouth, try to keep your jaw from moving toward one side or the other. Repeat this 8 times.  Move your jaw right. Hold this position for 1-2 seconds. Allow  your jaw to return to its normal position, and rest it there for 1-2 seconds. Do this exercise 8 times.  Move your jaw left. Hold this position for 1-2 seconds. Allow your jaw to return to its normal position, and rest it there for 1-2 seconds. Do this exercise 8 times.  Open your mouth as far as it is can comfortably go. Hold this position for 1-2 seconds. Then close your mouth and rest for 1-2 seconds. Do this exercise 8 times.  Move your jaw in a circular motion, starting toward the right (clockwise). Repeat this 8 times.  Move your jaw in a circular motion, starting toward the left (counterclockwise). Repeat this 8 times. Apply moist heat packs or ice packs to your jaw before or after performing your exercises as directed by your health care provider. What else can I do? Avoid the following, if they cause jaw pain or they increase your jaw pain:  Chewing gum.  Clenching your jaw or teeth or keeping tension in your jaw muscles.  Leaning on your jaw, such as resting your jaw in your hand while leaning on a desk. This information is not intended to replace advice given to you by your health care provider. Make sure you discuss any questions you have with your health care provider. Document Released: 12/09/2007 Document Revised: 06/03/2015  Document Reviewed: 11/26/2013  2017 Elsevier     IF you received an x-ray today, you will receive an invoice from [facility]. Please contact [facility] at [phone] with questions or concerns regarding your invoice.   IF you received labwork today, you will receive an invoice from [facility]. Please contact [facility] at [phone] with questions or concerns regarding your invoice.   Our billing staff will not be able to assist you with questions regarding bills from these companies.  You will be contacted with the lab results as soon as they are available. The fastest way to get your results is to activate your My Chart  account. Instructions are located on the last page of this paperwork. If you have not heard from [place] regarding the results in 2 weeks, please contact this office.

## 2016-03-09 ENCOUNTER — Encounter: Payer: Self-pay | Admitting: Gynecology

## 2016-03-09 ENCOUNTER — Other Ambulatory Visit: Payer: Self-pay

## 2016-03-12 ENCOUNTER — Other Ambulatory Visit: Payer: Self-pay | Admitting: Internal Medicine

## 2016-03-12 DIAGNOSIS — E890 Postprocedural hypothyroidism: Secondary | ICD-10-CM

## 2016-03-14 ENCOUNTER — Encounter: Payer: Self-pay | Admitting: Gynecology

## 2016-08-31 ENCOUNTER — Other Ambulatory Visit: Payer: Self-pay | Admitting: Internal Medicine

## 2016-08-31 DIAGNOSIS — E559 Vitamin D deficiency, unspecified: Secondary | ICD-10-CM

## 2016-08-31 DIAGNOSIS — E890 Postprocedural hypothyroidism: Secondary | ICD-10-CM

## 2016-09-18 ENCOUNTER — Ambulatory Visit (INDEPENDENT_AMBULATORY_CARE_PROVIDER_SITE_OTHER)
Admission: RE | Admit: 2016-09-18 | Discharge: 2016-09-18 | Disposition: A | Discharge status: Home / Self Care | Payer: BC Managed Care – PPO | Source: Ambulatory Visit | Attending: Internal Medicine | Admitting: Internal Medicine

## 2016-09-18 DIAGNOSIS — M85869 Other specified disorders of bone density and structure, unspecified lower leg: Secondary | ICD-10-CM

## 2016-09-18 DIAGNOSIS — M85861 Other specified disorders of bone density and structure, right lower leg: Secondary | ICD-10-CM | POA: Diagnosis not present

## 2016-09-18 DIAGNOSIS — Z13820 Encounter for screening for osteoporosis: Secondary | ICD-10-CM

## 2016-09-19 LAB — HM MAMMOGRAPHY

## 2016-09-24 ENCOUNTER — Encounter: Payer: Self-pay | Admitting: Internal Medicine

## 2016-09-24 DIAGNOSIS — M85861 Other specified disorders of bone density and structure, right lower leg: Secondary | ICD-10-CM | POA: Diagnosis not present

## 2016-09-25 ENCOUNTER — Encounter: Payer: Self-pay | Admitting: Internal Medicine

## 2017-01-03 ENCOUNTER — Other Ambulatory Visit: Payer: Self-pay

## 2017-01-03 ENCOUNTER — Encounter: Payer: Self-pay | Admitting: Family Medicine

## 2017-01-03 ENCOUNTER — Ambulatory Visit: Payer: Medicare Other | Admitting: Family Medicine

## 2017-01-03 VITALS — BP 120/72 | HR 70 | Temp 98.5°F | Resp 16 | Ht 64.75 in | Wt 188.6 lb

## 2017-01-03 DIAGNOSIS — J209 Acute bronchitis, unspecified: Secondary | ICD-10-CM

## 2017-01-03 DIAGNOSIS — J069 Acute upper respiratory infection, unspecified: Secondary | ICD-10-CM

## 2017-01-03 DIAGNOSIS — R059 Cough, unspecified: Secondary | ICD-10-CM

## 2017-01-03 DIAGNOSIS — R05 Cough: Secondary | ICD-10-CM | POA: Diagnosis not present

## 2017-01-03 MED ORDER — AMOXICILLIN-POT CLAVULANATE 875-125 MG PO TABS: 1.0000 | ORAL_TABLET | Freq: Two times a day (BID) | ORAL | 0 refills | Status: DC

## 2017-01-03 MED ORDER — HYDROCOD POLST-CPM POLST ER 10-8 MG/5ML PO SUER: 5.0000 mL | Freq: Two times a day (BID) | ORAL | 0 refills | Status: DC | PRN

## 2017-01-03 MED ORDER — ALBUTEROL SULFATE HFA 108 (90 BASE) MCG/ACT IN AERS: 2.0000 | INHALATION_SPRAY | RESPIRATORY_TRACT | 1 refills | Status: DC | PRN

## 2017-01-03 NOTE — Patient Instructions (Addendum)
Drink plenty of fluids and get enough rest  Use the Tussionex cough syrup 1 teaspoon every 12 hours as needed for cough.  Will cause daytime drowsiness.  You may wish to instead use 1 of the over-the-counter DM-containing cough syrups in the daytime hours.  Use the albuterol inhaler 2 inhalations every 4-6 hours as directed  See how you are doing with using the inhaler and cough syrup.  If you are not improving in the next couple of days then go ahead and get the antibiotic prescription filled and begin it, but I am not beginning it initially since you are not bringing up any purulent looking phlegm.  If you do not end up using the antibiotic prescription please destroy it, because it is not good to keep medicine around for self treatment.  Augmentin 875 1 twice daily for infection, antibiotic to be taken as above.  You can continue using an over-the-counter antihistamine decongestant with the above if needed.  Return if not improving    IF you received an x-ray today, you will receive an invoice from [facility]. Please contact [facility] at [phone] with questions or concerns regarding your invoice.   IF you received labwork today, you will receive an invoice from [facility]. Please contact [facility] at [phone] with questions or concerns regarding your invoice.   Our billing staff will not be able to assist you with questions regarding bills from these companies.  You will be contacted with the lab results as soon as they are available. The fastest way to get your results is to activate your My Chart account. Instructions are located on the last page of this paperwork. If you have not heard from [place] regarding the results in 2 weeks, please contact this office.

## 2017-01-03 NOTE — Progress Notes (Signed)
Patient ID: Holly Cochran, female    DOB: 07-31-1950  Age: 66 y.o. MRN: [id number]  Chief Complaint  Patient presents with  . URI    cough, nasal congestion, hurts to cough , sxs started Friday     Subjective:   66 year old lady who developed a upper respiratory infection 6 days ago.  The cough started about 5 days ago.  She then flew to [place] and back, and flying seem to make things a little worse.  She has been coughing a lot day and night.  She has tried various OTC medications without relief.  She last had an infection like this about a year ago.  She is blowing out mostly clear mucus.  She has not been running fevers.  Just does not feel well.  She says that benzonatate does not help her cough, and would like 1 of the hydrocodone-containing cough syrups again.  Current allergies, medications, problem list, past/family and social histories reviewed.  Objective:  BP 120/72   Pulse 70   Temp 98.5 F (36.9 C)   Resp 16   Ht 5' 4.75" (1.645 m)   Wt 188 lb 9.6 oz (85.5 kg)   LMP 01/09/2005   SpO2 98%   BMI 31.63 kg/m   No major acute distress though she sniffles often and is coughing some.  Her TMs are normal.  Throat not erythematous.  Nose congested.  No major sinus tenderness.  Neck supple without significant nodes.  Chest has a few scattered rhonchi.  She says she has been wheezing some at night but I do not hear any wheezes right this moment.  Heart regular without murmur.  Assessment & Plan:   Assessment: 1. Acute bronchitis, unspecified organism   2. Cough   3. Acute upper respiratory infection       Plan: See instructions  No orders of the defined types were placed in this encounter.   No orders of the defined types were placed in this encounter.        Patient Instructions   Drink plenty of fluids and get enough rest  Use the Tussionex cough syrup 1 teaspoon every 12 hours as needed for cough.  Will cause daytime drowsiness.  You may  wish to instead use 1 of the over-the-counter DM-containing cough syrups in the daytime hours.  Use the albuterol inhaler 2 inhalations every 4-6 hours as directed  See how you are doing with using the inhaler and cough syrup.  If you are not improving in the next couple of days then go ahead and get the antibiotic prescription filled and begin it, but I am not beginning it initially since you are not bringing up any purulent looking phlegm.  If you do not end up using the antibiotic prescription please destroy it, because it is not good to keep medicine around for self treatment.  Augmentin 875 1 twice daily for infection, antibiotic to be taken as above.  You can continue using an over-the-counter antihistamine decongestant with the above if needed.  Return if not improving    IF you received an x-ray today, you will receive an invoice from [facility]. Please contact [facility] at [phone] with questions or concerns regarding your invoice.   IF you received labwork today, you will receive an invoice from [facility]. Please contact [facility] at [phone] with questions or concerns regarding your invoice.   Our billing staff will not be able to assist you with questions regarding bills from these  companies.  You will be contacted with the lab results as soon as they are available. The fastest way to get your results is to activate your My Chart account. Instructions are located on the last page of this paperwork. If you have not heard from [place] regarding the results in 2 weeks, please contact this office.         Return if symptoms worsen or fail to improve.   [name], MD 01/03/2017

## 2017-01-11 ENCOUNTER — Ambulatory Visit: Payer: Medicare Other | Admitting: Gynecology

## 2017-01-11 ENCOUNTER — Encounter: Payer: Self-pay | Admitting: Gynecology

## 2017-01-11 VITALS — BP 126/80

## 2017-01-11 DIAGNOSIS — N6459 Other signs and symptoms in breast: Secondary | ICD-10-CM

## 2017-01-11 NOTE — Progress Notes (Signed)
    Holly Cochran March 02, 1950 [id number]        67 y.o.  G0P0 presents to discuss worsening left breast greater than right breast enlargement.  Patient has always had her left breast larger than her right breast but after a traumatic fall last year where she bruised her left breast and had an impressive ecchymoses with hematoma she notes since resolution of the hematoma her left breast seems larger than the right.  She is also lost some weight which seems to have contributed to this size discrepancy.  Most recent mammographic follow-up in September was normal with recommended follow-up this coming February when she is on schedule for her annual mammography.  Past medical history,surgical history, problem list, medications, allergies, family history and social history were all reviewed and documented in the EPIC chart.  Directed ROS with pertinent positives and negatives documented in the history of present illness/assessment and plan.  Exam: Caryn Bee assistant Vitals:   01/11/17 1402  BP: 126/80   General appearance:  Normal Both breasts examined lying and sitting without mass, retractions, discharge, adenopathy.  Left breast is visibly larger than the right breast  Assessment/Plan:  67 y.o. G0P0 with breast size discrepancy.  Patient has always had this since thelarche but notes the discrepancy seems worse now following her breast trauma and weight loss.  She is contemplating breast surgery and wanted my opinion.  Exam is normal from a pathologic standpoint.  I did recommend following up for her mammogram in February.  I recommended she discuss possible surgery with a plastic surgeon and she already has several names that she has looked into and is going to make an appointment to talk to them in reference to the surgery.    Anastasio Auerbach MD, 2:40 PM 01/11/2017

## 2017-01-11 NOTE — Patient Instructions (Addendum)
Follow-up with the plastic surgeon as we discussed.

## 2017-01-26 ENCOUNTER — Telehealth: Payer: Self-pay | Admitting: Emergency Medicine

## 2017-01-26 NOTE — Telephone Encounter (Signed)
Received call from pt, pt states she dealt with her partner last Jan. 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 She just recently got back into a relationship that ended very abruptly. She would like a referral to a therapist and request a Female therapist that understands having a partner. She does not have someone in mind and would like your recommendation.

## 2017-01-28 NOTE — Telephone Encounter (Signed)
Have her look into this group - they have a website and she can look up individual counselors.    [place], [place]

## 2017-01-29 NOTE — Telephone Encounter (Signed)
Spoke with pt, she has found a counselor that a friend told her about and has an appt on Wednesday. She will contact her if things do not work out with Paediatric nurse.

## 2017-02-04 NOTE — Progress Notes (Signed)
Subjective:    Patient ID: Holly Cochran, female    DOB: 01-07-1951, 67 y.o.   MRN: [id number]  HPI Here for a welcome to medicare wellness exam and an annual physical exam.  I have personally reviewed and have noted 1.The patient's medical and social history 2.Their use of alcohol, tobacco or illicit drugs 3.Their current medications and supplements 4.The patient's functional ability including ADL's, fall risks, home                 safety risk and hearing or visual impairment. 5.Diet and physical activities 6.Evidence for depression or mood disorders 7.Care team reviewed  -  Gyn - Dr Phineas Real, Eye doctor   She did go through a recent break up and has been feeling down because of it.  She has been through a lot in the past few years.   Are there smokers in your home (other than you)? No  Risk Factors Exercise:   regular Dietary issues discussed:  Well balanced, has decreased portions  Vitamin and supplement use:  Vitamin d, fish oil  Opiod use:  none Side effects from medication:   N/a  Does medications benefits outweigh risks/side effects:   n'a  Cardiac risk factors: advanced age, hyperlipidemia  Depression Screen  Have you felt down, depressed or hopeless? Yes   Have you felt little interest or pleasure in doing things?  No  Activities of Daily Living In your present state of health, do you have any difficulty performing the following activities?:  Driving? No Managing money?  No Feeding yourself? No Getting from bed to chair? No Climbing a flight of stairs? No Preparing food and eating?: No Bathing or showering? No Getting dressed: No Getting to/using the toilet? No Moving around from place to place: No In the past year have you fallen or had a near fall?: No   Are you sexually active?  No  Do you have more than one partner?  N/A  Hearing Difficulties: No Do you often ask people to  speak up or repeat themselves? No Do you experience ringing or noises in your ears? No Do you have difficulty understanding soft or whispered voices? No  Vision:              Any change in vision:   no             Up to date with eye exam:    yes  Memory:  Do you feel that you have a problem with memory? No  Do you often misplace items? No  Do you feel safe at home?  Yes  Cognitive Testing  Alert, Orientated? Yes  Normal Appearance? Yes  Recall of three objects?  Yes  Can perform simple calculations? Yes  Displays appropriate judgment? Yes  Can read the correct time from a watch face? Yes   Advanced Directives have been discussed with the patient? Yes     Medications and allergies reviewed with patient and updated if appropriate.  Patient Active Problem List   Diagnosis Date Noted  . Vitamin D deficiency 08/31/2016  . Family history of diabetes mellitus in mother 01/30/2016  . Anxiety 03/02/2015  . Chronically dry eyes 07/20/2014  . Common peroneal neuropathy of left lower extremity 05/20/2014  . GERD (gastroesophageal reflux disease) 02/08/2011  . RHINITIS 11/24/2008  . GANGLION OF TENDON SHEATH 11/10/2008  . Hyperlipidemia 08/27/2007  . Osteopenia 08/27/2007  . Hypothyroidism, postradioiodine therapy 06/01/2006    Current Outpatient Medications on  File Prior to Visit  Medication Sig Dispense Refill  . aspirin 81 MG tablet Take 81 mg by mouth daily.      Marland Kitchen escitalopram (LEXAPRO) 20 MG tablet Take 1 tablet (20 mg total) by mouth daily. 90 tablet 3  . fish oil-omega-3 fatty acids 1000 MG capsule Take 2 g by mouth daily.      . fluticasone (FLONASE) 50 MCG/ACT nasal spray USE TWO SPRAY IN EACH NOSTRIL EVERY DAY 16 g 5  . levothyroxine (SYNTHROID, LEVOTHROID) 125 MCG tablet 1 qd three days a week and 1/2 four days a week 90 tablet 3  . pravastatin (PRAVACHOL) 40 MG tablet Take 1 tablet (40 mg total) by mouth at bedtime. 90 tablet 3  . pseudoephedrine (SUDAFED 12 HOUR)  120 MG 12 hr tablet Take 1 tablet (120 mg total) by mouth 2 (two) times daily. 20 tablet 0  . ranitidine (ZANTAC) 150 MG tablet Take 1 tablet (150 mg total) by mouth 2 (two) times daily. 180 tablet 3   No current facility-administered medications on file prior to visit.     Past Medical History:  Diagnosis Date  . Common peroneal neuropathy of left lower extremity 05/20/2014  . FASCIITIS, PLANTAR 10/14/2007   Qualifier: Diagnosis of  By: Linna Darner MD, Gwyndolyn Saxon    . GERD (gastroesophageal reflux disease)   . Hyperlipidemia   . Osteopenia 2015   T score -1.1  . Thyroid disease    hyperthyroid-RA I    Past Surgical History:  Procedure Laterality Date  . COLONOSCOPY  2005    Dr Sharlett Iles, negative  . PLANTAR FASCIA SURGERY  08/2010  . TONSILLECTOMY      Social History   Socioeconomic History  . Marital status: Single    Spouse name: None  . Number of children: None  . Years of education: None  . Highest education level: None  Social Needs  . Financial resource strain: None  . Food insecurity - worry: None  . Food insecurity - inability: None  . Transportation needs - medical: None  . Transportation needs - non-medical: None  Occupational History  . None  Tobacco Use  . Smoking status: Never Smoker  . Smokeless tobacco: Never Used  Substance and Sexual Activity  . Alcohol use: Yes    Alcohol/week: 0.6 oz    Types: 1 Standard drinks or equivalent per week    Comment: occasionally  . Drug use: No  . Sexual activity: No    Birth control/protection: Abstinence, Post-menopausal    Comment: Virgin  Other Topics Concern  . None  Social History Narrative  . None    Family History  Problem Relation Age of Onset  . Diabetes Mother   . Kidney disease Mother   . Hypertension Mother   . Cancer Father        Pancreatic  . Breast cancer Paternal Aunt        Age 48's  . Cancer Maternal Uncle        melanoma  . Diabetes Maternal Uncle   . Colon cancer Neg Hx   .  Esophageal cancer Neg Hx   . Stomach cancer Neg Hx   . Rectal cancer Neg Hx     Review of Systems  Constitutional: Negative for chills and fever.  Eyes: Negative for visual disturbance.  Respiratory: Positive for cough (minimal cough). Negative for shortness of breath and wheezing.   Cardiovascular: Positive for palpitations (with stress). Negative for chest pain and leg swelling.  Gastrointestinal:  Negative for abdominal pain, blood in stool, constipation, diarrhea and nausea.  Genitourinary: Negative for dysuria and hematuria.  Musculoskeletal: Negative for arthralgias and back pain.  Skin: Negative for color change and rash.  Neurological: Negative for light-headedness and headaches.  Psychiatric/Behavioral: Positive for dysphoric mood. Negative for sleep disturbance. The patient is not nervous/anxious.        Objective:   Vitals:   02/05/17 0750  BP: 122/78  Pulse: 73  Resp: 16  Temp: 97.9 F (36.6 C)  SpO2: 98%   Filed Weights   02/05/17 0750  Weight: 175 lb (79.4 kg)   Body mass index is 29.35 kg/m.  Wt Readings from Last 3 Encounters:  02/05/17 175 lb (79.4 kg)  01/03/17 188 lb 9.6 oz (85.5 kg)  03/03/16 191 lb (86.6 kg)    Visual Acuity Screening   Right eye Left eye Both eyes  Without correction:     With correction: 20/30 20/30 20/20      Physical Exam Constitutional: She appears well-developed and well-nourished. No distress.  HENT:  Head: Normocephalic and atraumatic.  Right Ear: External ear normal. Normal ear canal and TM Left Ear: External ear normal.  Normal ear canal and TM Mouth/Throat: Oropharynx is clear and moist.  Eyes: Conjunctivae and EOM are normal.  Neck: Neck supple. No tracheal deviation present. No thyromegaly present.  No carotid bruit  Cardiovascular: Normal rate, regular rhythm and normal heart sounds.   No murmur heard.  No edema. Pulmonary/Chest: Effort normal and breath sounds normal. No respiratory distress. She has no  wheezes. She has no rales.  Breast: deferred to Gyn Abdominal: Soft. She exhibits no distension. There is no tenderness.  Lymphadenopathy: She has no cervical adenopathy.  Skin: Skin is warm and dry. She is not diaphoretic. benign appearing growth/wart on posterior right knee Psychiatric: She has a normal mood and affect. Her behavior is normal.        Assessment & Plan:   Wellness Exam: Immunizations  prevnar today, discussed shingles, others up to date Colonoscopy    Up to date  Mammogram   Up to date  Gyn   Up to date  Dexa   Up to date  Eye exams   Up to date EKG: Done January 2018-no indication to repeat today Hearing loss  None - has been checked Memory concerns/difficulties   none Independent of ADLs   fully Stressed the importance of regular exercise   Patient received copy of preventative screening tests/immunizations recommended for the next 5-10 years.    Physical exam: Screening blood work   ordered Immunizations  prevnar due, discussed shingles, others up to date Colonoscopy    Up to date  Mammogram   Up to date  Gyn   Up to date  Dexa   Up to date  Eye exams   Up to date  EKG      Done 01/2016-no indication to repeat Exercise   regular Weight   Has lost weight over the past year Skin   No concerns Substance abuse   none  See Problem List for Assessment and Plan of chronic medical problems.    Follow-up annually, sooner if needed

## 2017-02-04 NOTE — Patient Instructions (Addendum)
Holly Cochran , Thank you for taking time to come for your Medicare Wellness Visit. I appreciate your ongoing commitment to your health goals. Please review the following plan we discussed and let me know if I can assist you in the future.   These are the goals we discussed: Goals    None      This is a list of the screening recommended for you and due dates:  Health Maintenance  Topic Date Due  . Pneumonia vaccines (1 of 2 - PCV13) today  . DEXA scan (bone density measurement)  09/19/2018  . Mammogram  09/20/2018  . Colon Cancer Screening  07/07/2024  . Tetanus Vaccine  01/30/2026  . Flu Shot  Completed  .  Hepatitis C: One time screening is recommended by Center for Disease Control  (CDC) for  adults born from [birth year] through 1964.   Completed  . HIV Screening  Completed    Test(s) ordered today. Your results will be released to [place] (or called to you) after review, usually within 72hours after test completion. If any changes need to be made, you will be notified at that same time.  All other Health Maintenance issues reviewed.   All recommended immunizations and age-appropriate screenings are up-to-date or discussed.  Prevnar pneumonia immunization administered today.   Medications reviewed and updated.  Changes include adding wellbutrin 150 mg daily.   Your prescription(s) have been submitted to your pharmacy. Please take as directed and contact our office if you believe you are having problem(s) with the medication(s).   Please followup in one year, sooner if needed   Health Maintenance, Female Adopting a healthy lifestyle and getting preventive care can go a long way to promote health and wellness. Talk with your health care provider about what schedule of regular examinations is right for you. This is a good chance for you to check in with your provider about disease prevention and staying healthy. In between checkups, there are plenty of things you can do on your own.  Experts have done a lot of research about which lifestyle changes and preventive measures are most likely to keep you healthy. Ask your health care provider for more information. Weight and diet Eat a healthy diet  Be sure to include plenty of vegetables, fruits, low-fat dairy products, and lean protein.  Do not eat a lot of foods high in solid fats, added sugars, or salt.  Get regular exercise. This is one of the most important things you can do for your health. ? Most adults should exercise for at least 150 minutes each week. The exercise should increase your heart rate and make you sweat (moderate-intensity exercise). ? Most adults should also do strengthening exercises at least twice a week. This is in addition to the moderate-intensity exercise.  Maintain a healthy weight  Body mass index (BMI) is a measurement that can be used to identify possible weight problems. It estimates body fat based on height and weight. Your health care provider can help determine your BMI and help you achieve or maintain a healthy weight.  For females 81 years of age and older: ? A BMI below 18.5 is considered underweight. ? A BMI of 18.5 to 24.9 is normal. ? A BMI of 25 to 29.9 is considered overweight. ? A BMI of 30 and above is considered obese.  Watch levels of cholesterol and blood lipids  You should start having your blood tested for lipids and cholesterol at 66 years of age, then  have this test every 5 years.  You may need to have your cholesterol levels checked more often if: ? Your lipid or cholesterol levels are high. ? You are older than 66 years of age. ? You are at high risk for heart disease.  Cancer screening Lung Cancer  Lung cancer screening is recommended for adults 72-20 years old who are at high risk for lung cancer because of a history of smoking.  A yearly low-dose CT scan of the lungs is recommended for people who: ? Currently smoke. ? Have quit within the past 15  years. ? Have at least a 30-pack-year history of smoking. A pack year is smoking an average of one pack of cigarettes a day for 1 year.  Yearly screening should continue until it has been 15 years since you quit.  Yearly screening should stop if you develop a health problem that would prevent you from having lung cancer treatment.  Breast Cancer  Practice breast self-awareness. This means understanding how your breasts normally appear and feel.  It also means doing regular breast self-exams. Let your health care provider know about any changes, no matter how small.  If you are in your 20s or 30s, you should have a clinical breast exam (CBE) by a health care provider every 1-3 years as part of a regular health exam.  If you are 33 or older, have a CBE every year. Also consider having a breast X-ray (mammogram) every year.  If you have a family history of breast cancer, talk to your health care provider about genetic screening.  If you are at high risk for breast cancer, talk to your health care provider about having an MRI and a mammogram every year.  Breast cancer gene (BRCA) assessment is recommended for women who have family members with BRCA-related cancers. BRCA-related cancers include: ? Breast. ? Ovarian. ? Tubal. ? Peritoneal cancers.  Results of the assessment will determine the need for genetic counseling and BRCA1 and BRCA2 testing.  Cervical Cancer Your health care provider may recommend that you be screened regularly for cancer of the pelvic organs (ovaries, uterus, and vagina). This screening involves a pelvic examination, including checking for microscopic changes to the surface of your cervix (Pap test). You may be encouraged to have this screening done every 3 years, beginning at age 61.  For women ages 32-65, health care providers may recommend pelvic exams and Pap testing every 3 years, or they may recommend the Pap and pelvic exam, combined with testing for human  papilloma virus (HPV), every 5 years. Some types of HPV increase your risk of cervical cancer. Testing for HPV may also be done on women of any age with unclear Pap test results.  Other health care providers may not recommend any screening for nonpregnant women who are considered low risk for pelvic cancer and who do not have symptoms. Ask your health care provider if a screening pelvic exam is right for you.  If you have had past treatment for cervical cancer or a condition that could lead to cancer, you need Pap tests and screening for cancer for at least 20 years after your treatment. If Pap tests have been discontinued, your risk factors (such as having a new sexual partner) need to be reassessed to determine if screening should resume. Some women have medical problems that increase the chance of getting cervical cancer. In these cases, your health care provider may recommend more frequent screening and Pap tests.  Colorectal Cancer  This type of cancer can be detected and often prevented.  Routine colorectal cancer screening usually begins at 66 years of age and continues through 66 years of age.  Your health care provider may recommend screening at an earlier age if you have risk factors for colon cancer.  Your health care provider may also recommend using home test kits to check for hidden blood in the stool.  A small camera at the end of a tube can be used to examine your colon directly (sigmoidoscopy or colonoscopy). This is done to check for the earliest forms of colorectal cancer.  Routine screening usually begins at age 90.  Direct examination of the colon should be repeated every 5-10 years through 66 years of age. However, you may need to be screened more often if early forms of precancerous polyps or small growths are found.  Skin Cancer  Check your skin from head to toe regularly.  Tell your health care provider about any new moles or changes in moles, especially if there is  a change in a mole's shape or color.  Also tell your health care provider if you have a mole that is larger than the size of a pencil eraser.  Always use sunscreen. Apply sunscreen liberally and repeatedly throughout the day.  Protect yourself by wearing long sleeves, pants, a wide-brimmed hat, and sunglasses whenever you are outside.  Heart disease, diabetes, and high blood pressure  High blood pressure causes heart disease and increases the risk of stroke. High blood pressure is more likely to develop in: ? People who have blood pressure in the high end of the normal range (130-139/85-89 mm Hg). ? People who are overweight or obese. ? People who are African American.  If you are 25-59 years of age, have your blood pressure checked every 3-5 years. If you are 56 years of age or older, have your blood pressure checked every year. You should have your blood pressure measured twice-once when you are at a hospital or clinic, and once when you are not at a hospital or clinic. Record the average of the two measurements. To check your blood pressure when you are not at a hospital or clinic, you can use: ? An automated blood pressure machine at a pharmacy. ? A home blood pressure monitor.  If you are between 34 years and 25 years old, ask your health care provider if you should take aspirin to prevent strokes.  Have regular diabetes screenings. This involves taking a blood sample to check your fasting blood sugar level. ? If you are at a normal weight and have a low risk for diabetes, have this test once every three years after 66 years of age. ? If you are overweight and have a high risk for diabetes, consider being tested at a younger age or more often. Preventing infection Hepatitis B  If you have a higher risk for hepatitis B, you should be screened for this virus. You are considered at high risk for hepatitis B if: ? You were born in a country where hepatitis B is common. Ask your health  care provider which countries are considered high risk. ? Your parents were born in a high-risk country, and you have not been immunized against hepatitis B (hepatitis B vaccine). ? You have HIV or AIDS. ? You use needles to inject street drugs. ? You live with someone who has hepatitis B. ? You have had sex with someone who has hepatitis B. ? You get hemodialysis  treatment. ? You take certain medicines for conditions, including cancer, organ transplantation, and autoimmune conditions.  Hepatitis C  Blood testing is recommended for: ? Everyone born from [birth year] through 1964. ? Anyone with known risk factors for hepatitis C.  Sexually transmitted infections (STIs)  You should be screened for sexually transmitted infections (STIs) including gonorrhea and chlamydia if: ? You are sexually active and are younger than 66 years of age. ? You are older than 66 years of age and your health care provider tells you that you are at risk for this type of infection. ? Your sexual activity has changed since you were last screened and you are at an increased risk for chlamydia or gonorrhea. Ask your health care provider if you are at risk.  If you do not have HIV, but are at risk, it may be recommended that you take a prescription medicine daily to prevent HIV infection. This is called pre-exposure prophylaxis (PrEP). You are considered at risk if: ? You are sexually active and do not regularly use condoms or know the HIV status of your partner(s). ? You take drugs by injection. ? You are sexually active with a partner who has HIV.  Talk with your health care provider about whether you are at high risk of being infected with HIV. If you choose to begin PrEP, you should first be tested for HIV. You should then be tested every 3 months for as long as you are taking PrEP. Pregnancy  If you are premenopausal and you may become pregnant, ask your health care provider about preconception counseling.  If you  may become pregnant, take 400 to 800 micrograms (mcg) of folic acid every day.  If you want to prevent pregnancy, talk to your health care provider about birth control (contraception). Osteoporosis and menopause  Osteoporosis is a disease in which the bones lose minerals and strength with aging. This can result in serious bone fractures. Your risk for osteoporosis can be identified using a bone density scan.  If you are 86 years of age or older, or if you are at risk for osteoporosis and fractures, ask your health care provider if you should be screened.  Ask your health care provider whether you should take a calcium or vitamin D supplement to lower your risk for osteoporosis.  Menopause may have certain physical symptoms and risks.  Hormone replacement therapy may reduce some of these symptoms and risks. Talk to your health care provider about whether hormone replacement therapy is right for you. Follow these instructions at home:  Schedule regular health, dental, and eye exams.  Stay current with your immunizations.  Do not use any tobacco products including cigarettes, chewing tobacco, or electronic cigarettes.  If you are pregnant, do not drink alcohol.  If you are breastfeeding, limit how much and how often you drink alcohol.  Limit alcohol intake to no more than 1 drink per day for nonpregnant women. One drink equals 12 ounces of beer, 5 ounces of wine, or 1 ounces of hard liquor.  Do not use street drugs.  Do not share needles.  Ask your health care provider for help if you need support or information about quitting drugs.  Tell your health care provider if you often feel depressed.  Tell your health care provider if you have ever been abused or do not feel safe at home. This information is not intended to replace advice given to you by your health care provider. Make sure you discuss any questions  you have with your health care provider. Document Released: 07/11/2010  Document Revised: 06/03/2015 Document Reviewed: 09/29/2014 Elsevier Interactive Patient Education  [employer].

## 2017-02-05 ENCOUNTER — Encounter: Payer: Self-pay | Admitting: Internal Medicine

## 2017-02-05 ENCOUNTER — Ambulatory Visit (INDEPENDENT_AMBULATORY_CARE_PROVIDER_SITE_OTHER): Payer: Medicare Other | Admitting: Internal Medicine

## 2017-02-05 ENCOUNTER — Other Ambulatory Visit (INDEPENDENT_AMBULATORY_CARE_PROVIDER_SITE_OTHER): Payer: Medicare Other

## 2017-02-05 VITALS — BP 122/78 | HR 73 | Temp 97.9°F | Resp 16 | Ht 64.75 in | Wt 175.0 lb

## 2017-02-05 DIAGNOSIS — E559 Vitamin D deficiency, unspecified: Secondary | ICD-10-CM

## 2017-02-05 DIAGNOSIS — F419 Anxiety disorder, unspecified: Secondary | ICD-10-CM | POA: Diagnosis not present

## 2017-02-05 DIAGNOSIS — K219 Gastro-esophageal reflux disease without esophagitis: Secondary | ICD-10-CM

## 2017-02-05 DIAGNOSIS — Z23 Encounter for immunization: Secondary | ICD-10-CM

## 2017-02-05 DIAGNOSIS — M85869 Other specified disorders of bone density and structure, unspecified lower leg: Secondary | ICD-10-CM | POA: Diagnosis not present

## 2017-02-05 DIAGNOSIS — E7849 Other hyperlipidemia: Secondary | ICD-10-CM

## 2017-02-05 DIAGNOSIS — E890 Postprocedural hypothyroidism: Secondary | ICD-10-CM

## 2017-02-05 DIAGNOSIS — E7800 Pure hypercholesterolemia, unspecified: Secondary | ICD-10-CM

## 2017-02-05 DIAGNOSIS — Z833 Family history of diabetes mellitus: Secondary | ICD-10-CM

## 2017-02-05 DIAGNOSIS — Z0000 Encounter for general adult medical examination without abnormal findings: Secondary | ICD-10-CM

## 2017-02-05 DIAGNOSIS — F4321 Adjustment disorder with depressed mood: Secondary | ICD-10-CM | POA: Diagnosis not present

## 2017-02-05 LAB — CBC WITH DIFFERENTIAL/PLATELET
BASOS PCT: 0.6 % (ref 0.0–3.0)
Basophils Absolute: 0.1 10*3/uL (ref 0.0–0.1)
EOS PCT: 0.6 % (ref 0.0–5.0)
Eosinophils Absolute: 0.1 10*3/uL (ref 0.0–0.7)
HCT: 46.2 % — ABNORMAL HIGH (ref 36.0–46.0)
HEMOGLOBIN: 15.5 g/dL — AB (ref 12.0–15.0)
Lymphocytes Relative: 16.7 % (ref 12.0–46.0)
Lymphs Abs: 1.4 10*3/uL (ref 0.7–4.0)
MCHC: 33.6 g/dL (ref 30.0–36.0)
MCV: 93.7 fl (ref 78.0–100.0)
MONO ABS: 0.7 10*3/uL (ref 0.1–1.0)
Monocytes Relative: 8.7 % (ref 3.0–12.0)
Neutro Abs: 6.2 10*3/uL (ref 1.4–7.7)
Neutrophils Relative %: 73.4 % (ref 43.0–77.0)
Platelets: 356 10*3/uL (ref 150.0–400.0)
RBC: 4.93 Mil/uL (ref 3.87–5.11)
RDW: 12.9 % (ref 11.5–15.5)
WBC: 8.5 10*3/uL (ref 4.0–10.5)

## 2017-02-05 LAB — LIPID PANEL
CHOLESTEROL: 119 mg/dL (ref 0–200)
HDL: 30.6 mg/dL — ABNORMAL LOW (ref >39.00)
LDL CALC: 61 mg/dL (ref 0–99)
NONHDL: 88.43
Total CHOL/HDL Ratio: 4
Triglycerides: 135 mg/dL (ref 0.0–149.0)
VLDL: 27 mg/dL (ref 0.0–40.0)

## 2017-02-05 LAB — COMPREHENSIVE METABOLIC PANEL
ALBUMIN: 4.6 g/dL (ref 3.5–5.2)
ALT: 13 U/L (ref 0–35)
AST: 16 U/L (ref 0–37)
Alkaline Phosphatase: 70 U/L (ref 39–117)
BUN: 10 mg/dL (ref 6–23)
CHLORIDE: 104 meq/L (ref 96–112)
CO2: 29 mEq/L (ref 19–32)
Calcium: 9.6 mg/dL (ref 8.4–10.5)
Creatinine, Ser: 0.79 mg/dL (ref 0.40–1.20)
GFR: 77.56 mL/min (ref >60.00)
Glucose, Bld: 100 mg/dL — ABNORMAL HIGH (ref 70–99)
POTASSIUM: 4.3 meq/L (ref 3.5–5.1)
SODIUM: 141 meq/L (ref 135–145)
Total Bilirubin: 1 mg/dL (ref 0.2–1.2)
Total Protein: 7.5 g/dL (ref 6.0–8.3)

## 2017-02-05 LAB — TSH: TSH: 3.32 u[IU]/mL (ref 0.35–4.50)

## 2017-02-05 LAB — HEMOGLOBIN A1C: Hgb A1c MFr Bld: 5.6 % (ref 4.6–6.5)

## 2017-02-05 LAB — VITAMIN D 25 HYDROXY (VIT D DEFICIENCY, FRACTURES): VITD: 40.2 ng/mL (ref 30.00–100.00)

## 2017-02-05 MED ORDER — LEVOTHYROXINE SODIUM 125 MCG PO TABS: ORAL_TABLET | ORAL | 3 refills | Status: DC

## 2017-02-05 MED ORDER — ESCITALOPRAM OXALATE 20 MG PO TABS: 20.0000 mg | ORAL_TABLET | Freq: Every day | ORAL | 3 refills | Status: DC

## 2017-02-05 MED ORDER — RANITIDINE HCL 150 MG PO TABS: 150.0000 mg | ORAL_TABLET | Freq: Two times a day (BID) | ORAL | 3 refills | Status: DC

## 2017-02-05 MED ORDER — PRAVASTATIN SODIUM 40 MG PO TABS: 40.0000 mg | ORAL_TABLET | Freq: Every day | ORAL | 3 refills | Status: DC

## 2017-02-05 MED ORDER — BUPROPION HCL ER (XL) 150 MG PO TB24: 150.0000 mg | ORAL_TABLET | Freq: Every day | ORAL | 0 refills | Status: DC

## 2017-02-05 NOTE — Assessment & Plan Note (Signed)
GERD controlled Continue daily medication  

## 2017-02-05 NOTE — Assessment & Plan Note (Signed)
Will check level ?

## 2017-02-05 NOTE — Assessment & Plan Note (Signed)
Related to relationship ending Continue lexapro Add Wellbutrin 150 mg daily

## 2017-02-05 NOTE — Assessment & Plan Note (Signed)
Check tsh  Titrate med dose if needed  

## 2017-02-05 NOTE — Assessment & Plan Note (Signed)
Check lipid panel  Continue daily statin Regular exercise and healthy diet encouraged  

## 2017-02-05 NOTE — Assessment & Plan Note (Signed)
Controlled Continue lexapro at current dose

## 2017-02-05 NOTE — Assessment & Plan Note (Signed)
dexa up to date Exercising Taking vitamin d and calcium

## 2017-02-05 NOTE — Assessment & Plan Note (Signed)
a1c

## 2017-02-20 ENCOUNTER — Other Ambulatory Visit: Payer: Self-pay | Admitting: Plastic Surgery

## 2017-03-06 ENCOUNTER — Ambulatory Visit: Payer: Medicare Other | Admitting: Psychology

## 2017-04-30 ENCOUNTER — Other Ambulatory Visit: Payer: Self-pay | Admitting: Internal Medicine

## 2017-06-01 ENCOUNTER — Other Ambulatory Visit: Payer: Self-pay | Admitting: Internal Medicine

## 2017-06-01 DIAGNOSIS — E890 Postprocedural hypothyroidism: Secondary | ICD-10-CM

## 2017-11-18 ENCOUNTER — Emergency Department (HOSPITAL_COMMUNITY): Payer: Medicare Other

## 2017-11-18 ENCOUNTER — Inpatient Hospital Stay (HOSPITAL_COMMUNITY): Payer: Medicare Other | Admitting: Certified Registered"

## 2017-11-18 ENCOUNTER — Inpatient Hospital Stay (HOSPITAL_COMMUNITY): Payer: Medicare Other

## 2017-11-18 ENCOUNTER — Encounter (HOSPITAL_COMMUNITY): Payer: Self-pay | Admitting: Emergency Medicine

## 2017-11-18 ENCOUNTER — Other Ambulatory Visit: Payer: Self-pay

## 2017-11-18 ENCOUNTER — Encounter (HOSPITAL_COMMUNITY)
Admission: EM | Disposition: A | Discharge status: Home / Self Care | Payer: Self-pay | Source: Home / Self Care | Attending: Internal Medicine

## 2017-11-18 ENCOUNTER — Inpatient Hospital Stay (HOSPITAL_COMMUNITY)
Admission: EM | Admit: 2017-11-18 | Discharge: 2017-11-21 | DRG: 480 | Disposition: A | Discharge status: Home Health | Payer: Medicare Other | Attending: Internal Medicine | Admitting: Internal Medicine

## 2017-11-18 DIAGNOSIS — Z7982 Long term (current) use of aspirin: Secondary | ICD-10-CM | POA: Diagnosis not present

## 2017-11-18 DIAGNOSIS — W06XXXA Fall from bed, initial encounter: Secondary | ICD-10-CM | POA: Diagnosis present

## 2017-11-18 DIAGNOSIS — S72145A Nondisplaced intertrochanteric fracture of left femur, initial encounter for closed fracture: Principal | ICD-10-CM | POA: Diagnosis present

## 2017-11-18 DIAGNOSIS — M5382 Other specified dorsopathies, cervical region: Secondary | ICD-10-CM | POA: Diagnosis present

## 2017-11-18 DIAGNOSIS — F419 Anxiety disorder, unspecified: Secondary | ICD-10-CM | POA: Diagnosis present

## 2017-11-18 DIAGNOSIS — D62 Acute posthemorrhagic anemia: Secondary | ICD-10-CM | POA: Diagnosis not present

## 2017-11-18 DIAGNOSIS — J9811 Atelectasis: Secondary | ICD-10-CM | POA: Diagnosis present

## 2017-11-18 DIAGNOSIS — J9601 Acute respiratory failure with hypoxia: Secondary | ICD-10-CM | POA: Diagnosis present

## 2017-11-18 DIAGNOSIS — M8580 Other specified disorders of bone density and structure, unspecified site: Secondary | ICD-10-CM | POA: Diagnosis present

## 2017-11-18 DIAGNOSIS — E785 Hyperlipidemia, unspecified: Secondary | ICD-10-CM | POA: Diagnosis present

## 2017-11-18 DIAGNOSIS — Z7951 Long term (current) use of inhaled steroids: Secondary | ICD-10-CM

## 2017-11-18 DIAGNOSIS — E0590 Thyrotoxicosis, unspecified without thyrotoxic crisis or storm: Secondary | ICD-10-CM | POA: Diagnosis present

## 2017-11-18 DIAGNOSIS — S72142A Displaced intertrochanteric fracture of left femur, initial encounter for closed fracture: Secondary | ICD-10-CM

## 2017-11-18 DIAGNOSIS — K219 Gastro-esophageal reflux disease without esophagitis: Secondary | ICD-10-CM | POA: Diagnosis present

## 2017-11-18 DIAGNOSIS — E7849 Other hyperlipidemia: Secondary | ICD-10-CM | POA: Diagnosis not present

## 2017-11-18 DIAGNOSIS — Z841 Family history of disorders of kidney and ureter: Secondary | ICD-10-CM | POA: Diagnosis not present

## 2017-11-18 DIAGNOSIS — E890 Postprocedural hypothyroidism: Secondary | ICD-10-CM | POA: Diagnosis present

## 2017-11-18 DIAGNOSIS — M810 Age-related osteoporosis without current pathological fracture: Secondary | ICD-10-CM | POA: Diagnosis present

## 2017-11-18 DIAGNOSIS — Z419 Encounter for procedure for purposes other than remedying health state, unspecified: Secondary | ICD-10-CM

## 2017-11-18 DIAGNOSIS — J189 Pneumonia, unspecified organism: Secondary | ICD-10-CM | POA: Diagnosis not present

## 2017-11-18 DIAGNOSIS — Z923 Personal history of irradiation: Secondary | ICD-10-CM

## 2017-11-18 DIAGNOSIS — J320 Chronic maxillary sinusitis: Secondary | ICD-10-CM | POA: Diagnosis present

## 2017-11-18 DIAGNOSIS — E871 Hypo-osmolality and hyponatremia: Secondary | ICD-10-CM | POA: Diagnosis present

## 2017-11-18 DIAGNOSIS — Z833 Family history of diabetes mellitus: Secondary | ICD-10-CM | POA: Diagnosis not present

## 2017-11-18 DIAGNOSIS — S72145S Nondisplaced intertrochanteric fracture of left femur, sequela: Secondary | ICD-10-CM | POA: Diagnosis not present

## 2017-11-18 DIAGNOSIS — F4321 Adjustment disorder with depressed mood: Secondary | ICD-10-CM | POA: Diagnosis present

## 2017-11-18 DIAGNOSIS — R52 Pain, unspecified: Secondary | ICD-10-CM

## 2017-11-18 DIAGNOSIS — Z79890 Hormone replacement therapy (postmenopausal): Secondary | ICD-10-CM

## 2017-11-18 DIAGNOSIS — Z803 Family history of malignant neoplasm of breast: Secondary | ICD-10-CM

## 2017-11-18 DIAGNOSIS — K5900 Constipation, unspecified: Secondary | ICD-10-CM | POA: Diagnosis present

## 2017-11-18 DIAGNOSIS — M4302 Spondylolysis, cervical region: Secondary | ICD-10-CM | POA: Diagnosis present

## 2017-11-18 DIAGNOSIS — Z8249 Family history of ischemic heart disease and other diseases of the circulatory system: Secondary | ICD-10-CM | POA: Diagnosis not present

## 2017-11-18 DIAGNOSIS — D72829 Elevated white blood cell count, unspecified: Secondary | ICD-10-CM

## 2017-11-18 HISTORY — PX: INTRAMEDULLARY (IM) NAIL INTERTROCHANTERIC: SHX5875

## 2017-11-18 LAB — CBC WITH DIFFERENTIAL/PLATELET
ABS IMMATURE GRANULOCYTES: 0.06 10*3/uL (ref 0.00–0.07)
BASOS PCT: 1 %
Basophils Absolute: 0.1 10*3/uL (ref 0.0–0.1)
Eosinophils Absolute: 0.1 10*3/uL (ref 0.0–0.5)
Eosinophils Relative: 1 %
HCT: 44.8 % (ref 36.0–46.0)
HEMOGLOBIN: 14.9 g/dL (ref 12.0–15.0)
IMMATURE GRANULOCYTES: 1 %
LYMPHS PCT: 27 %
Lymphs Abs: 2.3 10*3/uL (ref 0.7–4.0)
MCH: 32.2 pg (ref 26.0–34.0)
MCHC: 33.3 g/dL (ref 30.0–36.0)
MCV: 96.8 fL (ref 80.0–100.0)
MONOS PCT: 8 %
Monocytes Absolute: 0.7 10*3/uL (ref 0.1–1.0)
NEUTROS ABS: 5.2 10*3/uL (ref 1.7–7.7)
NEUTROS PCT: 62 %
PLATELETS: 291 10*3/uL (ref 150–400)
RBC: 4.63 MIL/uL (ref 3.87–5.11)
RDW: 11.9 % (ref 11.5–15.5)
WBC: 8.4 10*3/uL (ref 4.0–10.5)
nRBC: 0 % (ref 0.0–0.2)

## 2017-11-18 LAB — BASIC METABOLIC PANEL
ANION GAP: 10 (ref 5–15)
BUN: 14 mg/dL (ref 8–23)
CHLORIDE: 104 mmol/L (ref 98–111)
CO2: 22 mmol/L (ref 22–32)
Calcium: 8.9 mg/dL (ref 8.9–10.3)
Creatinine, Ser: 0.62 mg/dL (ref 0.44–1.00)
Glucose, Bld: 117 mg/dL — ABNORMAL HIGH (ref 70–99)
Potassium: 3.7 mmol/L (ref 3.5–5.1)
SODIUM: 136 mmol/L (ref 135–145)

## 2017-11-18 LAB — HIV ANTIBODY (ROUTINE TESTING W REFLEX): HIV Screen 4th Generation wRfx: NONREACTIVE

## 2017-11-18 SURGERY — FIXATION, FRACTURE, INTERTROCHANTERIC, WITH INTRAMEDULLARY ROD
Anesthesia: General | Site: Hip | Laterality: Left

## 2017-11-18 MED ORDER — DEXAMETHASONE SODIUM PHOSPHATE 10 MG/ML IJ SOLN
INTRAMUSCULAR | Status: DC | PRN
  Administered 2017-11-18: 10 mg via INTRAVENOUS

## 2017-11-18 MED ORDER — BUPIVACAINE HCL (PF) 0.5 % IJ SOLN
INTRAMUSCULAR | Status: AC
  Filled 2017-11-18: qty 30

## 2017-11-18 MED ORDER — PROPOFOL 10 MG/ML IV BOLUS
INTRAVENOUS | Status: DC | PRN
  Administered 2017-11-18: 100 mg via INTRAVENOUS

## 2017-11-18 MED ORDER — ONDANSETRON HCL 4 MG/2ML IJ SOLN: 4.0000 mg | Freq: Four times a day (QID) | INTRAMUSCULAR | Status: DC | PRN

## 2017-11-18 MED ORDER — CEFAZOLIN SODIUM-DEXTROSE 2-4 GM/100ML-% IV SOLN
2.0000 g | INTRAVENOUS | Status: AC
  Administered 2017-11-18: 2 g via INTRAVENOUS

## 2017-11-18 MED ORDER — OXYCODONE HCL 5 MG/5ML PO SOLN
5.0000 mg | Freq: Once | ORAL | Status: DC | PRN
  Filled 2017-11-18: qty 5

## 2017-11-18 MED ORDER — TRAMADOL HCL 50 MG PO TABS
50.0000 mg | ORAL_TABLET | Freq: Four times a day (QID) | ORAL | Status: DC
  Administered 2017-11-18 – 2017-11-20 (×4): 50 mg via ORAL
  Filled 2017-11-18 (×4): qty 1

## 2017-11-18 MED ORDER — MIDAZOLAM HCL 2 MG/2ML IJ SOLN
INTRAMUSCULAR | Status: AC
  Filled 2017-11-18: qty 2

## 2017-11-18 MED ORDER — MAGNESIUM CITRATE PO SOLN: 1.0000 | Freq: Once | ORAL | Status: DC | PRN

## 2017-11-18 MED ORDER — FENTANYL CITRATE (PF) 100 MCG/2ML IJ SOLN
25.0000 ug | INTRAMUSCULAR | Status: DC | PRN
  Administered 2017-11-18: 25 ug via INTRAVENOUS

## 2017-11-18 MED ORDER — ASPIRIN EC 325 MG PO TBEC
325.0000 mg | DELAYED_RELEASE_TABLET | Freq: Two times a day (BID) | ORAL | Status: DC
  Administered 2017-11-18 – 2017-11-21 (×6): 325 mg via ORAL
  Filled 2017-11-18 (×6): qty 1

## 2017-11-18 MED ORDER — OXYCODONE HCL 5 MG PO TABS: 5.0000 mg | ORAL_TABLET | Freq: Once | ORAL | Status: DC | PRN

## 2017-11-18 MED ORDER — ONDANSETRON HCL 4 MG PO TABS: 4.0000 mg | ORAL_TABLET | Freq: Four times a day (QID) | ORAL | Status: DC | PRN

## 2017-11-18 MED ORDER — FENTANYL CITRATE (PF) 100 MCG/2ML IJ SOLN
INTRAMUSCULAR | Status: AC
  Filled 2017-11-18: qty 2

## 2017-11-18 MED ORDER — MIDAZOLAM HCL 5 MG/5ML IJ SOLN
INTRAMUSCULAR | Status: DC | PRN
  Administered 2017-11-18: 2 mg via INTRAVENOUS

## 2017-11-18 MED ORDER — LIDOCAINE 2% (20 MG/ML) 5 ML SYRINGE
INTRAMUSCULAR | Status: DC | PRN
  Administered 2017-11-18: 100 mg via INTRAVENOUS

## 2017-11-18 MED ORDER — MORPHINE SULFATE (PF) 2 MG/ML IV SOLN
2.0000 mg | INTRAVENOUS | Status: DC | PRN
  Administered 2017-11-18 – 2017-11-20 (×2): 2 mg via INTRAVENOUS
  Filled 2017-11-18 (×2): qty 1

## 2017-11-18 MED ORDER — FENTANYL CITRATE (PF) 100 MCG/2ML IJ SOLN
50.0000 ug | Freq: Once | INTRAMUSCULAR | Status: AC
  Administered 2017-11-18: 50 ug via INTRAMUSCULAR
  Filled 2017-11-18: qty 2

## 2017-11-18 MED ORDER — ESCITALOPRAM OXALATE 20 MG PO TABS
20.0000 mg | ORAL_TABLET | Freq: Every day | ORAL | Status: DC
  Administered 2017-11-19 – 2017-11-21 (×3): 20 mg via ORAL
  Filled 2017-11-18 (×3): qty 1

## 2017-11-18 MED ORDER — CEFAZOLIN SODIUM-DEXTROSE 2-4 GM/100ML-% IV SOLN
2.0000 g | Freq: Four times a day (QID) | INTRAVENOUS | Status: AC
  Administered 2017-11-18 (×2): 2 g via INTRAVENOUS
  Filled 2017-11-18 (×2): qty 100

## 2017-11-18 MED ORDER — DEXTROSE-NACL 5-0.45 % IV SOLN
INTRAVENOUS | Status: DC
  Administered 2017-11-19: 06:00:00 via INTRAVENOUS

## 2017-11-18 MED ORDER — METHOCARBAMOL 500 MG PO TABS
500.0000 mg | ORAL_TABLET | Freq: Four times a day (QID) | ORAL | Status: DC | PRN
  Administered 2017-11-18 – 2017-11-21 (×6): 500 mg via ORAL
  Filled 2017-11-18 (×6): qty 1

## 2017-11-18 MED ORDER — OXYCODONE HCL 5 MG PO TABS
5.0000 mg | ORAL_TABLET | ORAL | Status: DC | PRN
  Administered 2017-11-18 – 2017-11-21 (×12): 10 mg via ORAL
  Filled 2017-11-18 (×13): qty 2

## 2017-11-18 MED ORDER — METHOCARBAMOL 500 MG IVPB - SIMPLE MED
500.0000 mg | Freq: Four times a day (QID) | INTRAVENOUS | Status: DC | PRN
  Filled 2017-11-18: qty 50

## 2017-11-18 MED ORDER — CHLORHEXIDINE GLUCONATE 4 % EX LIQD: 60.0000 mL | Freq: Once | CUTANEOUS | Status: DC

## 2017-11-18 MED ORDER — PRAVASTATIN SODIUM 20 MG PO TABS
40.0000 mg | ORAL_TABLET | Freq: Every day | ORAL | Status: DC
  Administered 2017-11-18 – 2017-11-20 (×3): 40 mg via ORAL
  Filled 2017-11-18 (×3): qty 2

## 2017-11-18 MED ORDER — FAMOTIDINE 20 MG PO TABS
20.0000 mg | ORAL_TABLET | Freq: Every day | ORAL | Status: DC
  Administered 2017-11-19 – 2017-11-21 (×3): 20 mg via ORAL
  Filled 2017-11-18 (×3): qty 1

## 2017-11-18 MED ORDER — LEVOTHYROXINE SODIUM 125 MCG PO TABS
125.0000 ug | ORAL_TABLET | Freq: Every day | ORAL | Status: DC
  Administered 2017-11-19 – 2017-11-21 (×3): 125 ug via ORAL
  Filled 2017-11-18 (×3): qty 1

## 2017-11-18 MED ORDER — METHOCARBAMOL 1000 MG/10ML IJ SOLN
500.0000 mg | Freq: Three times a day (TID) | INTRAVENOUS | Status: DC | PRN
  Administered 2017-11-18: 500 mg via INTRAVENOUS
  Filled 2017-11-18 (×2): qty 5

## 2017-11-18 MED ORDER — FENTANYL CITRATE (PF) 100 MCG/2ML IJ SOLN: 50.0000 ug | Freq: Once | INTRAMUSCULAR | Status: DC

## 2017-11-18 MED ORDER — ASPIRIN EC 325 MG PO TBEC: 325.0000 mg | DELAYED_RELEASE_TABLET | Freq: Two times a day (BID) | ORAL | 0 refills | Status: DC

## 2017-11-18 MED ORDER — DEXAMETHASONE SODIUM PHOSPHATE 10 MG/ML IJ SOLN
INTRAMUSCULAR | Status: AC
  Filled 2017-11-18: qty 1

## 2017-11-18 MED ORDER — OXYCODONE-ACETAMINOPHEN 5-325 MG PO TABS: 1.0000 | ORAL_TABLET | Freq: Four times a day (QID) | ORAL | 0 refills | Status: DC | PRN

## 2017-11-18 MED ORDER — FENTANYL CITRATE (PF) 100 MCG/2ML IJ SOLN
50.0000 ug | Freq: Once | INTRAMUSCULAR | Status: AC
  Administered 2017-11-18: 50 ug via INTRAVENOUS
  Filled 2017-11-18: qty 2

## 2017-11-18 MED ORDER — PROPOFOL 10 MG/ML IV BOLUS
INTRAVENOUS | Status: AC
  Filled 2017-11-18: qty 20

## 2017-11-18 MED ORDER — LIDOCAINE 2% (20 MG/ML) 5 ML SYRINGE
INTRAMUSCULAR | Status: AC
  Filled 2017-11-18: qty 5

## 2017-11-18 MED ORDER — ACETAMINOPHEN 325 MG PO TABS
325.0000 mg | ORAL_TABLET | Freq: Four times a day (QID) | ORAL | Status: DC | PRN
  Administered 2017-11-19: 650 mg via ORAL
  Filled 2017-11-18: qty 2

## 2017-11-18 MED ORDER — HYDROMORPHONE HCL 1 MG/ML IJ SOLN
1.0000 mg | Freq: Once | INTRAMUSCULAR | Status: AC
  Administered 2017-11-18: 1 mg via INTRAVENOUS
  Filled 2017-11-18: qty 1

## 2017-11-18 MED ORDER — PNEUMOCOCCAL VAC POLYVALENT 25 MCG/0.5ML IJ INJ
0.5000 mL | INJECTION | INTRAMUSCULAR | Status: DC
  Filled 2017-11-18: qty 0.5

## 2017-11-18 MED ORDER — FENTANYL CITRATE (PF) 100 MCG/2ML IJ SOLN
INTRAMUSCULAR | Status: DC | PRN
  Administered 2017-11-18 (×2): 50 ug via INTRAVENOUS

## 2017-11-18 MED ORDER — CEFAZOLIN SODIUM-DEXTROSE 2-4 GM/100ML-% IV SOLN
INTRAVENOUS | Status: AC
  Filled 2017-11-18: qty 100

## 2017-11-18 MED ORDER — ONDANSETRON HCL 4 MG/2ML IJ SOLN
4.0000 mg | Freq: Once | INTRAMUSCULAR | Status: AC
  Administered 2017-11-18: 4 mg via INTRAVENOUS
  Filled 2017-11-18: qty 2

## 2017-11-18 MED ORDER — SUGAMMADEX SODIUM 500 MG/5ML IV SOLN
INTRAVENOUS | Status: DC | PRN
  Administered 2017-11-18: 200 mg via INTRAVENOUS

## 2017-11-18 MED ORDER — POLYETHYLENE GLYCOL 3350 17 G PO PACK: 17.0000 g | PACK | Freq: Every day | ORAL | Status: DC | PRN

## 2017-11-18 MED ORDER — ACETAMINOPHEN 500 MG PO TABS
1000.0000 mg | ORAL_TABLET | Freq: Four times a day (QID) | ORAL | Status: AC
  Administered 2017-11-18 – 2017-11-19 (×4): 1000 mg via ORAL
  Filled 2017-11-18 (×5): qty 2

## 2017-11-18 MED ORDER — ALUM & MAG HYDROXIDE-SIMETH 200-200-20 MG/5ML PO SUSP
30.0000 mL | ORAL | Status: DC | PRN
  Administered 2017-11-21: 30 mL via ORAL
  Filled 2017-11-18: qty 30

## 2017-11-18 MED ORDER — INFLUENZA VAC SPLIT HIGH-DOSE 0.5 ML IM SUSY
0.5000 mL | PREFILLED_SYRINGE | INTRAMUSCULAR | Status: DC
  Filled 2017-11-18: qty 0.5

## 2017-11-18 MED ORDER — BISACODYL 5 MG PO TBEC: 5.0000 mg | DELAYED_RELEASE_TABLET | Freq: Every day | ORAL | Status: DC | PRN

## 2017-11-18 MED ORDER — ROCURONIUM BROMIDE 10 MG/ML (PF) SYRINGE
PREFILLED_SYRINGE | INTRAVENOUS | Status: AC
  Filled 2017-11-18: qty 10

## 2017-11-18 MED ORDER — ROCURONIUM BROMIDE 10 MG/ML (PF) SYRINGE
PREFILLED_SYRINGE | INTRAVENOUS | Status: DC | PRN
  Administered 2017-11-18: 50 mg via INTRAVENOUS

## 2017-11-18 MED ORDER — DOCUSATE SODIUM 100 MG PO CAPS
100.0000 mg | ORAL_CAPSULE | Freq: Two times a day (BID) | ORAL | Status: DC
  Administered 2017-11-18 – 2017-11-20 (×4): 100 mg via ORAL
  Filled 2017-11-18 (×4): qty 1

## 2017-11-18 MED ORDER — FENTANYL CITRATE (PF) 100 MCG/2ML IJ SOLN
50.0000 ug | Freq: Once | INTRAMUSCULAR | Status: AC
  Administered 2017-11-18: 50 ug via INTRAVENOUS

## 2017-11-18 MED ORDER — 0.9 % SODIUM CHLORIDE (POUR BTL) OPTIME
TOPICAL | Status: DC | PRN
  Administered 2017-11-18: 1000 mL

## 2017-11-18 MED ORDER — POVIDONE-IODINE 10 % EX SWAB: 2.0000 "application " | Freq: Once | CUTANEOUS | Status: DC

## 2017-11-18 MED ORDER — LACTATED RINGERS IV SOLN
INTRAVENOUS | Status: DC | PRN
  Administered 2017-11-18: 10:00:00 via INTRAVENOUS

## 2017-11-18 MED ORDER — ONDANSETRON HCL 4 MG/2ML IJ SOLN
INTRAMUSCULAR | Status: AC
  Filled 2017-11-18: qty 2

## 2017-11-18 MED ORDER — ONDANSETRON HCL 4 MG/2ML IJ SOLN
INTRAMUSCULAR | Status: DC | PRN
  Administered 2017-11-18: 4 mg via INTRAVENOUS

## 2017-11-18 MED ORDER — BUPIVACAINE HCL (PF) 0.5 % IJ SOLN
INTRAMUSCULAR | Status: DC | PRN
  Administered 2017-11-18: 20 mL

## 2017-11-18 MED ORDER — FENTANYL CITRATE (PF) 100 MCG/2ML IJ SOLN
50.0000 ug | Freq: Once | INTRAMUSCULAR | Status: DC
  Filled 2017-11-18: qty 2

## 2017-11-18 SURGICAL SUPPLY — 35 items
BAG ZIPLOCK 12X15 (MISCELLANEOUS) ×3 IMPLANT
BIT DRILL 4.3MMS DISTAL GRDTED (BIT) ×1 IMPLANT
CORTICAL BONE SCR 5.0MM X 48MM (Screw) ×3 IMPLANT
COVER MAYO STAND STRL (DRAPES) ×3 IMPLANT
COVER SURGICAL LIGHT HANDLE (MISCELLANEOUS) ×3 IMPLANT
DRILL 4.3MMS DISTAL GRADUATED (BIT) ×3
DRSG MEPILEX BORDER 4X4 (GAUZE/BANDAGES/DRESSINGS) ×9 IMPLANT
DURAPREP 26ML APPLICATOR (WOUND CARE) ×3 IMPLANT
ELECT REM PT RETURN 15FT ADLT (MISCELLANEOUS) ×3 IMPLANT
GAUZE XEROFORM 1X8 LF (GAUZE/BANDAGES/DRESSINGS) ×3 IMPLANT
GLOVE BIO SURGEON STRL SZ8 (GLOVE) ×6 IMPLANT
GLOVE ECLIPSE 7.5 STRL STRAW (GLOVE) ×6 IMPLANT
GOWN STRL REUS W/ TWL XL LVL3 (GOWN DISPOSABLE) ×1 IMPLANT
GOWN STRL REUS W/TWL LRG LVL3 (GOWN DISPOSABLE) ×3 IMPLANT
GOWN STRL REUS W/TWL XL LVL3 (GOWN DISPOSABLE) ×2
GUIDEPIN 3.2X17.5 THRD DISP (PIN) ×3 IMPLANT
GUIDEWIRE BALL NOSE 80CM (WIRE) ×3 IMPLANT
KIT BASIN OR (CUSTOM PROCEDURE TRAY) ×3 IMPLANT
MANIFOLD NEPTUNE II (INSTRUMENTS) ×3 IMPLANT
NAIL AFFIXUS 13X380MM (Nail) ×3 IMPLANT
NS IRRIG 1000ML POUR BTL (IV SOLUTION) ×3 IMPLANT
PACK TOTAL JOINT (CUSTOM PROCEDURE TRAY) ×3 IMPLANT
POSITIONER SURGICAL ARM (MISCELLANEOUS) ×3 IMPLANT
SCREW BONE CORTICAL 5.0X44 (Screw) ×3 IMPLANT
SCREW BONE CORTICAL 5.0X52 (Screw) ×3 IMPLANT
SCREW CORTICL BON 5.0MM X 48MM (Screw) ×1 IMPLANT
SCREW LAG 10.5MMX105MM HFN (Screw) ×3 IMPLANT
STAPLER VISISTAT 35W (STAPLE) ×3 IMPLANT
SUT VIC AB 0 CT1 27 (SUTURE) ×2
SUT VIC AB 0 CT1 27XBRD ANTBC (SUTURE) ×1 IMPLANT
SUT VIC AB 2-0 CT1 27 (SUTURE) ×4
SUT VIC AB 2-0 CT1 TAPERPNT 27 (SUTURE) ×2 IMPLANT
TOWEL OR 17X26 10 PK STRL BLUE (TOWEL DISPOSABLE) ×3 IMPLANT
TOWEL OR NON WOVEN STRL DISP B (DISPOSABLE) ×3 IMPLANT
TRAY FOLEY MTR SLVR 16FR STAT (SET/KITS/TRAYS/PACK) ×3 IMPLANT

## 2017-11-18 NOTE — ED Notes (Signed)
Patient put on 2L [place].

## 2017-11-18 NOTE — Consult Note (Signed)
Reason for Consult: Intertrochanteric hip fracture left Referring Physician: Hospitalist  Holly Cochran is an 67 y.o. female.  HPI: Patient is a 67 year old otherwise healthy female who fell earlier today she suffered an intertrochanteric hip fracture and we are consulted for management of her hip fracture.  She will be admitted to the medical service and cleared for surgery and will take to the operating room for intramedullary rod fixation.  The patient is a full community ambulator who lives independently.  Past Medical History:  Diagnosis Date  . Common peroneal neuropathy of left lower extremity 05/20/2014  . FASCIITIS, PLANTAR 10/14/2007   Qualifier: Diagnosis of  By: Linna Darner MD, Gwyndolyn Saxon    . GERD (gastroesophageal reflux disease)   . Hyperlipidemia   . Osteopenia 2015   T score -1.1  . Thyroid disease    hyperthyroid-RA I    Past Surgical History:  Procedure Laterality Date  . COLONOSCOPY  2005    Dr Sharlett Iles, negative  . PLANTAR FASCIA SURGERY  08/2010  . TONSILLECTOMY      Family History  Problem Relation Age of Onset  . Diabetes Mother   . Kidney disease Mother   . Hypertension Mother   . Cancer Father        Pancreatic  . Breast cancer Paternal Aunt        Age 101's  . Cancer Maternal Uncle        melanoma  . Diabetes Maternal Uncle   . Colon cancer Neg Hx   . Esophageal cancer Neg Hx   . Stomach cancer Neg Hx   . Rectal cancer Neg Hx     Social History:  reports that she has never smoked. She has never used smokeless tobacco. She reports that she drinks about 1.0 standard drinks of alcohol per week. She reports that she does not use drugs.  Allergies:  Allergies  Allergen Reactions  . Sulfa Antibiotics     Rash Because of a history of documented adverse serious drug reaction;Medi Alert bracelet  is recommended  . Sulfasalazine Anaphylaxis    Rash Because of a history of documented adverse serious drug reaction;Medi Alert braceletis recommended  .  Codone [Hydrocodone] Itching  . Sulfacetamide Sodium     Rash Because of a history of documented adverse serious drug reaction;Medi Alert braceletis recommended  . Prednisone Rash    REACTION: FUNGAL INFECTION-MOUTH (?) REACTION: FUNGAL INFECTION-MOUTH (?)    Medications: I have reviewed the patient's current medications.  Results for orders placed or performed during the hospital encounter of 11/18/17 (from the past 48 hour(s))  CBC with Differential     Status: None   Collection Time: 11/18/17  6:05 AM  Result Value Ref Range   WBC 8.4 4.0 - 10.5 K/uL   RBC 4.63 3.87 - 5.11 MIL/uL   Hemoglobin 14.9 12.0 - 15.0 g/dL   HCT 44.8 36.0 - 46.0 %   MCV 96.8 80.0 - 100.0 fL   MCH 32.2 26.0 - 34.0 pg   MCHC 33.3 30.0 - 36.0 g/dL   RDW 11.9 11.5 - 15.5 %   Platelets 291 150 - 400 K/uL   nRBC 0.0 0.0 - 0.2 %   Neutrophils Relative % 62 %   Neutro Abs 5.2 1.7 - 7.7 K/uL   Lymphocytes Relative 27 %   Lymphs Abs 2.3 0.7 - 4.0 K/uL   Monocytes Relative 8 %   Monocytes Absolute 0.7 0.1 - 1.0 K/uL   Eosinophils Relative 1 %  Eosinophils Absolute 0.1 0.0 - 0.5 K/uL   Basophils Relative 1 %   Basophils Absolute 0.1 0.0 - 0.1 K/uL   Immature Granulocytes 1 %   Abs Immature Granulocytes 0.06 0.00 - 0.07 K/uL    Comment: Performed at [facility], [place] [address] [place], [place] [address]  Basic metabolic panel     Status: Abnormal   Collection Time: 11/18/17  6:05 AM  Result Value Ref Range   Sodium 136 135 - 145 mmol/L   Potassium 3.7 3.5 - 5.1 mmol/L   Chloride 104 98 - 111 mmol/L   CO2 22 22 - 32 mmol/L   Glucose, Bld 117 (H) 70 - 99 mg/dL   BUN 14 8 - 23 mg/dL   Creatinine, Ser 0.62 0.44 - 1.00 mg/dL   Calcium 8.9 8.9 - 10.3 mg/dL   GFR calc non Af Amer >60 >60 mL/min   GFR calc Af Amer >60 >60 mL/min    Comment: (NOTE) The eGFR has been calculated using the CKD EPI equation. This calculation has not been validated in all clinical situations. eGFR's  persistently <60 mL/min signify possible Chronic Kidney Disease.    Anion gap 10 5 - 15    Comment: Performed at [facility], [place] [address] [place], [place] [address]    Ct Head Wo Contrast  Result Date: 11/18/2017 CLINICAL DATA:  Initial evaluation for acute trauma, fall. EXAM: CT HEAD WITHOUT CONTRAST CT MAXILLOFACIAL WITHOUT CONTRAST CT CERVICAL SPINE WITHOUT CONTRAST TECHNIQUE: Multidetector CT imaging of the head, cervical spine, and maxillofacial structures were performed using the standard protocol without intravenous contrast. Multiplanar CT image reconstructions of the cervical spine and maxillofacial structures were also generated. COMPARISON:  None available. FINDINGS: CT HEAD FINDINGS Brain: Generalized age-related cerebral atrophy with mild chronic small vessel ischemic disease. No acute intracranial hemorrhage. No acute large vessel territory infarct. No mass lesion, midline shift or mass effect. No hydrocephalus. No extra-axial fluid collection. Vascular: No hyperdense vessel. Scattered vascular calcifications noted within the carotid siphons. Skull: Scalp soft tissues and calvarium within normal limits. Other: Mastoid air cells are clear. CT MAXILLOFACIAL FINDINGS Osseous: Zygomatic arches intact. No acute maxillary fracture. Pterygoid plates intact. Nasal bones intact. Nasal septum relatively midline and intact. Visualized mandible intact. Mandibular condyles normally situated. No acute abnormality about the dentition. Orbits: Globes and orbital soft tissues within normal limits. Bony orbits intact. Sinuses: Maxillary sinuses nearly completely opacified, chronic in appearance. Paranasal sinuses are otherwise clear. Soft tissues: Soft tissue contusion/hematoma present at the left face. CT CERVICAL SPINE FINDINGS Alignment: Straightening of the normal cervical lordosis. No listhesis. Skull base and vertebrae: Skull base intact. Normal C1-2 articulations are preserved in  the dens is intact. Vertebral body heights maintained. No acute fracture. Soft tissues and spinal canal: Paraspinous soft tissues within normal limits. No abnormal prevertebral edema. Disc levels: Mild to moderate cervical spondylolysis at C4-5 through C6-7. Upper chest: Visualized upper chest demonstrates no acute finding. Partially visualized lung apices are grossly clear. Other: None. IMPRESSION: 1. No acute intracranial abnormality. 2. Mild age-related cerebral atrophy with chronic small vessel ischemic disease. 3. Soft tissue contusion/hematoma at the left face. 4. No acute maxillofacial fracture. 5. Chronic maxillary sinusitis. 6. No acute traumatic injury within the cervical spine. 7. Mild to moderate cervical spondylolysis at C4-5through C6-7. Electronically Signed   By: Jeannine Boga M.D.   On: 11/18/2017 06:52   Ct Cervical Spine Wo Contrast  Result Date: 11/18/2017 CLINICAL DATA:  Initial  evaluation for acute trauma, fall. EXAM: CT HEAD WITHOUT CONTRAST CT MAXILLOFACIAL WITHOUT CONTRAST CT CERVICAL SPINE WITHOUT CONTRAST TECHNIQUE: Multidetector CT imaging of the head, cervical spine, and maxillofacial structures were performed using the standard protocol without intravenous contrast. Multiplanar CT image reconstructions of the cervical spine and maxillofacial structures were also generated. COMPARISON:  None available. FINDINGS: CT HEAD FINDINGS Brain: Generalized age-related cerebral atrophy with mild chronic small vessel ischemic disease. No acute intracranial hemorrhage. No acute large vessel territory infarct. No mass lesion, midline shift or mass effect. No hydrocephalus. No extra-axial fluid collection. Vascular: No hyperdense vessel. Scattered vascular calcifications noted within the carotid siphons. Skull: Scalp soft tissues and calvarium within normal limits. Other: Mastoid air cells are clear. CT MAXILLOFACIAL FINDINGS Osseous: Zygomatic arches intact. No acute maxillary fracture.  Pterygoid plates intact. Nasal bones intact. Nasal septum relatively midline and intact. Visualized mandible intact. Mandibular condyles normally situated. No acute abnormality about the dentition. Orbits: Globes and orbital soft tissues within normal limits. Bony orbits intact. Sinuses: Maxillary sinuses nearly completely opacified, chronic in appearance. Paranasal sinuses are otherwise clear. Soft tissues: Soft tissue contusion/hematoma present at the left face. CT CERVICAL SPINE FINDINGS Alignment: Straightening of the normal cervical lordosis. No listhesis. Skull base and vertebrae: Skull base intact. Normal C1-2 articulations are preserved in the dens is intact. Vertebral body heights maintained. No acute fracture. Soft tissues and spinal canal: Paraspinous soft tissues within normal limits. No abnormal prevertebral edema. Disc levels: Mild to moderate cervical spondylolysis at C4-5 through C6-7. Upper chest: Visualized upper chest demonstrates no acute finding. Partially visualized lung apices are grossly clear. Other: None. IMPRESSION: 1. No acute intracranial abnormality. 2. Mild age-related cerebral atrophy with chronic small vessel ischemic disease. 3. Soft tissue contusion/hematoma at the left face. 4. No acute maxillofacial fracture. 5. Chronic maxillary sinusitis. 6. No acute traumatic injury within the cervical spine. 7. Mild to moderate cervical spondylolysis at C4-5through C6-7. Electronically Signed   By: Jeannine Boga M.D.   On: 11/18/2017 06:52   Dg Pelvis Portable  Result Date: 11/18/2017 CLINICAL DATA:  Initial evaluation for acute trauma, fall, left hip pain. EXAM: PORTABLE PELVIS 1-2 VIEWS COMPARISON:  None. FINDINGS: There is irregular lucency extending through the intertrochanteric region of the left hip, suspicious for possible acute nondisplaced fracture. Possible subtle cortical step-off at the lesser trochanter. Visualized femur otherwise intact. Visualized pelvis intact. No  acute soft tissue abnormality. IMPRESSION: Irregular lucency extending through the intertrochanteric region of the left femur, suspicious for acute nondisplaced fracture. Correlation with physical exam recommended. If clinical picture is equivocal for acute fracture, further assessment with dedicated cross-sectional imaging may be helpful for further evaluation. Electronically Signed   By: Jeannine Boga M.D.   On: 11/18/2017 06:30   Dg Femur Portable 1 View Left  Result Date: 11/18/2017 CLINICAL DATA:  Initial evaluation for acute trauma, fall. EXAM: LEFT FEMUR PORTABLE 1 VIEW COMPARISON:  None. FINDINGS: Proximal femur and hip not well assessed on this examination. Femur otherwise intact with no other acute fracture or dislocation. Degenerative changes noted about the knee. No acute soft tissue abnormality. IMPRESSION: 1. No acute osseous abnormality about the left femur. 2. Please note that the proximal femur and hip not well assessed on this examination, better seen on concomitant radiograph of the pelvis. Electronically Signed   By: Jeannine Boga M.D.   On: 11/18/2017 06:28   Ct Maxillofacial Wo Contrast  Result Date: 11/18/2017 CLINICAL DATA:  Initial evaluation for acute trauma, fall. EXAM:  CT HEAD WITHOUT CONTRAST CT MAXILLOFACIAL WITHOUT CONTRAST CT CERVICAL SPINE WITHOUT CONTRAST TECHNIQUE: Multidetector CT imaging of the head, cervical spine, and maxillofacial structures were performed using the standard protocol without intravenous contrast. Multiplanar CT image reconstructions of the cervical spine and maxillofacial structures were also generated. COMPARISON:  None available. FINDINGS: CT HEAD FINDINGS Brain: Generalized age-related cerebral atrophy with mild chronic small vessel ischemic disease. No acute intracranial hemorrhage. No acute large vessel territory infarct. No mass lesion, midline shift or mass effect. No hydrocephalus. No extra-axial fluid collection. Vascular: No  hyperdense vessel. Scattered vascular calcifications noted within the carotid siphons. Skull: Scalp soft tissues and calvarium within normal limits. Other: Mastoid air cells are clear. CT MAXILLOFACIAL FINDINGS Osseous: Zygomatic arches intact. No acute maxillary fracture. Pterygoid plates intact. Nasal bones intact. Nasal septum relatively midline and intact. Visualized mandible intact. Mandibular condyles normally situated. No acute abnormality about the dentition. Orbits: Globes and orbital soft tissues within normal limits. Bony orbits intact. Sinuses: Maxillary sinuses nearly completely opacified, chronic in appearance. Paranasal sinuses are otherwise clear. Soft tissues: Soft tissue contusion/hematoma present at the left face. CT CERVICAL SPINE FINDINGS Alignment: Straightening of the normal cervical lordosis. No listhesis. Skull base and vertebrae: Skull base intact. Normal C1-2 articulations are preserved in the dens is intact. Vertebral body heights maintained. No acute fracture. Soft tissues and spinal canal: Paraspinous soft tissues within normal limits. No abnormal prevertebral edema. Disc levels: Mild to moderate cervical spondylolysis at C4-5 through C6-7. Upper chest: Visualized upper chest demonstrates no acute finding. Partially visualized lung apices are grossly clear. Other: None. IMPRESSION: 1. No acute intracranial abnormality. 2. Mild age-related cerebral atrophy with chronic small vessel ischemic disease. 3. Soft tissue contusion/hematoma at the left face. 4. No acute maxillofacial fracture. 5. Chronic maxillary sinusitis. 6. No acute traumatic injury within the cervical spine. 7. Mild to moderate cervical spondylolysis at C4-5through C6-7. Electronically Signed   By: Jeannine Boga M.D.   On: 11/18/2017 06:52    ROS  ROS: I have reviewed the patient's review of systems thoroughly and there are no positive responses as relates to the HPI. Blood pressure (!) 146/81, pulse 90,  temperature 97.7 F (36.5 C), temperature source Oral, resp. rate 12, height 5' 5" (1.651 m), weight 80.3 kg, last menstrual period 01/09/2005, SpO2 95 %. Physical Exam Well-developed well-nourished patient in no acute distress. Alert and oriented x3 HEENT:within normal limits Cardiac: Regular rate and rhythm Pulmonary: Lungs clear to auscultation Abdomen: Soft and nontender.  Normal active bowel sounds  Musculoskeletal: (Left lower extremity: External rotation deformity pain with all range of motion neurovascular intact distally)  Assessment/Plan: 67 year old community ambulator who fell earlier today and came to the emergency room complaining of significant pain.  X-ray showed intratrochanteric hip fracture left side.  She will be admitted by the internal medicine service and cleared for surgery and once cleared will be taken for intramedullary rod fixation of intertrochanteric hip fracture left side.//I had a prolonged discussion with the patient and her friends today and the appropriate course of action will be intramedullary rod fixation.  The patient and her friends are well aware of the risks of surgery including bleeding, infection, failure of fixation, need for further surgery, and the slight risk of death and around the time of surgery.  She is fully aware of the risks and wishes to proceed with surgical intervention.  John Maudie Mercury 11/18/2017, 9:00 AM

## 2017-11-18 NOTE — ED Notes (Signed)
Bed: QI16 Expected date:  Expected time:  Means of arrival:  Comments: Ems 67 yo female fall left hip pain 8/10-hematoma left side of face-no LOC-slipped on rug getting out of bed

## 2017-11-18 NOTE — Transfer of Care (Signed)
Immediate Anesthesia Transfer of Care Note  Patient: Holly Cochran NEEDS  Procedure(s) Performed: INTRAMEDULLARY (IM) NAIL INTERTROCHANTRIC (Left Hip)  Patient Location: PACU  Anesthesia Type:General  Level of Consciousness: awake, alert  and oriented  Airway & Oxygen Therapy: Patient Spontanous Breathing and Patient connected to nasal cannula oxygen  Post-op Assessment: Report given to RN and Post -op Vital signs reviewed and stable  Post vital signs: Reviewed and stable  Last Vitals:  Vitals Value Taken Time  BP    Temp    Pulse 98 11/18/2017 11:19 AM  Resp 10 11/18/2017 11:19 AM  SpO2 94 % 11/18/2017 11:19 AM  Vitals shown include unvalidated device data.  Last Pain:  Vitals:   11/18/17 0832  TempSrc:   PainSc: 7       Patients Stated Pain Goal: 0 (51/70/01 [id number])  Complications: No apparent anesthesia complications

## 2017-11-18 NOTE — Anesthesia Postprocedure Evaluation (Signed)
Anesthesia Post Note  Patient: Holly Cochran  Procedure(s) Performed: INTRAMEDULLARY (IM) NAIL INTERTROCHANTRIC (Left Hip)     Patient location during evaluation: PACU Anesthesia Type: General Level of consciousness: awake and alert Pain management: pain level controlled Vital Signs Assessment: post-procedure vital signs reviewed and stable Respiratory status: spontaneous breathing, nonlabored ventilation, respiratory function stable and patient connected to nasal cannula oxygen Cardiovascular status: blood pressure returned to baseline and stable Postop Assessment: no apparent nausea or vomiting Anesthetic complications: no    Last Vitals:  Vitals:   11/18/17 1355 11/18/17 1557  BP: 127/68 120/69  Pulse: 97 87  Resp: 16 16  Temp: 36.7 C 36.5 C  SpO2: 93% 92%    Last Pain:  Vitals:   11/18/17 1557  TempSrc: Oral  PainSc:                  [place] S

## 2017-11-18 NOTE — ED Provider Notes (Signed)
[facility] DEPT Provider Note   CSN: [id number] Arrival date & time: 11/18/17  0449     History   Chief Complaint Chief Complaint  Patient presents with  . Fall    HPI Holly Cochran is a 67 y.o. female.  HPI  Patient is a 67 year old female with history of common peroneal neuropathy of the left lower extremity, GERD, hyperlipidemia, osteopenia, who presents the emergency department today for evaluation after she had a mechanical fall.  Patient states that she try to get out of her bed prior to arrival and rolled off onto her left side hitting her head.  States that she did not lose consciousness.  Does have pain to the left side of her head.  She is not on blood thinners.  She denies neck or back pain.  She is complaining of left hip pain.  Pain is constant and severe in nature.  Is worse with movement of the left lower extremity.  Past Medical History:  Diagnosis Date  . Common peroneal neuropathy of left lower extremity 05/20/2014  . FASCIITIS, PLANTAR 10/14/2007   Qualifier: Diagnosis of  By: Linna Darner MD, Gwyndolyn Saxon    . GERD (gastroesophageal reflux disease)   . Hyperlipidemia   . Osteopenia 2015   T score -1.1  . Thyroid disease    hyperthyroid-RA I    Patient Active Problem List   Diagnosis Date Noted  . Intertrochanteric fracture of left femur ([place]) 11/18/2017  . Adjustment disorder with depressed mood 02/05/2017  . Vitamin D deficiency 08/31/2016  . Family history of diabetes mellitus in mother 01/30/2016  . Anxiety 03/02/2015  . Chronically dry eyes 07/20/2014  . Common peroneal neuropathy of left lower extremity 05/20/2014  . GERD (gastroesophageal reflux disease) 02/08/2011  . RHINITIS 11/24/2008  . GANGLION OF TENDON SHEATH 11/10/2008  . Hyperlipidemia 08/27/2007  . Osteopenia 08/27/2007  . Hypothyroidism, postradioiodine therapy 06/01/2006    Past Surgical History:  Procedure Laterality Date  . COLONOSCOPY  2005    Dr  Sharlett Iles, negative  . INTRAMEDULLARY (IM) NAIL INTERTROCHANTERIC Left 11/18/2017   Procedure: INTRAMEDULLARY (IM) NAIL INTERTROCHANTRIC;  Surgeon: Dorna Leitz, MD;  Location: [facility];  Service: Orthopedics;  Laterality: Left;  . PLANTAR FASCIA SURGERY  08/2010  . TONSILLECTOMY       OB History    Gravida  0   Para      Term      Preterm      AB      Living        SAB      TAB      Ectopic      Multiple      Live Births               Home Medications    Prior to Admission medications   Medication Sig Start Date End Date Taking? Authorizing Provider  aspirin 81 MG tablet Take 81 mg by mouth daily.     Yes [provider]  Cholecalciferol (VITAMIN D3) 25 MCG (1000 UT) CAPS Take 1,000 Units by mouth daily.   Yes [provider]  escitalopram (LEXAPRO) 20 MG tablet Take 1 tablet (20 mg total) by mouth daily. 02/05/17  Yes Burns, Claudina Lick, MD  fish oil-omega-3 fatty acids 1000 MG capsule Take 2 g by mouth daily.     Yes [provider]  fluticasone (FLONASE) 50 MCG/ACT nasal spray USE TWO SPRAY IN EACH NOSTRIL EVERY DAY 12/03/13  Yes  Hendricks Limes, MD  ibuprofen (ADVIL,MOTRIN) 200 MG tablet Take 400 mg by mouth every 6 (six) hours as needed for mild pain.   Yes [provider]  levothyroxine (SYNTHROID, LEVOTHROID) 125 MCG tablet TAKE 1 TABLET BY MOUTH ONCE DAILY EXCEPT  TAKE  1/2  TABLET  ON  TUESDAY  ,THURSDAY  AND  SATURDAY 06/01/17  Yes Burns, Claudina Lick, MD  pravastatin (PRAVACHOL) 40 MG tablet Take 1 tablet (40 mg total) by mouth at bedtime. 02/05/17  Yes Burns, Claudina Lick, MD  ranitidine (ZANTAC) 150 MG tablet Take 1 tablet (150 mg total) by mouth 2 (two) times daily. 02/05/17  Yes Burns, Claudina Lick, MD  aspirin EC 325 MG tablet Take 1 tablet (325 mg total) by mouth 2 (two) times daily after a meal. Take x 1 month post op to decrease risk of blood clots. 11/18/17   Gary Fleet, PA-C  buPROPion (WELLBUTRIN XL) 150 MG 24 hr tablet TAKE 1  TABLET BY MOUTH ONCE DAILY Patient not taking: Reported on 11/18/2017 05/01/17   Binnie Rail, MD  levothyroxine (SYNTHROID, LEVOTHROID) 125 MCG tablet 1 qd three days a week and 1/2 four days a week Patient not taking: Reported on 11/18/2017 02/05/17   Binnie Rail, MD  oxyCODONE-acetaminophen (PERCOCET/ROXICET) 5-325 MG tablet Take 1 tablet by mouth every 6 (six) hours as needed for severe pain. 11/18/17   Gary Fleet, PA-C  pseudoephedrine (SUDAFED 12 HOUR) 120 MG 12 hr tablet Take 1 tablet (120 mg total) by mouth 2 (two) times daily. Patient not taking: Reported on 11/18/2017 03/03/16   Leonie Douglas, PA-C    Family History Family History  Problem Relation Age of Onset  . Diabetes Mother   . Kidney disease Mother   . Hypertension Mother   . Cancer Father        Pancreatic  . Breast cancer Paternal Aunt        Age 68's  . Cancer Maternal Uncle        melanoma  . Diabetes Maternal Uncle   . Colon cancer Neg Hx   . Esophageal cancer Neg Hx   . Stomach cancer Neg Hx   . Rectal cancer Neg Hx     Social History Social History   Tobacco Use  . Smoking status: Never Smoker  . Smokeless tobacco: Never Used  Substance Use Topics  . Alcohol use: Yes    Alcohol/week: 1.0 standard drinks    Types: 1 Standard drinks or equivalent per week    Comment: occasionally  . Drug use: No     Allergies   Sulfa antibiotics; Sulfasalazine; Codone [hydrocodone]; Sulfacetamide sodium; and Prednisone   Review of Systems Review of Systems  Constitutional: Negative for chills and fever.  HENT: Negative for dental problem.        Pain to left side of face  Eyes: Negative for visual disturbance.  Respiratory: Negative for shortness of breath.   Cardiovascular: Negative for chest pain.  Gastrointestinal: Negative for abdominal pain, nausea and vomiting.  Genitourinary: Negative for flank pain.  Musculoskeletal: Negative for back pain and neck pain.       Left hip pain  Skin:  Negative for rash.  Neurological: Positive for headaches.       Head trauma, no LOC    Physical Exam Updated Vital Signs BP 130/77 (BP Location: Right Arm)   Pulse (!) 106   Temp 98.6 F (37 C) (Oral)   Resp 18   Ht 5'  5" (1.651 m)   Wt 80.3 kg   LMP 01/09/2005   SpO2 93%   BMI 29.45 kg/m   Physical Exam  Constitutional: She appears well-developed and well-nourished. No distress.  HENT:  Head: Normocephalic.  Hematoma and TTP to the left zygomatic bone.  Eyes: Conjunctivae are normal.  Neck: Neck supple.  Cardiovascular: Normal rate, regular rhythm and normal heart sounds.  No murmur heard. Pulmonary/Chest: Effort normal and breath sounds normal. No respiratory distress.  Abdominal: Soft. Bowel sounds are normal. There is no tenderness.  Musculoskeletal:  No TTP to the cervical, thoracic, or lumbar spine. TTP to the left hip and proximal femur. Distal pulses intact. Sensation intact.  Neurological: She is alert.  Oriented. Moving extremities normally with exception to LLE (secondary to pain). Answering questions appropriately. Following commands appropriately  Skin: Skin is warm and dry.  Psychiatric: She has a normal mood and affect.  Nursing note and vitals reviewed.  ED Treatments / Results  Labs (all labs ordered are listed, but only abnormal results are displayed) Labs Reviewed  BASIC METABOLIC PANEL - Abnormal; Notable for the following components:      Result Value   Glucose, Bld 117 (*)    All other components within normal limits  BASIC METABOLIC PANEL - Abnormal; Notable for the following components:   Sodium 134 (*)    Glucose, Bld 134 (*)    Calcium 8.5 (*)    All other components within normal limits  CBC - Abnormal; Notable for the following components:   WBC 14.1 (*)    RBC 3.53 (*)    Hemoglobin 11.1 (*)    HCT 34.0 (*)    All other components within normal limits  CBC - Abnormal; Notable for the following components:   WBC 12.0 (*)    RBC  3.44 (*)    Hemoglobin 10.9 (*)    HCT 33.7 (*)    All other components within normal limits  CBC WITH DIFFERENTIAL/PLATELET  HIV ANTIBODY (ROUTINE TESTING W REFLEX)  URINALYSIS, ROUTINE W REFLEX MICROSCOPIC    EKG None  Radiology Dg Chest Port 1 View  Result Date: 11/19/2017 CLINICAL DATA:  Elevated white blood cell count EXAM: PORTABLE CHEST 1 VIEW COMPARISON:  None. FINDINGS: The lungs are borderline hypoinflated. There is linear increased density in the right perihilar region. The left lung is clear. The heart and pulmonary vascularity are normal. The mediastinum is normal in width. The bony thorax exhibits no acute abnormality. IMPRESSION: Probable subsegmental atelectasis in the right perihilar region. No alveolar pneumonia, CHF, nor other acute cardiopulmonary abnormality. Electronically Signed   By: David  Martinique M.D.   On: 11/19/2017 10:49   Dg C-arm 1-60 Min-no Report  Result Date: 11/18/2017 Fluoroscopy was utilized by the requesting physician.  No radiographic interpretation.   Dg Hip Operative Unilat W Or W/o Pelvis Left  Result Date: 11/18/2017 CLINICAL DATA:  Intramedullary nail EXAM: OPERATIVE left HIP (WITH PELVIS IF PERFORMED) for VIEWS TECHNIQUE: Fluoroscopic spot image(s) were submitted for interpretation post-operatively. COMPARISON:  Earlier same day FINDINGS: Four images show intramedullary nail and compression screw reduction of an intertrochanteric fracture, with 2 distal locking screws. Alignment appears near anatomic. IMPRESSION: Good appearance following ORIF for intertrochanteric fracture. Electronically Signed   By: Nelson Chimes M.D.   On: 11/18/2017 13:33    Procedures Procedures (including critical care time)  Medications Ordered in ED Medications  morphine 2 MG/ML injection 2 mg (2 mg Intravenous Given 11/18/17 1247)  famotidine (PEPCID)  tablet 20 mg (20 mg Oral Given 11/19/17 0946)  levothyroxine (SYNTHROID, LEVOTHROID) tablet 125 mcg (125 mcg  Oral Given 11/20/17 0513)  escitalopram (LEXAPRO) tablet 20 mg (20 mg Oral Given 11/19/17 0947)  pravastatin (PRAVACHOL) tablet 40 mg (40 mg Oral Given 11/19/17 2119)  docusate sodium (COLACE) capsule 100 mg (100 mg Oral Given 11/19/17 2119)  ondansetron (ZOFRAN) tablet 4 mg (has no administration in time range)    Or  ondansetron (ZOFRAN) injection 4 mg (has no administration in time range)  alum & mag hydroxide-simeth (MAALOX/MYLANTA) 200-200-20 MG/5ML suspension 30 mL (has no administration in time range)  aspirin EC tablet 325 mg (325 mg Oral Given 11/19/17 1744)  acetaminophen (TYLENOL) tablet 325-650 mg (650 mg Oral Given 11/19/17 1457)  oxyCODONE (Oxy IR/ROXICODONE) immediate release tablet 5-10 mg (10 mg Oral Given 11/20/17 0513)  traMADol (ULTRAM) tablet 50 mg (50 mg Oral Not Given 11/20/17 0552)  methocarbamol (ROBAXIN) tablet 500 mg (500 mg Oral Given 11/20/17 0512)    Or  methocarbamol (ROBAXIN) 500 mg in dextrose 5 % 50 mL IVPB ( Intravenous See Alternative 11/20/17 0512)  bisacodyl (DULCOLAX) EC tablet 5 mg (has no administration in time range)  magnesium citrate solution 1 Bottle (has no administration in time range)  fentaNYL (SUBLIMAZE) 100 MCG/2ML injection (has no administration in time range)  pneumococcal 23 valent vaccine (PNU-IMMUNE) injection 0.5 mL (has no administration in time range)  0.9 %  sodium chloride infusion ( Intravenous Rate/Dose Verify 11/20/17 0554)  polyethylene glycol (MIRALAX / GLYCOLAX) packet 17 g (17 g Oral Not Given 11/20/17 0553)  Influenza vac split quadrivalent PF (FLUZONE HIGH-DOSE) injection 0.5 mL (has no administration in time range)  fentaNYL (SUBLIMAZE) injection 50 mcg (50 mcg Intramuscular Given 11/18/17 0513)  fentaNYL (SUBLIMAZE) injection 50 mcg (50 mcg Intravenous Given 11/18/17 0525)  HYDROmorphone (DILAUDID) injection 1 mg (1 mg Intravenous Given 11/18/17 0559)  ondansetron (ZOFRAN) injection 4 mg (4 mg Intravenous Given 11/18/17  0559)  fentaNYL (SUBLIMAZE) injection 50 mcg (50 mcg Intravenous Given 11/18/17 0529)  HYDROmorphone (DILAUDID) injection 1 mg (1 mg Intravenous Given 11/18/17 0711)  ceFAZolin (ANCEF) IVPB 2g/100 mL premix (2 g Intravenous Given 11/18/17 0956)  ceFAZolin (ANCEF) 2-4 GM/100ML-% IVPB (  Override pull for Anesthesia 11/18/17 0956)  ceFAZolin (ANCEF) IVPB 2g/100 mL premix (2 g Intravenous New Bag/Given 11/18/17 2204)  acetaminophen (TYLENOL) tablet 1,000 mg (1,000 mg Oral Given 11/19/17 0607)     Initial Impression / Assessment and Plan / ED Course  I have reviewed the triage vital signs and the nursing notes.  Pertinent labs & imaging results that were available during my care of the patient were reviewed by me and considered in my medical decision making (see chart for details).  Pt presenting after mechanical fall with head trauma, no loc. No blood thinners. No midline tenderness. C/o left hip and proximal femur pain. Xray left femur with Irregular lucency extending through the intertrochanteric region of the left femur, suspicious for acute nondisplaced fracture. Cannot clear c-spine secondary to distracting injury therefor ct cervical spine obtained and did not show any acute changes. Ct head and maxillofacial obtained with no acute intracranial abnormality and no acute bony abnormalities to the face.  Patient's vital stable, screening labs ordered in anticipation of orthopedic surgery.   6:47 AM Consult with Dr. Berenice Primas with orthopedics who states to keep the patient NPO and have her admitted to medicine for surgery later today.  7:00 AM CONSULT With Dr. Tawanna Solo who accepts pt  for admission   Final Clinical Impressions(s) / ED Diagnoses   Final diagnoses:  Surgery, elective  Intertrochanteric fracture of left femur ([place])  Leukocytosis    ED Discharge Orders         Ordered    aspirin EC 325 MG tablet  2 times daily after meals     11/18/17 1128    oxyCODONE-acetaminophen  (PERCOCET/ROXICET) 5-325 MG tablet  Every 6 hours PRN     11/18/17 1128    Weight bearing as tolerated     11/18/17 1128           Latavion Halls, [place], PA-C 11/20/17 [place], Dan, DO 11/20/17 1502

## 2017-11-18 NOTE — Anesthesia Preprocedure Evaluation (Signed)
Anesthesia Evaluation  Patient identified by MRN, date of birth, ID band Patient awake    Reviewed: Allergy & Precautions, H&P , NPO status , Patient's Chart, lab work & pertinent test results  Airway Mallampati: II   Neck ROM: full    Dental   Pulmonary neg pulmonary ROS,    breath sounds clear to auscultation       Cardiovascular negative cardio ROS   Rhythm:regular Rate:Normal     Neuro/Psych PSYCHIATRIC DISORDERS Anxiety  Neuromuscular disease    GI/Hepatic GERD  ,  Endo/Other  Hypothyroidism   Renal/GU      Musculoskeletal   Abdominal   Peds  Hematology   Anesthesia Other Findings   Reproductive/Obstetrics                             Anesthesia Physical Anesthesia Plan  ASA: II  Anesthesia Plan: General   Post-op Pain Management:    Induction: Intravenous  PONV Risk Score and Plan: 3 and Ondansetron, Dexamethasone, Treatment may vary due to age or medical condition and Midazolam  Airway Management Planned: Oral ETT  Additional Equipment:   Intra-op Plan:   Post-operative Plan: Extubation in OR  Informed Consent: I have reviewed the patients History and Physical, chart, labs and discussed the procedure including the risks, benefits and alternatives for the proposed anesthesia with the patient or authorized representative who has indicated his/her understanding and acceptance.     Plan Discussed with: CRNA, Anesthesiologist and Surgeon  Anesthesia Plan Comments:         Anesthesia Quick Evaluation

## 2017-11-18 NOTE — ED Triage Notes (Signed)
Patient brought in by [facility]. Patient fell getting out of bed. Patient slipped on a rug. Patient did not loose consciousness. Patient has pain on left hip. 8/10 pain. Hematoma on left cheek bone.

## 2017-11-18 NOTE — Brief Op Note (Signed)
11/18/2017  11:03 AM  PATIENT:  Holly Cochran  67 y.o. female  PRE-OPERATIVE DIAGNOSIS:  left hip fracture  POST-OPERATIVE DIAGNOSIS:  left hip fracture  PROCEDURE:  Procedure(s): INTRAMEDULLARY (IM) NAIL INTERTROCHANTRIC (Left)  SURGEON:  Surgeon(s) and Role:    Dorna Leitz, MD - Primary  PHYSICIAN ASSISTANT:   ASSISTANTS: jim bethune   ANESTHESIA:   general  EBL:  150 mL   BLOOD ADMINISTERED:none  DRAINS: none   LOCAL MEDICATIONS USED:  MARCAINE     SPECIMEN:  No Specimen  DISPOSITION OF SPECIMEN:  N/A  COUNTS:  YES  TOURNIQUET:  * No tourniquets in log *  DICTATION: .Other Dictation: Dictation Number [id number]  PLAN OF CARE: Admit to inpatient   PATIENT DISPOSITION:  PACU - hemodynamically stable.   Delay start of Pharmacological VTE agent (>24hrs) due to surgical blood loss or risk of bleeding: no

## 2017-11-18 NOTE — Anesthesia Procedure Notes (Signed)
Procedure Name: Intubation Date/Time: 11/18/2017 9:54 AM Performed by: Jaisean Monteforte D, CRNA Pre-anesthesia Checklist: Patient identified, Emergency Drugs available, Suction available and Patient being monitored Patient Re-evaluated:Patient Re-evaluated prior to induction Oxygen Delivery Method: Circle system utilized Preoxygenation: Pre-oxygenation with 100% oxygen Induction Type: IV induction Ventilation: Mask ventilation without difficulty Laryngoscope Size: Mac and 3 Grade View: Grade I Tube type: Oral Tube size: 7.5 mm Number of attempts: 1 Airway Equipment and Method: Stylet Placement Confirmation: ETT inserted through vocal cords under direct vision,  positive ETCO2 and breath sounds checked- equal and bilateral Secured at: 21 cm Tube secured with: Tape Dental Injury: Teeth and Oropharynx as per pre-operative assessment

## 2017-11-18 NOTE — H&P (Signed)
History and Physical    Holly Cochran:[id number] DOB: 1950-12-13 DOA: 11/18/2017  PCP: Binnie Rail, MD   Patient coming from: Home  Chief Complaint: Pain on the left hip.  HPI: Holly Cochran is a 67 y.o. female with medical history significant of hyperlipidemia, hypothyroidism, anxiety/depression, GERD who presents to the emergency department from home today with complaints of severe pain on the left hip.  Patient was trying to get out of her bed but unfortunately she rolled off on her left side and fell.  She hit her left hip and left side of the head.  Immediately she developed severe pain on the left hip.  She also had some bruises on the left side of the face.  She did not lose any consciousness.  Complained of pain on the left side of her head and face.  Denies any neck or back pain. Imagings done in the emergency department showed acute nondisplaced left-sided intertrochanteric fracture of femur. Patient seen and examined the bedside in the emergency department.  She was found to be hemodynamically stable.  She was complaining of constant pain and muscle spasms on the left hip.  She denies any chest pain, shortness of breath, palpitations, abdominal pain, dysuria, nausea, vomiting, fever, chills, headache, blurry vision.  ED Course: X-ray confirmed acute nondisplaced intertrochanteric fracture of left femur.  Orthopedics consulted.  Planning follow-up today.   Past Medical History:  Diagnosis Date  . Common peroneal neuropathy of left lower extremity 05/20/2014  . FASCIITIS, PLANTAR 10/14/2007   Qualifier: Diagnosis of  By: Linna Darner MD, Gwyndolyn Saxon    . GERD (gastroesophageal reflux disease)   . Hyperlipidemia   . Osteopenia 2015   T score -1.1  . Thyroid disease    hyperthyroid-RA I    Past Surgical History:  Procedure Laterality Date  . COLONOSCOPY  2005    Dr Sharlett Iles, negative  . PLANTAR FASCIA SURGERY  08/2010  . TONSILLECTOMY       reports that she has never  smoked. She has never used smokeless tobacco. She reports that she drinks about 1.0 standard drinks of alcohol per week. She reports that she does not use drugs.  Allergies  Allergen Reactions  . Sulfa Antibiotics     Rash Because of a history of documented adverse serious drug reaction;Medi Alert bracelet  is recommended  . Sulfasalazine Anaphylaxis    Rash Because of a history of documented adverse serious drug reaction;Medi Alert braceletis recommended  . Sulfacetamide Sodium     Rash Because of a history of documented adverse serious drug reaction;Medi Alert braceletis recommended  . Prednisone Rash    REACTION: FUNGAL INFECTION-MOUTH (?) REACTION: FUNGAL INFECTION-MOUTH (?)    Family History  Problem Relation Age of Onset  . Diabetes Mother   . Kidney disease Mother   . Hypertension Mother   . Cancer Father        Pancreatic  . Breast cancer Paternal Aunt        Age 78's  . Cancer Maternal Uncle        melanoma  . Diabetes Maternal Uncle   . Colon cancer Neg Hx   . Esophageal cancer Neg Hx   . Stomach cancer Neg Hx   . Rectal cancer Neg Hx      Prior to Admission medications   Medication Sig Start Date End Date Taking? Authorizing Provider  aspirin 81 MG tablet Take 81 mg by mouth daily.      [provider]  buPROPion (WELLBUTRIN XL) 150 MG 24 hr tablet TAKE 1 TABLET BY MOUTH ONCE DAILY 05/01/17   Binnie Rail, MD  escitalopram (LEXAPRO) 20 MG tablet Take 1 tablet (20 mg total) by mouth daily. 02/05/17   Binnie Rail, MD  fish oil-omega-3 fatty acids 1000 MG capsule Take 2 g by mouth daily.      [provider]  fluticasone Asencion Islam) 50 MCG/ACT nasal spray USE TWO SPRAY IN EACH NOSTRIL EVERY DAY 12/03/13   Hendricks Limes, MD  levothyroxine (SYNTHROID, LEVOTHROID) 125 MCG tablet 1 qd three days a week and 1/2 four days a week 02/05/17   Binnie Rail, MD  levothyroxine (SYNTHROID, LEVOTHROID) 125 MCG tablet TAKE 1 TABLET BY MOUTH ONCE DAILY  EXCEPT  TAKE  1/2  TABLET  ON  TUESDAY  ,THURSDAY  AND  SATURDAY 06/01/17   Binnie Rail, MD  pravastatin (PRAVACHOL) 40 MG tablet Take 1 tablet (40 mg total) by mouth at bedtime. 02/05/17   Binnie Rail, MD  pseudoephedrine (SUDAFED 12 HOUR) 120 MG 12 hr tablet Take 1 tablet (120 mg total) by mouth 2 (two) times daily. 03/03/16   Tenna Delaine D, PA-C  ranitidine (ZANTAC) 150 MG tablet Take 1 tablet (150 mg total) by mouth 2 (two) times daily. 02/05/17   Binnie Rail, MD    Physical Exam: Vitals:   11/18/17 0558 11/18/17 0600 11/18/17 0601 11/18/17 0711  BP:  (!) 128/106  (!) 146/81  Pulse: 84 81 85 90  Resp: 13 12 10 12   Temp:      TempSrc:      SpO2: 99% 99% 99% 95%  Weight:      Height:        Constitutional: In moderate distress due to pain Vitals:   11/18/17 0558 11/18/17 0600 11/18/17 0601 11/18/17 0711  BP:  (!) 128/106  (!) 146/81  Pulse: 84 81 85 90  Resp: 13 12 10 12   Temp:      TempSrc:      SpO2: 99% 99% 99% 95%  Weight:      Height:       Eyes: PERRL, lids and conjunctivae normal ENMT: Mucous membranes are moist. Posterior pharynx clear of any exudate or lesions.Normal dentition.  Neck: normal, supple, no masses, no thyromegaly Respiratory: clear to auscultation bilaterally, no wheezing, no crackles. Normal respiratory effort. No accessory muscle use.  Cardiovascular: Regular rate and rhythm, no murmurs / rubs / gallops. No extremity edema. 2+ pedal pulses. No carotid bruits.  Abdomen: no tenderness, no masses palpated. No hepatosplenomegaly. Bowel sounds positive.  Musculoskeletal: no clubbing / cyanosis.  Tenderness on the left hip with local edema.  Decreased range of motion of left lower extremity Skin: no rashes, lesions, ulcers. No induration Neurologic: CN 2-12 grossly intact. Sensation intact, DTR normal. Strength 5/5 in all 4.  Psychiatric: Normal judgment and insight. Alert and oriented x 3. Normal mood.   Foley Catheter:None  Labs on  Admission: I have personally reviewed following labs and imaging studies  CBC: Recent Labs  Lab 11/18/17 0605  WBC 8.4  NEUTROABS 5.2  HGB 14.9  HCT 44.8  MCV 96.8  PLT 810   Basic Metabolic Panel: Recent Labs  Lab 11/18/17 0605  NA 136  K 3.7  CL 104  CO2 22  GLUCOSE 117*  BUN 14  CREATININE 0.62  CALCIUM 8.9   GFR: Estimated Creatinine Clearance: 72.4 mL/min (by C-G formula based on SCr of 0.62 mg/dL).  Liver Function Tests: No results for input(s): AST, ALT, ALKPHOS, BILITOT, PROT, ALBUMIN in the last 168 hours. No results for input(s): LIPASE, AMYLASE in the last 168 hours. No results for input(s): AMMONIA in the last 168 hours. Coagulation Profile: No results for input(s): INR, PROTIME in the last 168 hours. Cardiac Enzymes: No results for input(s): CKTOTAL, CKMB, CKMBINDEX, TROPONINI in the last 168 hours. BNP (last 3 results) No results for input(s): PROBNP in the last 8760 hours. HbA1C: No results for input(s): HGBA1C in the last 72 hours. CBG: No results for input(s): GLUCAP in the last 168 hours. Lipid Profile: No results for input(s): CHOL, HDL, LDLCALC, TRIG, CHOLHDL, LDLDIRECT in the last 72 hours. Thyroid Function Tests: No results for input(s): TSH, T4TOTAL, FREET4, T3FREE, THYROIDAB in the last 72 hours. Anemia Panel: No results for input(s): VITAMINB12, FOLATE, FERRITIN, TIBC, IRON, RETICCTPCT in the last 72 hours. Urine analysis:    Component Value Date/Time   COLORURINE YELLOW 06/25/2014 1459   APPEARANCEUR CLEAR 06/25/2014 1459   LABSPEC 1.010 06/25/2014 1459   PHURINE 7.0 06/25/2014 1459   GLUCOSEU NEGATIVE 06/25/2014 1459   HGBUR NEGATIVE 06/25/2014 1459   BILIRUBINUR NEGATIVE 06/25/2014 1459   BILIRUBINUR neg 06/25/2014 1456   KETONESUR NEGATIVE 06/25/2014 1459   PROTEINUR neg 06/25/2014 1456   PROTEINUR NEG 12/30/2013 1520   UROBILINOGEN 1.0 06/25/2014 1459   NITRITE NEGATIVE 06/25/2014 1459   LEUKOCYTESUR NEGATIVE 06/25/2014 1459     Radiological Exams on Admission: Ct Head Wo Contrast  Result Date: 11/18/2017 CLINICAL DATA:  Initial evaluation for acute trauma, fall. EXAM: CT HEAD WITHOUT CONTRAST CT MAXILLOFACIAL WITHOUT CONTRAST CT CERVICAL SPINE WITHOUT CONTRAST TECHNIQUE: Multidetector CT imaging of the head, cervical spine, and maxillofacial structures were performed using the standard protocol without intravenous contrast. Multiplanar CT image reconstructions of the cervical spine and maxillofacial structures were also generated. COMPARISON:  None available. FINDINGS: CT HEAD FINDINGS Brain: Generalized age-related cerebral atrophy with mild chronic small vessel ischemic disease. No acute intracranial hemorrhage. No acute large vessel territory infarct. No mass lesion, midline shift or mass effect. No hydrocephalus. No extra-axial fluid collection. Vascular: No hyperdense vessel. Scattered vascular calcifications noted within the carotid siphons. Skull: Scalp soft tissues and calvarium within normal limits. Other: Mastoid air cells are clear. CT MAXILLOFACIAL FINDINGS Osseous: Zygomatic arches intact. No acute maxillary fracture. Pterygoid plates intact. Nasal bones intact. Nasal septum relatively midline and intact. Visualized mandible intact. Mandibular condyles normally situated. No acute abnormality about the dentition. Orbits: Globes and orbital soft tissues within normal limits. Bony orbits intact. Sinuses: Maxillary sinuses nearly completely opacified, chronic in appearance. Paranasal sinuses are otherwise clear. Soft tissues: Soft tissue contusion/hematoma present at the left face. CT CERVICAL SPINE FINDINGS Alignment: Straightening of the normal cervical lordosis. No listhesis. Skull base and vertebrae: Skull base intact. Normal C1-2 articulations are preserved in the dens is intact. Vertebral body heights maintained. No acute fracture. Soft tissues and spinal canal: Paraspinous soft tissues within normal limits. No  abnormal prevertebral edema. Disc levels: Mild to moderate cervical spondylolysis at C4-5 through C6-7. Upper chest: Visualized upper chest demonstrates no acute finding. Partially visualized lung apices are grossly clear. Other: None. IMPRESSION: 1. No acute intracranial abnormality. 2. Mild age-related cerebral atrophy with chronic small vessel ischemic disease. 3. Soft tissue contusion/hematoma at the left face. 4. No acute maxillofacial fracture. 5. Chronic maxillary sinusitis. 6. No acute traumatic injury within the cervical spine. 7. Mild to moderate cervical spondylolysis at C4-5through C6-7. Electronically Signed  By: Jeannine Boga M.D.   On: 11/18/2017 06:52   Ct Cervical Spine Wo Contrast  Result Date: 11/18/2017 CLINICAL DATA:  Initial evaluation for acute trauma, fall. EXAM: CT HEAD WITHOUT CONTRAST CT MAXILLOFACIAL WITHOUT CONTRAST CT CERVICAL SPINE WITHOUT CONTRAST TECHNIQUE: Multidetector CT imaging of the head, cervical spine, and maxillofacial structures were performed using the standard protocol without intravenous contrast. Multiplanar CT image reconstructions of the cervical spine and maxillofacial structures were also generated. COMPARISON:  None available. FINDINGS: CT HEAD FINDINGS Brain: Generalized age-related cerebral atrophy with mild chronic small vessel ischemic disease. No acute intracranial hemorrhage. No acute large vessel territory infarct. No mass lesion, midline shift or mass effect. No hydrocephalus. No extra-axial fluid collection. Vascular: No hyperdense vessel. Scattered vascular calcifications noted within the carotid siphons. Skull: Scalp soft tissues and calvarium within normal limits. Other: Mastoid air cells are clear. CT MAXILLOFACIAL FINDINGS Osseous: Zygomatic arches intact. No acute maxillary fracture. Pterygoid plates intact. Nasal bones intact. Nasal septum relatively midline and intact. Visualized mandible intact. Mandibular condyles normally situated.  No acute abnormality about the dentition. Orbits: Globes and orbital soft tissues within normal limits. Bony orbits intact. Sinuses: Maxillary sinuses nearly completely opacified, chronic in appearance. Paranasal sinuses are otherwise clear. Soft tissues: Soft tissue contusion/hematoma present at the left face. CT CERVICAL SPINE FINDINGS Alignment: Straightening of the normal cervical lordosis. No listhesis. Skull base and vertebrae: Skull base intact. Normal C1-2 articulations are preserved in the dens is intact. Vertebral body heights maintained. No acute fracture. Soft tissues and spinal canal: Paraspinous soft tissues within normal limits. No abnormal prevertebral edema. Disc levels: Mild to moderate cervical spondylolysis at C4-5 through C6-7. Upper chest: Visualized upper chest demonstrates no acute finding. Partially visualized lung apices are grossly clear. Other: None. IMPRESSION: 1. No acute intracranial abnormality. 2. Mild age-related cerebral atrophy with chronic small vessel ischemic disease. 3. Soft tissue contusion/hematoma at the left face. 4. No acute maxillofacial fracture. 5. Chronic maxillary sinusitis. 6. No acute traumatic injury within the cervical spine. 7. Mild to moderate cervical spondylolysis at C4-5through C6-7. Electronically Signed   By: Jeannine Boga M.D.   On: 11/18/2017 06:52   Dg Pelvis Portable  Result Date: 11/18/2017 CLINICAL DATA:  Initial evaluation for acute trauma, fall, left hip pain. EXAM: PORTABLE PELVIS 1-2 VIEWS COMPARISON:  None. FINDINGS: There is irregular lucency extending through the intertrochanteric region of the left hip, suspicious for possible acute nondisplaced fracture. Possible subtle cortical step-off at the lesser trochanter. Visualized femur otherwise intact. Visualized pelvis intact. No acute soft tissue abnormality. IMPRESSION: Irregular lucency extending through the intertrochanteric region of the left femur, suspicious for acute  nondisplaced fracture. Correlation with physical exam recommended. If clinical picture is equivocal for acute fracture, further assessment with dedicated cross-sectional imaging may be helpful for further evaluation. Electronically Signed   By: Jeannine Boga M.D.   On: 11/18/2017 06:30   Dg Femur Portable 1 View Left  Result Date: 11/18/2017 CLINICAL DATA:  Initial evaluation for acute trauma, fall. EXAM: LEFT FEMUR PORTABLE 1 VIEW COMPARISON:  None. FINDINGS: Proximal femur and hip not well assessed on this examination. Femur otherwise intact with no other acute fracture or dislocation. Degenerative changes noted about the knee. No acute soft tissue abnormality. IMPRESSION: 1. No acute osseous abnormality about the left femur. 2. Please note that the proximal femur and hip not well assessed on this examination, better seen on concomitant radiograph of the pelvis. Electronically Signed   By: [name].D.  On: 11/18/2017 06:28   Ct Maxillofacial Wo Contrast  Result Date: 11/18/2017 CLINICAL DATA:  Initial evaluation for acute trauma, fall. EXAM: CT HEAD WITHOUT CONTRAST CT MAXILLOFACIAL WITHOUT CONTRAST CT CERVICAL SPINE WITHOUT CONTRAST TECHNIQUE: Multidetector CT imaging of the head, cervical spine, and maxillofacial structures were performed using the standard protocol without intravenous contrast. Multiplanar CT image reconstructions of the cervical spine and maxillofacial structures were also generated. COMPARISON:  None available. FINDINGS: CT HEAD FINDINGS Brain: Generalized age-related cerebral atrophy with mild chronic small vessel ischemic disease. No acute intracranial hemorrhage. No acute large vessel territory infarct. No mass lesion, midline shift or mass effect. No hydrocephalus. No extra-axial fluid collection. Vascular: No hyperdense vessel. Scattered vascular calcifications noted within the carotid siphons. Skull: Scalp soft tissues and calvarium within normal limits.  Other: Mastoid air cells are clear. CT MAXILLOFACIAL FINDINGS Osseous: Zygomatic arches intact. No acute maxillary fracture. Pterygoid plates intact. Nasal bones intact. Nasal septum relatively midline and intact. Visualized mandible intact. Mandibular condyles normally situated. No acute abnormality about the dentition. Orbits: Globes and orbital soft tissues within normal limits. Bony orbits intact. Sinuses: Maxillary sinuses nearly completely opacified, chronic in appearance. Paranasal sinuses are otherwise clear. Soft tissues: Soft tissue contusion/hematoma present at the left face. CT CERVICAL SPINE FINDINGS Alignment: Straightening of the normal cervical lordosis. No listhesis. Skull base and vertebrae: Skull base intact. Normal C1-2 articulations are preserved in the dens is intact. Vertebral body heights maintained. No acute fracture. Soft tissues and spinal canal: Paraspinous soft tissues within normal limits. No abnormal prevertebral edema. Disc levels: Mild to moderate cervical spondylolysis at C4-5 through C6-7. Upper chest: Visualized upper chest demonstrates no acute finding. Partially visualized lung apices are grossly clear. Other: None. IMPRESSION: 1. No acute intracranial abnormality. 2. Mild age-related cerebral atrophy with chronic small vessel ischemic disease. 3. Soft tissue contusion/hematoma at the left face. 4. No acute maxillofacial fracture. 5. Chronic maxillary sinusitis. 6. No acute traumatic injury within the cervical spine. 7. Mild to moderate cervical spondylolysis at C4-5through C6-7. Electronically Signed   By: Jeannine Boga M.D.   On: 11/18/2017 06:52     Assessment/Plan Principal Problem:   Intertrochanteric fracture of left femur (HCC) Active Problems:   Hypothyroidism, postradioiodine therapy   Hyperlipidemia   Osteopenia   GERD (gastroesophageal reflux disease)   Adjustment disorder with depressed mood  Left intertrochanteric fracture of femur: Imagings  described as above.  Orthopedic planning for OR this morning.  Patient has low risks for this procedure. Continue pain management.  Also started on Robaxin for muscle spasms.  PT/OT consultation after the surgery. Pharmacological DVT prophylaxis will be started after the surgery.  Most likely she might need rehab on discharge.  Hypothyroidism: Status post radiation therapy for hyperthyroidism.  Continue Synthyroid.  Hyperlipidemia: Continue statin  GERD: Continue famotidine  Depression/anxiety: On Lexapro.  Severity of Illness: The appropriate patient status for this patient is INPATIENT.   DVT prophylaxis: SCD Code Status: Full Family Communication: Daughter was present at the bedside Consults called: Orthopedics called by ED     Shelly Coss MD [facility] Pager [phone]  If 7PM-7AM, please contact night-coverage [email or web address] Password [email or web address]  11/18/2017, 7:45 AM

## 2017-11-18 NOTE — ED Notes (Signed)
Phone report given to Lovena Le, [place]

## 2017-11-18 NOTE — ED Notes (Signed)
Patient being Transported to OR by Ali Lowe RN time now.

## 2017-11-18 NOTE — Op Note (Signed)
NAME: Holly Cochran, [facility] MEDICAL RECORD AG:[id number] ACCOUNT [id number] DATE OF BIRTH:[birth year] FACILITY: [facility] LOCATION: [id number] PHYSICIAN:Ivon Roedel L. Chaniqua Brisby, MD  OPERATIVE REPORT  DATE OF PROCEDURE:  11/18/2017  PREOPERATIVE DIAGNOSIS:  Intertrochanteric hip fracture, left.  POSTOPERATIVE DIAGNOSIS:  Intertrochanteric hip fracture, left.  PROCEDURE: 1.  Intramedullary rod fixation for intertrochanteric hip fracture, left with a Biomet trochanteric entry nail, 13 mm x 380 mm with a 105 mm locking screw and 2 freehanded distal interlocks. 2.  Interpretation of multiple intraoperative fluoroscopic images.  SURGEON:  Dorna Leitz, MD  ASSISTANT:  [name] PA-C. [name], PA-C was present for the entire case and assisted with retraction, drilling, reaming, and closing to minimize OR time.  BRIEF HISTORY:  The patient is a 67 year old female who fell out of bed early this morning and suffered an intertrochanteric hip fracture on the left side.  She was evaluated in the emergency room, noted to have this fracture, cleared by medicine and  taken to the operating room for intertrochanteric hip fracture, left.  DESCRIPTION OF PROCEDURE:  The patient was taken to the operating room after adequate anesthesia obtained with general anesthetic, the patient was placed supine on the operating table and moved onto the [place] bed.  All bony prominences well padded.  The  left hip underwent a manipulative closed reduction and traction was placed and fluoroscopic images were taken.  A small incision was made just proximal to the hip subcutaneous tissue down to the level of the greater trochanter.  A curved awl was used and  a starting pilot hole was made and drilled.  Following this, the guidewire was advanced down to the knee.  It was measured and we then reamed to a size 14.5, which got [place] in 11/13 mm rod.  We then put a guidewire up the central portion of the head on  both AP and lateral fluoroscopy and  put 105 mm screw in and used a compression technique to compress back down the head.  At that point, the attention was turned to the distal interlocking and we put a freehand distal locking through the oblong spacer.   I really was not happy with the bite there, so I went up and put a screw through the more proximal screw hole and got a really really nice plate there.  So, we were happy with that.  At this point, the wounds were irrigated and suctioned dry.  Final  fluoroscopic images were taken and the patient was then taken to the recovery room and noted to be in satisfactory condition.  Estimated blood loss for procedure was 150 mL.    Of note [name] was present throughout the entire case and assisted during drilling, reaming and closing to minimize OR time.  AN/NUANCE  D:11/18/2017 T:11/18/2017 JOB:[id number]

## 2017-11-19 ENCOUNTER — Inpatient Hospital Stay (HOSPITAL_COMMUNITY): Payer: Medicare Other

## 2017-11-19 ENCOUNTER — Encounter (HOSPITAL_COMMUNITY): Payer: Self-pay | Admitting: Orthopedic Surgery

## 2017-11-19 LAB — BASIC METABOLIC PANEL
Anion gap: 6 (ref 5–15)
BUN: 15 mg/dL (ref 8–23)
CALCIUM: 8.5 mg/dL — AB (ref 8.9–10.3)
CHLORIDE: 101 mmol/L (ref 98–111)
CO2: 27 mmol/L (ref 22–32)
CREATININE: 0.67 mg/dL (ref 0.44–1.00)
GFR calc non Af Amer: >60 mL/min (ref >60)
Glucose, Bld: 134 mg/dL — ABNORMAL HIGH (ref 70–99)
Potassium: 4.7 mmol/L (ref 3.5–5.1)
SODIUM: 134 mmol/L — AB (ref 135–145)

## 2017-11-19 LAB — URINALYSIS, ROUTINE W REFLEX MICROSCOPIC
Bilirubin Urine: NEGATIVE
GLUCOSE, UA: NEGATIVE mg/dL
HGB URINE DIPSTICK: NEGATIVE
KETONES UR: NEGATIVE mg/dL
Leukocytes, UA: NEGATIVE
Nitrite: NEGATIVE
PH: 6 (ref 5.0–8.0)
PROTEIN: NEGATIVE mg/dL
Specific Gravity, Urine: 1.019 (ref 1.005–1.030)

## 2017-11-19 LAB — CBC
HEMATOCRIT: 34 % — AB (ref 36.0–46.0)
Hemoglobin: 11.1 g/dL — ABNORMAL LOW (ref 12.0–15.0)
MCH: 31.4 pg (ref 26.0–34.0)
MCHC: 32.6 g/dL (ref 30.0–36.0)
MCV: 96.3 fL (ref 80.0–100.0)
NRBC: 0 % (ref 0.0–0.2)
PLATELETS: 257 10*3/uL (ref 150–400)
RBC: 3.53 MIL/uL — ABNORMAL LOW (ref 3.87–5.11)
RDW: 11.9 % (ref 11.5–15.5)
WBC: 14.1 10*3/uL — ABNORMAL HIGH (ref 4.0–10.5)

## 2017-11-19 MED ORDER — SODIUM CHLORIDE 0.9 % IV SOLN
INTRAVENOUS | Status: DC
  Administered 2017-11-19 – 2017-11-20 (×2): via INTRAVENOUS

## 2017-11-19 MED ORDER — POLYETHYLENE GLYCOL 3350 17 G PO PACK
17.0000 g | PACK | Freq: Every day | ORAL | Status: DC
  Administered 2017-11-20: 17 g via ORAL
  Filled 2017-11-19: qty 1

## 2017-11-19 NOTE — Progress Notes (Signed)
CSW consult-SNF Plan: D/C home with [facility] and family support. CSW will sign off.   Kathrin Greathouse, Marlinda Mike, MSW Clinical Social Worker  [phone] 11/19/2017  12:21 PM

## 2017-11-19 NOTE — Progress Notes (Signed)
PROGRESS NOTE    Holly Cochran  KNL:[id number] DOB: 28-Feb-1950 DOA: 11/18/2017 PCP: Binnie Rail, MD    Brief Narrative:  Holly Cochran is a 67 y.o. female with medical history significant of hyperlipidemia, hypothyroidism, anxiety/depression, GERD who presents to the emergency department from home today with complaints of severe pain on the left hip.  Patient was trying to get out of her bed but unfortunately she rolled off on her left side and fell.  She hit her left hip and left side of the head.  Immediately she developed severe pain on the left hip.  She also had some bruises on the left side of the face.  She did not lose any consciousness.  Complained of pain on the left side of her head and face.  Denies any neck or back pain. Imagings done in the emergency department showed acute nondisplaced left-sided intertrochanteric fracture of femur. Patient seen and examined the bedside in the emergency department.  She was found to be hemodynamically stable.  She was complaining of constant pain and muscle spasms on the left hip.  She denies any chest pain, shortness of breath, palpitations, abdominal pain, dysuria, nausea, vomiting, fever, chills, headache, blurry vision.  ED Course: X-ray confirmed acute nondisplaced intertrochanteric fracture of left femur.  Orthopedics consulted.  Planning follow-up today.   Assessment & Plan:   Principal Problem:   Intertrochanteric fracture of left femur (HCC) Active Problems:   Hypothyroidism, postradioiodine therapy   Hyperlipidemia   Osteopenia   GERD (gastroesophageal reflux disease)   Adjustment disorder with depressed mood  Left intertrochanteric fracture of femur; Underwent sx 11-10.  PT per Ortho.  DVT prophylaxis per ortho.  .   Hypothyroidism;  Continue with synthroid.   HLD; continue with statins.   Depression; continue with lexapro.   Leukocytosis;  Chest x ray with atelectasis.  Will repeat WBC in am.  UA  negative.   Mild hyponatremia; start IV fluids.   Acute blood loss anemia; expected, post surgery.  Repeat labs in am.     RN Pressure Injury Documentation:    Malnutrition Type:      Malnutrition Characteristics:      Nutrition Interventions:     Estimated body mass index is 29.45 kg/m as calculated from the following:   Height as of this encounter: 5\' 5"  (1.651 m).   Weight as of this encounter: 80.3 kg.   DVT prophylaxis: Aspirin  Code Status: Full code.  Family Communication: care discussed with patient.  Disposition Plan: awaiting PT recommendation, day 1 post sx.   Consultants:   Ortho    Procedures:   sx   Antimicrobials: none   Subjective: Has only cough once today. Pain hip with movement.    Objective: Vitals:   11/18/17 1557 11/18/17 1828 11/18/17 2204 11/19/17 0614  BP: 120/69 119/65 (!) 104/56 (!) 108/52  Pulse: 87 80 75 79  Resp: 16 16 16 16   Temp: 97.7 F (36.5 C) 98.8 F (37.1 C) (!) 97.5 F (36.4 C) 98.1 F (36.7 C)  TempSrc: Oral Oral Axillary Oral  SpO2: 92% 96% 94% 94%  Weight:      Height:        Intake/Output Summary (Last 24 hours) at 11/19/2017 0906 Last data filed at 11/19/2017 0723 Gross per 24 hour  Intake 1867.75 ml  Output 1575 ml  Net 292.75 ml   Filed Weights   11/18/17 0450  Weight: 80.3 kg    Examination:  General exam: Appears  calm and comfortable  Respiratory system: Clear to auscultation. Respiratory effort normal. Cardiovascular system: S1 & S2 heard, RRR. No JVD, murmurs, rubs, gallops or clicks. No pedal edema. Gastrointestinal system: Abdomen is nondistended, soft and nontender. No organomegaly or masses felt. Normal bowel sounds heard. Central nervous system: Alert and oriented. No focal neurological deficits. Extremities: Symmetric 5 x 5 power. Left hip with dressing.  Skin: No rashes, lesions or ulcers Psychiatry: Judgement and insight appear normal. Mood & affect appropriate.      Data Reviewed: I have personally reviewed following labs and imaging studies  CBC: Recent Labs  Lab 11/18/17 0605 11/19/17 0443  WBC 8.4 14.1*  NEUTROABS 5.2  --   HGB 14.9 11.1*  HCT 44.8 34.0*  MCV 96.8 96.3  PLT 291 267   Basic Metabolic Panel: Recent Labs  Lab 11/18/17 0605 11/19/17 0443  NA 136 134*  K 3.7 4.7  CL 104 101  CO2 22 27  GLUCOSE 117* 134*  BUN 14 15  CREATININE 0.62 0.67  CALCIUM 8.9 8.5*   GFR: Estimated Creatinine Clearance: 72.4 mL/min (by C-G formula based on SCr of 0.67 mg/dL). Liver Function Tests: No results for input(s): AST, ALT, ALKPHOS, BILITOT, PROT, ALBUMIN in the last 168 hours. No results for input(s): LIPASE, AMYLASE in the last 168 hours. No results for input(s): AMMONIA in the last 168 hours. Coagulation Profile: No results for input(s): INR, PROTIME in the last 168 hours. Cardiac Enzymes: No results for input(s): CKTOTAL, CKMB, CKMBINDEX, TROPONINI in the last 168 hours. BNP (last 3 results) No results for input(s): PROBNP in the last 8760 hours. HbA1C: No results for input(s): HGBA1C in the last 72 hours. CBG: No results for input(s): GLUCAP in the last 168 hours. Lipid Profile: No results for input(s): CHOL, HDL, LDLCALC, TRIG, CHOLHDL, LDLDIRECT in the last 72 hours. Thyroid Function Tests: No results for input(s): TSH, T4TOTAL, FREET4, T3FREE, THYROIDAB in the last 72 hours. Anemia Panel: No results for input(s): VITAMINB12, FOLATE, FERRITIN, TIBC, IRON, RETICCTPCT in the last 72 hours. Sepsis Labs: No results for input(s): PROCALCITON, LATICACIDVEN in the last 168 hours.  No results found for this or any previous visit (from the past 240 hour(s)).       Radiology Studies: Ct Head Wo Contrast  Result Date: 11/18/2017 CLINICAL DATA:  Initial evaluation for acute trauma, fall. EXAM: CT HEAD WITHOUT CONTRAST CT MAXILLOFACIAL WITHOUT CONTRAST CT CERVICAL SPINE WITHOUT CONTRAST TECHNIQUE: Multidetector CT  imaging of the head, cervical spine, and maxillofacial structures were performed using the standard protocol without intravenous contrast. Multiplanar CT image reconstructions of the cervical spine and maxillofacial structures were also generated. COMPARISON:  None available. FINDINGS: CT HEAD FINDINGS Brain: Generalized age-related cerebral atrophy with mild chronic small vessel ischemic disease. No acute intracranial hemorrhage. No acute large vessel territory infarct. No mass lesion, midline shift or mass effect. No hydrocephalus. No extra-axial fluid collection. Vascular: No hyperdense vessel. Scattered vascular calcifications noted within the carotid siphons. Skull: Scalp soft tissues and calvarium within normal limits. Other: Mastoid air cells are clear. CT MAXILLOFACIAL FINDINGS Osseous: Zygomatic arches intact. No acute maxillary fracture. Pterygoid plates intact. Nasal bones intact. Nasal septum relatively midline and intact. Visualized mandible intact. Mandibular condyles normally situated. No acute abnormality about the dentition. Orbits: Globes and orbital soft tissues within normal limits. Bony orbits intact. Sinuses: Maxillary sinuses nearly completely opacified, chronic in appearance. Paranasal sinuses are otherwise clear. Soft tissues: Soft tissue contusion/hematoma present at the left face. CT CERVICAL SPINE  FINDINGS Alignment: Straightening of the normal cervical lordosis. No listhesis. Skull base and vertebrae: Skull base intact. Normal C1-2 articulations are preserved in the dens is intact. Vertebral body heights maintained. No acute fracture. Soft tissues and spinal canal: Paraspinous soft tissues within normal limits. No abnormal prevertebral edema. Disc levels: Mild to moderate cervical spondylolysis at C4-5 through C6-7. Upper chest: Visualized upper chest demonstrates no acute finding. Partially visualized lung apices are grossly clear. Other: None. IMPRESSION: 1. No acute intracranial  abnormality. 2. Mild age-related cerebral atrophy with chronic small vessel ischemic disease. 3. Soft tissue contusion/hematoma at the left face. 4. No acute maxillofacial fracture. 5. Chronic maxillary sinusitis. 6. No acute traumatic injury within the cervical spine. 7. Mild to moderate cervical spondylolysis at C4-5through C6-7. Electronically Signed   By: Jeannine Boga M.D.   On: 11/18/2017 06:52   Ct Cervical Spine Wo Contrast  Result Date: 11/18/2017 CLINICAL DATA:  Initial evaluation for acute trauma, fall. EXAM: CT HEAD WITHOUT CONTRAST CT MAXILLOFACIAL WITHOUT CONTRAST CT CERVICAL SPINE WITHOUT CONTRAST TECHNIQUE: Multidetector CT imaging of the head, cervical spine, and maxillofacial structures were performed using the standard protocol without intravenous contrast. Multiplanar CT image reconstructions of the cervical spine and maxillofacial structures were also generated. COMPARISON:  None available. FINDINGS: CT HEAD FINDINGS Brain: Generalized age-related cerebral atrophy with mild chronic small vessel ischemic disease. No acute intracranial hemorrhage. No acute large vessel territory infarct. No mass lesion, midline shift or mass effect. No hydrocephalus. No extra-axial fluid collection. Vascular: No hyperdense vessel. Scattered vascular calcifications noted within the carotid siphons. Skull: Scalp soft tissues and calvarium within normal limits. Other: Mastoid air cells are clear. CT MAXILLOFACIAL FINDINGS Osseous: Zygomatic arches intact. No acute maxillary fracture. Pterygoid plates intact. Nasal bones intact. Nasal septum relatively midline and intact. Visualized mandible intact. Mandibular condyles normally situated. No acute abnormality about the dentition. Orbits: Globes and orbital soft tissues within normal limits. Bony orbits intact. Sinuses: Maxillary sinuses nearly completely opacified, chronic in appearance. Paranasal sinuses are otherwise clear. Soft tissues: Soft tissue  contusion/hematoma present at the left face. CT CERVICAL SPINE FINDINGS Alignment: Straightening of the normal cervical lordosis. No listhesis. Skull base and vertebrae: Skull base intact. Normal C1-2 articulations are preserved in the dens is intact. Vertebral body heights maintained. No acute fracture. Soft tissues and spinal canal: Paraspinous soft tissues within normal limits. No abnormal prevertebral edema. Disc levels: Mild to moderate cervical spondylolysis at C4-5 through C6-7. Upper chest: Visualized upper chest demonstrates no acute finding. Partially visualized lung apices are grossly clear. Other: None. IMPRESSION: 1. No acute intracranial abnormality. 2. Mild age-related cerebral atrophy with chronic small vessel ischemic disease. 3. Soft tissue contusion/hematoma at the left face. 4. No acute maxillofacial fracture. 5. Chronic maxillary sinusitis. 6. No acute traumatic injury within the cervical spine. 7. Mild to moderate cervical spondylolysis at C4-5through C6-7. Electronically Signed   By: Jeannine Boga M.D.   On: 11/18/2017 06:52   Dg Pelvis Portable  Result Date: 11/18/2017 CLINICAL DATA:  Initial evaluation for acute trauma, fall, left hip pain. EXAM: PORTABLE PELVIS 1-2 VIEWS COMPARISON:  None. FINDINGS: There is irregular lucency extending through the intertrochanteric region of the left hip, suspicious for possible acute nondisplaced fracture. Possible subtle cortical step-off at the lesser trochanter. Visualized femur otherwise intact. Visualized pelvis intact. No acute soft tissue abnormality. IMPRESSION: Irregular lucency extending through the intertrochanteric region of the left femur, suspicious for acute nondisplaced fracture. Correlation with physical exam recommended. If clinical picture is  equivocal for acute fracture, further assessment with dedicated cross-sectional imaging may be helpful for further evaluation. Electronically Signed   By: Jeannine Boga M.D.   On:  11/18/2017 06:30   Dg C-arm 1-60 Min-no Report  Result Date: 11/18/2017 Fluoroscopy was utilized by the requesting physician.  No radiographic interpretation.   Dg Hip Operative Unilat W Or W/o Pelvis Left  Result Date: 11/18/2017 CLINICAL DATA:  Intramedullary nail EXAM: OPERATIVE left HIP (WITH PELVIS IF PERFORMED) for VIEWS TECHNIQUE: Fluoroscopic spot image(s) were submitted for interpretation post-operatively. COMPARISON:  Earlier same day FINDINGS: Four images show intramedullary nail and compression screw reduction of an intertrochanteric fracture, with 2 distal locking screws. Alignment appears near anatomic. IMPRESSION: Good appearance following ORIF for intertrochanteric fracture. Electronically Signed   By: Nelson Chimes M.D.   On: 11/18/2017 13:33   Dg Femur Portable 1 View Left  Result Date: 11/18/2017 CLINICAL DATA:  Initial evaluation for acute trauma, fall. EXAM: LEFT FEMUR PORTABLE 1 VIEW COMPARISON:  None. FINDINGS: Proximal femur and hip not well assessed on this examination. Femur otherwise intact with no other acute fracture or dislocation. Degenerative changes noted about the knee. No acute soft tissue abnormality. IMPRESSION: 1. No acute osseous abnormality about the left femur. 2. Please note that the proximal femur and hip not well assessed on this examination, better seen on concomitant radiograph of the pelvis. Electronically Signed   By: Jeannine Boga M.D.   On: 11/18/2017 06:28   Ct Maxillofacial Wo Contrast  Result Date: 11/18/2017 CLINICAL DATA:  Initial evaluation for acute trauma, fall. EXAM: CT HEAD WITHOUT CONTRAST CT MAXILLOFACIAL WITHOUT CONTRAST CT CERVICAL SPINE WITHOUT CONTRAST TECHNIQUE: Multidetector CT imaging of the head, cervical spine, and maxillofacial structures were performed using the standard protocol without intravenous contrast. Multiplanar CT image reconstructions of the cervical spine and maxillofacial structures were also generated.  COMPARISON:  None available. FINDINGS: CT HEAD FINDINGS Brain: Generalized age-related cerebral atrophy with mild chronic small vessel ischemic disease. No acute intracranial hemorrhage. No acute large vessel territory infarct. No mass lesion, midline shift or mass effect. No hydrocephalus. No extra-axial fluid collection. Vascular: No hyperdense vessel. Scattered vascular calcifications noted within the carotid siphons. Skull: Scalp soft tissues and calvarium within normal limits. Other: Mastoid air cells are clear. CT MAXILLOFACIAL FINDINGS Osseous: Zygomatic arches intact. No acute maxillary fracture. Pterygoid plates intact. Nasal bones intact. Nasal septum relatively midline and intact. Visualized mandible intact. Mandibular condyles normally situated. No acute abnormality about the dentition. Orbits: Globes and orbital soft tissues within normal limits. Bony orbits intact. Sinuses: Maxillary sinuses nearly completely opacified, chronic in appearance. Paranasal sinuses are otherwise clear. Soft tissues: Soft tissue contusion/hematoma present at the left face. CT CERVICAL SPINE FINDINGS Alignment: Straightening of the normal cervical lordosis. No listhesis. Skull base and vertebrae: Skull base intact. Normal C1-2 articulations are preserved in the dens is intact. Vertebral body heights maintained. No acute fracture. Soft tissues and spinal canal: Paraspinous soft tissues within normal limits. No abnormal prevertebral edema. Disc levels: Mild to moderate cervical spondylolysis at C4-5 through C6-7. Upper chest: Visualized upper chest demonstrates no acute finding. Partially visualized lung apices are grossly clear. Other: None. IMPRESSION: 1. No acute intracranial abnormality. 2. Mild age-related cerebral atrophy with chronic small vessel ischemic disease. 3. Soft tissue contusion/hematoma at the left face. 4. No acute maxillofacial fracture. 5. Chronic maxillary sinusitis. 6. No acute traumatic injury within the  cervical spine. 7. Mild to moderate cervical spondylolysis at C4-5through C6-7. Electronically Signed  By: Jeannine Boga M.D.   On: 11/18/2017 06:52        Scheduled Meds: . aspirin EC  325 mg Oral BID PC  . docusate sodium  100 mg Oral BID  . escitalopram  20 mg Oral Daily  . famotidine  20 mg Oral Daily  . Influenza vac split quadrivalent PF  0.5 mL Intramuscular Tomorrow-1000  . levothyroxine  125 mcg Oral Q0600  . pneumococcal 23 valent vaccine  0.5 mL Intramuscular Tomorrow-1000  . pravastatin  40 mg Oral QHS  . traMADol  50 mg Oral Q6H   Continuous Infusions: . dextrose 5 % and 0.45% NaCl 75 mL/hr at 11/19/17 0537  . methocarbamol (ROBAXIN) IV Stopped (11/18/17 0846)  . methocarbamol (ROBAXIN) IV       LOS: 1 day    Time spent: 35 minutes    Elmarie Shiley, MD [facility] Pager [phone]  If 7PM-7AM, please contact night-coverage [email or web address] Password [email or web address] 11/19/2017, 9:06 AM

## 2017-11-19 NOTE — Progress Notes (Signed)
Subjective: 1 Day Post-Op Procedure(s) (LRB): INTRAMEDULLARY (IM) NAIL INTERTROCHANTRIC (Left) Patient reports pain as mild. Taking by mouth. Voiding okay. Has good support system at home. Ambulated with physical therapy in hall. No dizziness. Up in chair.  Objective: Vital signs in last 24 hours: Temp:  [97.5 F (36.4 C)-99.4 F (37.4 C)] 98.4 F (36.9 C) (11/11 1320) Pulse Rate:  [75-87] 85 (11/11 1320) Resp:  [16] 16 (11/11 1320) BP: (104-120)/(52-69) 112/61 (11/11 1320) SpO2:  [90 %-96 %] 90 % (11/11 1320)  Intake/Output from previous day: 11/10 0701 - 11/11 0700 In: 1867.8 [P.O.:240; I.V.:1200; IV Piggyback:27.8] Out: 1375 [Urine:1225; Blood:150] Intake/Output this shift: Total I/O In: 1076.9 [P.O.:600; I.V.:476.9] Out: 1100 [Urine:1100]  Recent Labs    11/18/17 0605 11/19/17 0443  HGB 14.9 11.1*   Recent Labs    11/18/17 0605 11/19/17 0443  WBC 8.4 14.1*  RBC 4.63 3.53*  HCT 44.8 34.0*  PLT 291 257   Recent Labs    11/18/17 0605 11/19/17 0443  NA 136 134*  K 3.7 4.7  CL 104 101  CO2 22 27  BUN 14 15  CREATININE 0.62 0.67  GLUCOSE 117* 134*  CALCIUM 8.9 8.5*   No results for input(s): LABPT, INR in the last 72 hours. Left hip exam: Sensation intact distally Intact pulses distally Incision: dressing C/D/I Compartment soft  Has chronic foot drop on left.  Assessment/Plan: 1 Day Post-Op Procedure(s) (LRB): INTRAMEDULLARY (IM) NAIL INTERTROCHANTRIC (Left)  Plan: Up with therapy Discharge home with home health in 1-2 days. Aspirin 325 mg twice daily with food for DVT prophylaxis. Weight-bear as tolerated on left. Will follow.    Erlene Senters 11/19/2017, 2:29 PM
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
## 2017-11-20 ENCOUNTER — Inpatient Hospital Stay (HOSPITAL_COMMUNITY): Payer: Medicare Other

## 2017-11-20 LAB — CBC
HCT: 33.7 % — ABNORMAL LOW (ref 36.0–46.0)
HEMOGLOBIN: 10.9 g/dL — AB (ref 12.0–15.0)
MCH: 31.7 pg (ref 26.0–34.0)
MCHC: 32.3 g/dL (ref 30.0–36.0)
MCV: 98 fL (ref 80.0–100.0)
NRBC: 0 % (ref 0.0–0.2)
Platelets: 215 10*3/uL (ref 150–400)
RBC: 3.44 MIL/uL — AB (ref 3.87–5.11)
RDW: 11.9 % (ref 11.5–15.5)
WBC: 12 10*3/uL — AB (ref 4.0–10.5)

## 2017-11-20 MED ORDER — INFLUENZA VAC SPLIT HIGH-DOSE 0.5 ML IM SUSY
0.5000 mL | PREFILLED_SYRINGE | INTRAMUSCULAR | Status: DC
  Filled 2017-11-20: qty 0.5

## 2017-11-20 MED ORDER — SODIUM CHLORIDE 0.9 % IV SOLN
1.0000 g | INTRAVENOUS | Status: DC
  Administered 2017-11-20 – 2017-11-21 (×2): 1 g via INTRAVENOUS
  Filled 2017-11-20 (×2): qty 1

## 2017-11-20 MED ORDER — SENNOSIDES-DOCUSATE SODIUM 8.6-50 MG PO TABS
1.0000 | ORAL_TABLET | Freq: Two times a day (BID) | ORAL | Status: DC
  Administered 2017-11-20 – 2017-11-21 (×3): 1 via ORAL
  Filled 2017-11-20 (×3): qty 1

## 2017-11-20 MED ORDER — SODIUM CHLORIDE 0.9 % IV SOLN
500.0000 mg | INTRAVENOUS | Status: DC
  Administered 2017-11-20: 500 mg via INTRAVENOUS
  Filled 2017-11-20 (×2): qty 500

## 2017-11-20 MED ORDER — POLYETHYLENE GLYCOL 3350 17 G PO PACK
17.0000 g | PACK | Freq: Two times a day (BID) | ORAL | Status: DC
  Filled 2017-11-20 (×2): qty 1

## 2017-11-20 NOTE — Care Management Note (Signed)
Case Management Note  Patient Details  Name: Holly Cochran MRN: [id number] Date of Birth: Mar 25, 1950  Subjective/Objective:     Discharge planning, spoke with patient and spouse at beside. Chose [facility] for [facility] services, PT to eval and treat.              Action/Plan: Contacted [facility] for referral. Has RW and 3-n-1. [phone]    Expected Discharge Date:                  Expected Discharge Plan:  [place]  In-House Referral:  NA  Discharge planning Services  CM Consult  Post Acute Care Choice:  Home Health Choice offered to:  Patient  DME Arranged:  N/A DME Agency:  NA  HH Arranged:  PT [place] Agency:  [place]  Status of Service:  Completed, signed off  If discussed at [place] of Stay Meetings, dates discussed:    Additional Comments:  Guadalupe Maple, RN 11/20/2017, 1:14 PM

## 2017-11-20 NOTE — Progress Notes (Signed)
PROGRESS NOTE    Holly Cochran  CHE:[id number] DOB: 04-Dec-1950 DOA: 11/18/2017 PCP: Binnie Rail, MD    Brief Narrative:  Holly Cochran is a 67 y.o. female with medical history significant of hyperlipidemia, hypothyroidism, anxiety/depression, GERD who presents to the emergency department from home today with complaints of severe pain on the left hip.  Patient was trying to get out of her bed but unfortunately she rolled off on her left side and fell.  She hit her left hip and left side of the head.  Immediately she developed severe pain on the left hip.  She also had some bruises on the left side of the face.  She did not lose any consciousness.  Complained of pain on the left side of her head and face.  Denies any neck or back pain. Imagings done in the emergency department showed acute nondisplaced left-sided intertrochanteric fracture of femur. Patient seen and examined the bedside in the emergency department.  She was found to be hemodynamically stable.  She was complaining of constant pain and muscle spasms on the left hip.  She denies any chest pain, shortness of breath, palpitations, abdominal pain, dysuria, nausea, vomiting, fever, chills, headache, blurry vision.  ED Course: X-ray confirmed acute nondisplaced intertrochanteric fracture of left femur.  Orthopedics consulted.  Planning follow-up today.  Admitted after fall, found to have left femur fracture. Notice to have leukocytosis, hypoxemia, treating for PNA.    Assessment & Plan:   Principal Problem:   Intertrochanteric fracture of left femur (HCC) Active Problems:   Hypothyroidism, postradioiodine therapy   Hyperlipidemia   Osteopenia   GERD (gastroesophageal reflux disease)   Adjustment disorder with depressed mood  Left intertrochanteric fracture of femur; Underwent sx 11-10.  PT per Ortho.  DVT prophylaxis per ortho.  She was having back pain. Lumbar spine x ray negative for fracture.   Hypothyroidism;    Continue with synthroid.   HLD; continue with statins.   Depression; continue with lexapro.   PNA., acute hypoxic respiratory failure/ Leukocytosis;  She is mildly hypoxic, WBC elevated, T 99. Will treat for PNA chest x ray with atelectasis.  Chest x ray with atelectasis.  WBC trending down.  UA negative.   Mild hyponatremia; started  IV fluids. Repeat labs in am.   Acute blood loss anemia; expected, post surgery.  Hb trending down. No need for Blood transfusion currently. Repeat hb in am.  14--11---10.9  Constipation; on miralax.   RN Pressure Injury Documentation:    Malnutrition Type:      Malnutrition Characteristics:      Nutrition Interventions:     Estimated body mass index is 29.45 kg/m as calculated from the following:   Height as of this encounter: 5\' 5"  (1.651 m).   Weight as of this encounter: 80.3 kg.   DVT prophylaxis: Aspirin  Code Status: Full code.  Family Communication: care discussed with patient.  Disposition Plan: awaiting PT recommendation, day 1 post sx.   Consultants:   Ortho    Procedures:   sx   Antimicrobials: none   Subjective: Complaining of hip pain, no BM yet. Mild dyspnea.    Objective: Vitals:   11/20/17 0140 11/20/17 0519 11/20/17 1000 11/20/17 1030  BP: 127/61 130/77 116/73   Pulse: 89 (!) 106 95   Resp: 17 18 16    Temp: 98.9 F (37.2 C) 98.6 F (37 C) 99.3 F (37.4 C)   TempSrc: Oral Oral Oral   SpO2: 92% 93% (!) 87%  94%  Weight:      Height:        Intake/Output Summary (Last 24 hours) at 11/20/2017 1408 Last data filed at 11/20/2017 1143 Gross per 24 hour  Intake 1135.81 ml  Output 1650 ml  Net -514.19 ml   Filed Weights   11/18/17 0450  Weight: 80.3 kg    Examination:  General exam: sick appearing.  Respiratory system: decrease breath sound.  Cardiovascular system: S 1, S 2 RRR Gastrointestinal system: BS present, soft, nt Central nervous system: sleepy  Extremities: Symmetric  5 x 5 power. Left Hip with dressing.  Skin: No rashes.   Data Reviewed: I have personally reviewed following labs and imaging studies  CBC: Recent Labs  Lab 11/18/17 0605 11/19/17 0443 11/20/17 0438  WBC 8.4 14.1* 12.0*  NEUTROABS 5.2  --   --   HGB 14.9 11.1* 10.9*  HCT 44.8 34.0* 33.7*  MCV 96.8 96.3 98.0  PLT 291 257 161   Basic Metabolic Panel: Recent Labs  Lab 11/18/17 0605 11/19/17 0443  NA 136 134*  K 3.7 4.7  CL 104 101  CO2 22 27  GLUCOSE 117* 134*  BUN 14 15  CREATININE 0.62 0.67  CALCIUM 8.9 8.5*   GFR: Estimated Creatinine Clearance: 72.4 mL/min (by C-G formula based on SCr of 0.67 mg/dL). Liver Function Tests: No results for input(s): AST, ALT, ALKPHOS, BILITOT, PROT, ALBUMIN in the last 168 hours. No results for input(s): LIPASE, AMYLASE in the last 168 hours. No results for input(s): AMMONIA in the last 168 hours. Coagulation Profile: No results for input(s): INR, PROTIME in the last 168 hours. Cardiac Enzymes: No results for input(s): CKTOTAL, CKMB, CKMBINDEX, TROPONINI in the last 168 hours. BNP (last 3 results) No results for input(s): PROBNP in the last 8760 hours. HbA1C: No results for input(s): HGBA1C in the last 72 hours. CBG: No results for input(s): GLUCAP in the last 168 hours. Lipid Profile: No results for input(s): CHOL, HDL, LDLCALC, TRIG, CHOLHDL, LDLDIRECT in the last 72 hours. Thyroid Function Tests: No results for input(s): TSH, T4TOTAL, FREET4, T3FREE, THYROIDAB in the last 72 hours. Anemia Panel: No results for input(s): VITAMINB12, FOLATE, FERRITIN, TIBC, IRON, RETICCTPCT in the last 72 hours. Sepsis Labs: No results for input(s): PROCALCITON, LATICACIDVEN in the last 168 hours.  No results found for this or any previous visit (from the past 240 hour(s)).       Radiology Studies: Dg Lumbar Spine 2-3 Views  Result Date: 11/20/2017 CLINICAL DATA:  Chronic right-sided back pain. Severe proximal left leg pain. Recent  open reduction and internal fixation of intertrochanteric fracture of the proximal left femur. EXAM: LUMBAR SPINE - 2-3 VIEW COMPARISON:  None. FINDINGS: There is no evidence of lumbar spine fracture. Alignment is normal. Intervertebral disc spaces are maintained. IMPRESSION: Negative. Electronically Signed   By: Lorriane Shire M.D.   On: 11/20/2017 10:00   Dg Chest Port 1 View  Result Date: 11/19/2017 CLINICAL DATA:  Elevated white blood cell count EXAM: PORTABLE CHEST 1 VIEW COMPARISON:  None. FINDINGS: The lungs are borderline hypoinflated. There is linear increased density in the right perihilar region. The left lung is clear. The heart and pulmonary vascularity are normal. The mediastinum is normal in width. The bony thorax exhibits no acute abnormality. IMPRESSION: Probable subsegmental atelectasis in the right perihilar region. No alveolar pneumonia, CHF, nor other acute cardiopulmonary abnormality. Electronically Signed   By: David  Martinique M.D.   On: 11/19/2017 10:49   Dg Femur  Min 2 Views Left  Result Date: 11/20/2017 CLINICAL DATA:  Severe left femur pain. Recent open reduction internal fixation of intertrochanteric fracture of the proximal left femur. EXAM: LEFT FEMUR 2 VIEWS COMPARISON:  Radiographs dated 11/18/2017 FINDINGS: Intramedullary nail and proximal and distal locking screws appear in good position. There is minimal displacement of the intertrochanteric fracture of the proximal left femur. No acute abnormalities. IMPRESSION: Satisfactory appearance of the left hip after open reduction and internal fixation of intertrochanteric fracture. Electronically Signed   By: Lorriane Shire M.D.   On: 11/20/2017 10:03        Scheduled Meds: . aspirin EC  325 mg Oral BID PC  . docusate sodium  100 mg Oral BID  . escitalopram  20 mg Oral Daily  . famotidine  20 mg Oral Daily  . Influenza vac split quadrivalent PF  0.5 mL Intramuscular Tomorrow-1000  . levothyroxine  125 mcg Oral Q0600   . pneumococcal 23 valent vaccine  0.5 mL Intramuscular Tomorrow-1000  . polyethylene glycol  17 g Oral BID  . pravastatin  40 mg Oral QHS  . traMADol  50 mg Oral Q6H   Continuous Infusions: . sodium chloride 75 mL/hr at 11/20/17 1043  . azithromycin 500 mg (11/20/17 1241)  . cefTRIAXone (ROCEPHIN)  IV 1 g (11/20/17 1154)  . methocarbamol (ROBAXIN) IV       LOS: 2 days    Time spent: 35 minutes    Elmarie Shiley, MD [facility] Pager [phone]  If 7PM-7AM, please contact night-coverage [email or web address] Password [facility] 11/20/2017, 2:08 PM
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
## 2017-11-20 NOTE — Plan of Care (Signed)

## 2017-11-20 NOTE — Progress Notes (Signed)
Subjective: 2 Days Post-Op Procedure(s) (LRB): INTRAMEDULLARY (IM) NAIL INTERTROCHANTRIC (Left) Patient reports pain as moderate.  The patient was doing well up until recently when she had a twisting injury and then started having worsening pain in her left hip.  She became concerned about it and we discussed that this morning.  Objective: Vital signs in last 24 hours: Temp:  [98.4 F (36.9 C)-99.4 F (37.4 C)] 98.6 F (37 C) (11/12 0519) Pulse Rate:  [84-106] 106 (11/12 0519) Resp:  [16-18] 18 (11/12 0519) BP: (108-130)/(53-77) 130/77 (11/12 0519) SpO2:  [88 %-93 %] 93 % (11/12 0519)  Intake/Output from previous day: 11/11 0701 - 11/12 0700 In: 1807 [P.O.:900; I.V.:907] Out: 2500 [Urine:2500] Intake/Output this shift: Total I/O In: 405.7 [I.V.:165.7; Other:240] Out: -   Recent Labs    11/18/17 0605 11/19/17 0443 11/20/17 0438  HGB 14.9 11.1* 10.9*   Recent Labs    11/19/17 0443 11/20/17 0438  WBC 14.1* 12.0*  RBC 3.53* 3.44*  HCT 34.0* 33.7*  PLT 257 215   Recent Labs    11/18/17 0605 11/19/17 0443  NA 136 134*  K 3.7 4.7  CL 104 101  CO2 22 27  BUN 14 15  CREATININE 0.62 0.67  GLUCOSE 117* 134*  CALCIUM 8.9 8.5*   No results for input(s): LABPT, INR in the last 72 hours.  Neurologically intact ABD soft Neurovascular intact Sensation intact distally Intact pulses distally No cellulitis present Compartment soft X-ray: X-rays of her hip are completely unremarkable and show absolute anatomic alignment of her fracture.  X-rays of her back are unremarkable.   Assessment/Plan: 2 Days Post-Op Procedure(s) (LRB): INTRAMEDULLARY (IM) NAIL INTERTROCHANTRIC (Left) Advance diet Up with therapy Plan for discharge tomorrow Discharge home with home health    Holly Cochran 11/20/2017, 10:17 AM
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
## 2017-11-20 NOTE — Plan of Care (Signed)
Pt alert and oriented, resting with family at the bedside. Pain much better today per pt.  Xrays done this am. Progressing with PT plan of care discussed with pt. RN will monitor.

## 2017-11-21 DIAGNOSIS — F4321 Adjustment disorder with depressed mood: Secondary | ICD-10-CM

## 2017-11-21 DIAGNOSIS — E890 Postprocedural hypothyroidism: Secondary | ICD-10-CM

## 2017-11-21 DIAGNOSIS — J189 Pneumonia, unspecified organism: Secondary | ICD-10-CM

## 2017-11-21 DIAGNOSIS — K219 Gastro-esophageal reflux disease without esophagitis: Secondary | ICD-10-CM

## 2017-11-21 DIAGNOSIS — S72145S Nondisplaced intertrochanteric fracture of left femur, sequela: Secondary | ICD-10-CM

## 2017-11-21 DIAGNOSIS — E7849 Other hyperlipidemia: Secondary | ICD-10-CM

## 2017-11-21 LAB — BASIC METABOLIC PANEL
ANION GAP: 7 (ref 5–15)
BUN: 10 mg/dL (ref 8–23)
CHLORIDE: 104 mmol/L (ref 98–111)
CO2: 25 mmol/L (ref 22–32)
Calcium: 7.9 mg/dL — ABNORMAL LOW (ref 8.9–10.3)
Creatinine, Ser: 0.62 mg/dL (ref 0.44–1.00)
GFR calc non Af Amer: >60 mL/min (ref >60)
Glucose, Bld: 95 mg/dL (ref 70–99)
POTASSIUM: 3.9 mmol/L (ref 3.5–5.1)
SODIUM: 136 mmol/L (ref 135–145)

## 2017-11-21 LAB — CBC
HCT: 29.7 % — ABNORMAL LOW (ref 36.0–46.0)
HEMOGLOBIN: 9.4 g/dL — AB (ref 12.0–15.0)
MCH: 31.9 pg (ref 26.0–34.0)
MCHC: 31.6 g/dL (ref 30.0–36.0)
MCV: 100.7 fL — ABNORMAL HIGH (ref 80.0–100.0)
NRBC: 0 % (ref 0.0–0.2)
PLATELETS: 188 10*3/uL (ref 150–400)
RBC: 2.95 MIL/uL — AB (ref 3.87–5.11)
RDW: 12 % (ref 11.5–15.5)
WBC: 6.8 10*3/uL (ref 4.0–10.5)

## 2017-11-21 MED ORDER — POLYETHYLENE GLYCOL 3350 17 G PO PACK: 17.0000 g | PACK | Freq: Two times a day (BID) | ORAL | 0 refills | Status: DC

## 2017-11-21 MED ORDER — TIZANIDINE HCL 2 MG PO TABS: 2.0000 mg | ORAL_TABLET | Freq: Three times a day (TID) | ORAL | 0 refills | Status: DC | PRN

## 2017-11-21 MED ORDER — SENNOSIDES-DOCUSATE SODIUM 8.6-50 MG PO TABS: 1.0000 | ORAL_TABLET | Freq: Two times a day (BID) | ORAL | 0 refills | Status: AC

## 2017-11-21 MED ORDER — OXYCODONE-ACETAMINOPHEN 5-325 MG PO TABS: 1.0000 | ORAL_TABLET | Freq: Four times a day (QID) | ORAL | 0 refills | Status: DC | PRN

## 2017-11-21 MED ORDER — ASPIRIN EC 325 MG PO TBEC: 325.0000 mg | DELAYED_RELEASE_TABLET | Freq: Two times a day (BID) | ORAL | 0 refills | Status: AC

## 2017-11-21 MED ORDER — AMOXICILLIN-POT CLAVULANATE 875-125 MG PO TABS: 1.0000 | ORAL_TABLET | Freq: Two times a day (BID) | ORAL | 0 refills | Status: AC

## 2017-11-21 MED ORDER — AZITHROMYCIN 250 MG PO TABS
500.0000 mg | ORAL_TABLET | Freq: Every day | ORAL | Status: DC
  Filled 2017-11-21: qty 2

## 2017-11-21 NOTE — Plan of Care (Signed)
Plan of care reviewed and discussed with the patient. 
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
## 2017-11-21 NOTE — Progress Notes (Signed)
PHARMACIST - PHYSICIAN COMMUNICATION  CONCERNING: Antibiotic IV to Oral Route Change Policy  RECOMMENDATION: This patient is receiving azithromycin by the intravenous route.  Based on criteria approved by the Pharmacy and Therapeutics Committee, the antibiotic(s) is/are being converted to the equivalent oral dose form(s).   DESCRIPTION: These criteria include:  Patient being treated for a respiratory tract infection, urinary tract infection, cellulitis or clostridium difficile associated diarrhea if on metronidazole  The patient is not neutropenic and does not exhibit a GI malabsorption state  The patient is eating (either orally or via tube) and/or has been taking other orally administered medications for a least 24 hours  The patient is improving clinically and has a Tmax < 100.5  If you have questions about this conversion, please contact the Pharmacy Department  []   [phone] )  [facility] []   [phone] )  [facility] []   [phone] )  [facility] []   [phone] )  [facility] [x]   [phone] )  [place], [place].D [phone] 11/21/2017 12:09 PM
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
## 2017-11-21 NOTE — Progress Notes (Signed)
Discharge paperwork discussed with pt and significant other.  They demonstrated understanding, and pt was escorted by wheelchair in stable condition to main lobby.

## 2017-11-21 NOTE — Discharge Summary (Signed)
Discharge Summary  [name] IRS:[id number] DOB: 28-Apr-1950  PCP: Binnie Rail, MD  Admit date: 11/18/2017 Discharge date: 11/21/2017  Time spent: 35 mins  Recommendations for Outpatient Follow-up:  1. PCP in 1 week 2. Orthopedics in 2 weeks  Discharge Diagnoses:  Active Hospital Problems   Diagnosis Date Noted  . Intertrochanteric fracture of left femur ([place]) 11/18/2017  . Adjustment disorder with depressed mood 02/05/2017  . GERD (gastroesophageal reflux disease) 02/08/2011  . Hyperlipidemia 08/27/2007  . Osteopenia 08/27/2007  . Hypothyroidism, postradioiodine therapy 06/01/2006    Resolved Hospital Problems  No resolved problems to display.    Discharge Condition: Stable  Diet recommendation: Heart healthy  Vitals:   11/20/17 1937 11/21/17 0542  BP:  106/69  Pulse: 100 89  Resp: 18 16  Temp:  97.9 F (36.6 C)  SpO2:  92%    History of present illness:  Holly Fabre Pearsonis a 67 y.o.femalewith medical history significant ofhyperlipidemia, hypothyroidism, anxiety/depression, GERD who presents to the emergency department with complaints of severe pain on the left hip. Patient was trying to get out of her bed but unfortunately she rolled off on her left side and fell.She hit her left hip and left side of the head. Immediately she developed severe pain on the left hip. She also had some bruises on the left side of the face. She did not lose any consciousness. Complainedof pain on the left side of her head and face. Denies any neck or back pain. Imagings done in the emergency department showed acute nondisplaced left-sided intertrochanteric fracture of femur. Pt admitted for further management. Orthopedics consulted.  Today, pt reported feeling much better, pain controlled with pain meds. Denies any chest pain, SOB, abdominal pain, fever/chills   Hospital Course:  Principal Problem:   Intertrochanteric fracture of left femur ([facility]) Active  Problems:   Hypothyroidism, postradioiodine therapy   Hyperlipidemia   Osteopenia   GERD (gastroesophageal reflux disease)   Adjustment disorder with depressed mood  Left intertrochanteric fracture of femur s/p intramedullary rod fixation on 11/18/17 Management by orthopedics including DVT ppx Follow up with Orthopedics in 2 weeks HHPT upon d/c Pain management and bowel regimen  Acute blood loss anemia, likely post surgery, hemodilution  Baseline around 14, down-trending slowly to 9.4, no signs of active bleeding PCP to follow with repeat labs  Acute hypoxic respiratory failure likely 2/2 ?CAP Vs atelectasis   Afebrile, leukocytosis resolved Currently saturating well on RA CXR with atelectasis S/P IV rocephin + azithromycin, will d/c on PO Augmentin for a total of 5 days Encourage incentive spirometry   Hypothyroidism Continue with synthroid   HLD Continue with statins   Depression Continue with lexapro    Procedures:  S/P intramedullary rod fixation on 11/18/17  Consultations:  Orthopedics   Discharge Exam: BP 106/69 (BP Location: Right Arm)   Pulse 89   Temp 97.9 F (36.6 C) (Oral)   Resp 16   Ht 5\' 5"  (1.651 m)   Wt 80.3 kg   LMP 01/09/2005   SpO2 92%   BMI 29.45 kg/m   General: NAD  Cardiovascular: S1, S2 present Respiratory: CTAB  Discharge Instructions You were cared for by a hospitalist during your hospital stay. If you have any questions about your discharge medications or the care you received while you were in the hospital after you are discharged, you can call the unit and asked to speak with the hospitalist on call if the hospitalist that took care of you is not  available. Once you are discharged, your primary care physician will handle any further medical issues. Please note that NO REFILLS for any discharge medications will be authorized once you are discharged, as it is imperative that you return to your primary care physician (or  establish a relationship with a primary care physician if you do not have one) for your aftercare needs so that they can reassess your need for medications and monitor your lab values.  Discharge Instructions    Weight bearing as tolerated   Complete by:  As directed    Laterality:  left   Extremity:  Lower     Allergies as of 11/21/2017      Reactions   Sulfa Antibiotics    Rash Because of a history of documented adverse serious drug reaction;Medi Alert bracelet  is recommended   Sulfasalazine Anaphylaxis   Rash Because of a history of documented adverse serious drug reaction;Medi Alert braceletis recommended   Codone [hydrocodone] Itching   Sulfacetamide Sodium    Rash Because of a history of documented adverse serious drug reaction;Medi Alert braceletis recommended   Prednisone Rash   REACTION: FUNGAL INFECTION-MOUTH (?) REACTION: FUNGAL INFECTION-MOUTH (?)      Medication List    STOP taking these medications   aspirin 81 MG tablet Replaced by:  aspirin EC 325 MG tablet   buPROPion 150 MG 24 hr tablet Commonly known as:  WELLBUTRIN XL   pseudoephedrine 120 MG 12 hr tablet Commonly known as:  SUDAFED     TAKE these medications   amoxicillin-clavulanate 875-125 MG tablet Commonly known as:  AUGMENTIN Take 1 tablet by mouth 2 (two) times daily for 3 days. Start taking on:  11/22/2017   aspirin EC 325 MG tablet Take 1 tablet (325 mg total) by mouth 2 (two) times daily after a meal. Take x 1 month post op to decrease risk of blood clots. Replaces:  aspirin 81 MG tablet   escitalopram 20 MG tablet Commonly known as:  LEXAPRO Take 1 tablet (20 mg total) by mouth daily.   fish oil-omega-3 fatty acids 1000 MG capsule Take 2 g by mouth daily.   fluticasone 50 MCG/ACT nasal spray Commonly known as:  FLONASE USE TWO SPRAY IN EACH NOSTRIL EVERY DAY   ibuprofen 200 MG tablet Commonly known as:  ADVIL,MOTRIN Take 400 mg by mouth every 6 (six) hours as needed for  mild pain.   levothyroxine 125 MCG tablet Commonly known as:  SYNTHROID, LEVOTHROID TAKE 1 TABLET BY MOUTH ONCE DAILY EXCEPT  TAKE  1/2  TABLET  ON  TUESDAY  ,THURSDAY  AND  SATURDAY What changed:  Another medication with the same name was removed. Continue taking this medication, and follow the directions you see here.   oxyCODONE-acetaminophen 5-325 MG tablet Commonly known as:  PERCOCET/ROXICET Take 1 tablet by mouth every 6 (six) hours as needed for severe pain.   polyethylene glycol packet Commonly known as:  MIRALAX / GLYCOLAX Take 17 g by mouth 2 (two) times daily.   pravastatin 40 MG tablet Commonly known as:  PRAVACHOL Take 1 tablet (40 mg total) by mouth at bedtime.   ranitidine 150 MG tablet Commonly known as:  ZANTAC Take 1 tablet (150 mg total) by mouth 2 (two) times daily.   senna-docusate 8.6-50 MG tablet Commonly known as:  Senokot-S Take 1 tablet by mouth 2 (two) times daily.   Vitamin D3 25 MCG (1000 UT) Caps Take 1,000 Units by mouth daily.  Durable Medical Equipment  (From admission, onward)         Start     Ordered   11/20/17 1535  For home use only DME 3 n 1  Once     11/20/17 1535   11/19/17 1406  For home use only DME Walker rolling  Once    Question:  Patient needs a walker to treat with the following condition  Answer:  Surgery, elective   11/19/17 1406   11/19/17 1406  For home use only DME Bedside commode  Once    Question:  Patient needs a bedside commode to treat with the following condition  Answer:  Surgery, elective   11/19/17 1406           Discharge Care Instructions  (From admission, onward)         Start     Ordered   11/18/17 0000  Weight bearing as tolerated    Question Answer Comment  Laterality left   Extremity Lower      11/18/17 1128         Allergies  Allergen Reactions  . Sulfa Antibiotics     Rash Because of a history of documented adverse serious drug reaction;Medi Alert bracelet  is  recommended  . Sulfasalazine Anaphylaxis    Rash Because of a history of documented adverse serious drug reaction;Medi Alert braceletis recommended  . Codone [Hydrocodone] Itching  . Sulfacetamide Sodium     Rash Because of a history of documented adverse serious drug reaction;Medi Alert braceletis recommended  . Prednisone Rash    REACTION: FUNGAL INFECTION-MOUTH (?) REACTION: FUNGAL INFECTION-MOUTH (?)   Follow-up Information    Dorna Leitz, MD. Schedule an appointment as soon as possible for a visit in 2 weeks.   Specialty:  Orthopedic Surgery Contact information: [place] [address] [place], [facility] up.   Specialty:  [place] Why:  physical therapy Contact information: [address] [place] [address] [phone]        Binnie Rail, MD. Schedule an appointment as soon as possible for a visit in 1 week(s).   Specialty:  Internal Medicine Contact information: [place] [address] [phone]            The results of significant diagnostics from this hospitalization (including imaging, microbiology, ancillary and laboratory) are listed below for reference.    Significant Diagnostic Studies: Dg Lumbar Spine 2-3 Views  Result Date: 11/20/2017 CLINICAL DATA:  Chronic right-sided back pain. Severe proximal left leg pain. Recent open reduction and internal fixation of intertrochanteric fracture of the proximal left femur. EXAM: LUMBAR SPINE - 2-3 VIEW COMPARISON:  None. FINDINGS: There is no evidence of lumbar spine fracture. Alignment is normal. Intervertebral disc spaces are maintained. IMPRESSION: Negative. Electronically Signed   By: Lorriane Shire M.D.   On: 11/20/2017 10:00   Ct Head Wo Contrast  Result Date: 11/18/2017 CLINICAL DATA:  Initial evaluation for acute trauma, fall. EXAM: CT HEAD WITHOUT CONTRAST CT MAXILLOFACIAL WITHOUT CONTRAST CT CERVICAL  SPINE WITHOUT CONTRAST TECHNIQUE: Multidetector CT imaging of the head, cervical spine, and maxillofacial structures were performed using the standard protocol without intravenous contrast. Multiplanar CT image reconstructions of the cervical spine and maxillofacial structures were also generated. COMPARISON:  None available. FINDINGS: CT HEAD FINDINGS Brain: Generalized age-related cerebral atrophy with mild chronic small vessel ischemic disease. No acute intracranial hemorrhage. No acute large  vessel territory infarct. No mass lesion, midline shift or mass effect. No hydrocephalus. No extra-axial fluid collection. Vascular: No hyperdense vessel. Scattered vascular calcifications noted within the carotid siphons. Skull: Scalp soft tissues and calvarium within normal limits. Other: Mastoid air cells are clear. CT MAXILLOFACIAL FINDINGS Osseous: Zygomatic arches intact. No acute maxillary fracture. Pterygoid plates intact. Nasal bones intact. Nasal septum relatively midline and intact. Visualized mandible intact. Mandibular condyles normally situated. No acute abnormality about the dentition. Orbits: Globes and orbital soft tissues within normal limits. Bony orbits intact. Sinuses: Maxillary sinuses nearly completely opacified, chronic in appearance. Paranasal sinuses are otherwise clear. Soft tissues: Soft tissue contusion/hematoma present at the left face. CT CERVICAL SPINE FINDINGS Alignment: Straightening of the normal cervical lordosis. No listhesis. Skull base and vertebrae: Skull base intact. Normal C1-2 articulations are preserved in the dens is intact. Vertebral body heights maintained. No acute fracture. Soft tissues and spinal canal: Paraspinous soft tissues within normal limits. No abnormal prevertebral edema. Disc levels: Mild to moderate cervical spondylolysis at C4-5 through C6-7. Upper chest: Visualized upper chest demonstrates no acute finding. Partially visualized lung apices are grossly clear.  Other: None. IMPRESSION: 1. No acute intracranial abnormality. 2. Mild age-related cerebral atrophy with chronic small vessel ischemic disease. 3. Soft tissue contusion/hematoma at the left face. 4. No acute maxillofacial fracture. 5. Chronic maxillary sinusitis. 6. No acute traumatic injury within the cervical spine. 7. Mild to moderate cervical spondylolysis at C4-5through C6-7. Electronically Signed   By: Jeannine Boga M.D.   On: 11/18/2017 06:52   Ct Cervical Spine Wo Contrast  Result Date: 11/18/2017 CLINICAL DATA:  Initial evaluation for acute trauma, fall. EXAM: CT HEAD WITHOUT CONTRAST CT MAXILLOFACIAL WITHOUT CONTRAST CT CERVICAL SPINE WITHOUT CONTRAST TECHNIQUE: Multidetector CT imaging of the head, cervical spine, and maxillofacial structures were performed using the standard protocol without intravenous contrast. Multiplanar CT image reconstructions of the cervical spine and maxillofacial structures were also generated. COMPARISON:  None available. FINDINGS: CT HEAD FINDINGS Brain: Generalized age-related cerebral atrophy with mild chronic small vessel ischemic disease. No acute intracranial hemorrhage. No acute large vessel territory infarct. No mass lesion, midline shift or mass effect. No hydrocephalus. No extra-axial fluid collection. Vascular: No hyperdense vessel. Scattered vascular calcifications noted within the carotid siphons. Skull: Scalp soft tissues and calvarium within normal limits. Other: Mastoid air cells are clear. CT MAXILLOFACIAL FINDINGS Osseous: Zygomatic arches intact. No acute maxillary fracture. Pterygoid plates intact. Nasal bones intact. Nasal septum relatively midline and intact. Visualized mandible intact. Mandibular condyles normally situated. No acute abnormality about the dentition. Orbits: Globes and orbital soft tissues within normal limits. Bony orbits intact. Sinuses: Maxillary sinuses nearly completely opacified, chronic in appearance. Paranasal sinuses  are otherwise clear. Soft tissues: Soft tissue contusion/hematoma present at the left face. CT CERVICAL SPINE FINDINGS Alignment: Straightening of the normal cervical lordosis. No listhesis. Skull base and vertebrae: Skull base intact. Normal C1-2 articulations are preserved in the dens is intact. Vertebral body heights maintained. No acute fracture. Soft tissues and spinal canal: Paraspinous soft tissues within normal limits. No abnormal prevertebral edema. Disc levels: Mild to moderate cervical spondylolysis at C4-5 through C6-7. Upper chest: Visualized upper chest demonstrates no acute finding. Partially visualized lung apices are grossly clear. Other: None. IMPRESSION: 1. No acute intracranial abnormality. 2. Mild age-related cerebral atrophy with chronic small vessel ischemic disease. 3. Soft tissue contusion/hematoma at the left face. 4. No acute maxillofacial fracture. 5. Chronic maxillary sinusitis. 6. No acute traumatic injury within the cervical  spine. 7. Mild to moderate cervical spondylolysis at C4-5through C6-7. Electronically Signed   By: Jeannine Boga M.D.   On: 11/18/2017 06:52   Dg Pelvis Portable  Result Date: 11/18/2017 CLINICAL DATA:  Initial evaluation for acute trauma, fall, left hip pain. EXAM: PORTABLE PELVIS 1-2 VIEWS COMPARISON:  None. FINDINGS: There is irregular lucency extending through the intertrochanteric region of the left hip, suspicious for possible acute nondisplaced fracture. Possible subtle cortical step-off at the lesser trochanter. Visualized femur otherwise intact. Visualized pelvis intact. No acute soft tissue abnormality. IMPRESSION: Irregular lucency extending through the intertrochanteric region of the left femur, suspicious for acute nondisplaced fracture. Correlation with physical exam recommended. If clinical picture is equivocal for acute fracture, further assessment with dedicated cross-sectional imaging may be helpful for further evaluation.  Electronically Signed   By: Jeannine Boga M.D.   On: 11/18/2017 06:30   Dg Chest Port 1 View  Result Date: 11/19/2017 CLINICAL DATA:  Elevated white blood cell count EXAM: PORTABLE CHEST 1 VIEW COMPARISON:  None. FINDINGS: The lungs are borderline hypoinflated. There is linear increased density in the right perihilar region. The left lung is clear. The heart and pulmonary vascularity are normal. The mediastinum is normal in width. The bony thorax exhibits no acute abnormality. IMPRESSION: Probable subsegmental atelectasis in the right perihilar region. No alveolar pneumonia, CHF, nor other acute cardiopulmonary abnormality. Electronically Signed   By: David  Martinique M.D.   On: 11/19/2017 10:49   Dg C-arm 1-60 Min-no Report  Result Date: 11/18/2017 Fluoroscopy was utilized by the requesting physician.  No radiographic interpretation.   Dg Hip Operative Unilat W Or W/o Pelvis Left  Result Date: 11/18/2017 CLINICAL DATA:  Intramedullary nail EXAM: OPERATIVE left HIP (WITH PELVIS IF PERFORMED) for VIEWS TECHNIQUE: Fluoroscopic spot image(s) were submitted for interpretation post-operatively. COMPARISON:  Earlier same day FINDINGS: Four images show intramedullary nail and compression screw reduction of an intertrochanteric fracture, with 2 distal locking screws. Alignment appears near anatomic. IMPRESSION: Good appearance following ORIF for intertrochanteric fracture. Electronically Signed   By: Nelson Chimes M.D.   On: 11/18/2017 13:33   Dg Femur Portable 1 View Left  Result Date: 11/18/2017 CLINICAL DATA:  Initial evaluation for acute trauma, fall. EXAM: LEFT FEMUR PORTABLE 1 VIEW COMPARISON:  None. FINDINGS: Proximal femur and hip not well assessed on this examination. Femur otherwise intact with no other acute fracture or dislocation. Degenerative changes noted about the knee. No acute soft tissue abnormality. IMPRESSION: 1. No acute osseous abnormality about the left femur. 2. Please note  that the proximal femur and hip not well assessed on this examination, better seen on concomitant radiograph of the pelvis. Electronically Signed   By: Jeannine Boga M.D.   On: 11/18/2017 06:28   Dg Femur Min 2 Views Left  Result Date: 11/20/2017 CLINICAL DATA:  Severe left femur pain. Recent open reduction internal fixation of intertrochanteric fracture of the proximal left femur. EXAM: LEFT FEMUR 2 VIEWS COMPARISON:  Radiographs dated 11/18/2017 FINDINGS: Intramedullary nail and proximal and distal locking screws appear in good position. There is minimal displacement of the intertrochanteric fracture of the proximal left femur. No acute abnormalities. IMPRESSION: Satisfactory appearance of the left hip after open reduction and internal fixation of intertrochanteric fracture. Electronically Signed   By: Lorriane Shire M.D.   On: 11/20/2017 10:03   Ct Maxillofacial Wo Contrast  Result Date: 11/18/2017 CLINICAL DATA:  Initial evaluation for acute trauma, fall. EXAM: CT HEAD WITHOUT CONTRAST CT MAXILLOFACIAL WITHOUT CONTRAST  CT CERVICAL SPINE WITHOUT CONTRAST TECHNIQUE: Multidetector CT imaging of the head, cervical spine, and maxillofacial structures were performed using the standard protocol without intravenous contrast. Multiplanar CT image reconstructions of the cervical spine and maxillofacial structures were also generated. COMPARISON:  None available. FINDINGS: CT HEAD FINDINGS Brain: Generalized age-related cerebral atrophy with mild chronic small vessel ischemic disease. No acute intracranial hemorrhage. No acute large vessel territory infarct. No mass lesion, midline shift or mass effect. No hydrocephalus. No extra-axial fluid collection. Vascular: No hyperdense vessel. Scattered vascular calcifications noted within the carotid siphons. Skull: Scalp soft tissues and calvarium within normal limits. Other: Mastoid air cells are clear. CT MAXILLOFACIAL FINDINGS Osseous: Zygomatic arches intact.  No acute maxillary fracture. Pterygoid plates intact. Nasal bones intact. Nasal septum relatively midline and intact. Visualized mandible intact. Mandibular condyles normally situated. No acute abnormality about the dentition. Orbits: Globes and orbital soft tissues within normal limits. Bony orbits intact. Sinuses: Maxillary sinuses nearly completely opacified, chronic in appearance. Paranasal sinuses are otherwise clear. Soft tissues: Soft tissue contusion/hematoma present at the left face. CT CERVICAL SPINE FINDINGS Alignment: Straightening of the normal cervical lordosis. No listhesis. Skull base and vertebrae: Skull base intact. Normal C1-2 articulations are preserved in the dens is intact. Vertebral body heights maintained. No acute fracture. Soft tissues and spinal canal: Paraspinous soft tissues within normal limits. No abnormal prevertebral edema. Disc levels: Mild to moderate cervical spondylolysis at C4-5 through C6-7. Upper chest: Visualized upper chest demonstrates no acute finding. Partially visualized lung apices are grossly clear. Other: None. IMPRESSION: 1. No acute intracranial abnormality. 2. Mild age-related cerebral atrophy with chronic small vessel ischemic disease. 3. Soft tissue contusion/hematoma at the left face. 4. No acute maxillofacial fracture. 5. Chronic maxillary sinusitis. 6. No acute traumatic injury within the cervical spine. 7. Mild to moderate cervical spondylolysis at C4-5through C6-7. Electronically Signed   By: Jeannine Boga M.D.   On: 11/18/2017 06:52    Microbiology: No results found for this or any previous visit (from the past 240 hour(s)).   Labs: Basic Metabolic Panel: Recent Labs  Lab 11/18/17 0605 11/19/17 0443 11/21/17 0416  NA 136 134* 136  K 3.7 4.7 3.9  CL 104 101 104  CO2 22 27 25   GLUCOSE 117* 134* 95  BUN 14 15 10   CREATININE 0.62 0.67 0.62  CALCIUM 8.9 8.5* 7.9*   Liver Function Tests: No results for input(s): AST, ALT, ALKPHOS,  BILITOT, PROT, ALBUMIN in the last 168 hours. No results for input(s): LIPASE, AMYLASE in the last 168 hours. No results for input(s): AMMONIA in the last 168 hours. CBC: Recent Labs  Lab 11/18/17 0605 11/19/17 0443 11/20/17 0438 11/21/17 0416  WBC 8.4 14.1* 12.0* 6.8  NEUTROABS 5.2  --   --   --   HGB 14.9 11.1* 10.9* 9.4*  HCT 44.8 34.0* 33.7* 29.7*  MCV 96.8 96.3 98.0 100.7*  PLT 291 257 215 188   Cardiac Enzymes: No results for input(s): CKTOTAL, CKMB, CKMBINDEX, TROPONINI in the last 168 hours. BNP: BNP (last 3 results) No results for input(s): BNP in the last 8760 hours.  ProBNP (last 3 results) No results for input(s): PROBNP in the last 8760 hours.  CBG: No results for input(s): GLUCAP in the last 168 hours.     Signed:  Alma Friendly, MD [facility] 11/21/2017, 12:02 PM

## 2017-11-21 NOTE — Care Management Important Message (Signed)
Important Message  Patient Details  Name: Holly Cochran MRN: [id number] Date of Birth: 1950-05-19   Medicare Important Message Given:  Yes    Kerin Salen 11/21/2017, 12:46 [place] Message  Patient Details  Name: Holly Cochran MRN: [id number] Date of Birth: 1950/11/26   Medicare Important Message Given:  Yes    Kerin Salen 11/21/2017, 12:46 PM

## 2017-11-21 NOTE — Progress Notes (Signed)
O2 saturation checked at rest and ambulating:  Rest before ambulation on RA- 94% Ambulation on RA- 96% Resting after ambulation on RA- 94%

## 2017-11-22 ENCOUNTER — Telehealth: Payer: Self-pay | Admitting: *Deleted

## 2017-11-22 NOTE — Telephone Encounter (Signed)
Transition Care Management Follow-up Telephone Call   Date discharged? 11/21/17   How have you been since you were released from the hospital? Pt states she is doing ok   Do you understand why you were in the hospital? YES   Do you understand the discharge instructions? YES   Where were you discharged to? YES   Items Reviewed:  Medications reviewed: YES  Allergies reviewed: YES  Dietary changes reviewed: YES  Referrals reviewed: Nom referral needed   Functional Questionnaire:   Activities of Daily Living (ADLs):   She states she are independent in the following: bathing and hygiene, feeding, continence, grooming, toileting and dressing States they require assistance with the following: ambulation   Any transportation issues/concerns?: NO   Any patient concerns? NO   Confirmed importance and date/time of follow-up visits scheduled YES, appt 11/28/17  Provider Appointment booked with Dr. Quay Burow  Confirmed with patient if condition begins to worsen call PCP or go to the ER.  Patient was given the office number and encouraged to call back with question or concerns.  : YES

## 2017-11-27 NOTE — Progress Notes (Signed)
Subjective:    Patient ID: Holly Cochran, female    DOB: 03-02-1950, 67 y.o.   MRN: [id number]  HPI The patient is here for follow up.  Admitted 11/18/17 - 11/21/17 for intertrochanteric fracture of the left femur.    She was trying to get out of bed but rolled off on her left side and fell.  She was not sleeping in her usual bed, which was likely part of the cause of her rolling off the bed.  She hit her left hip and left side of her head.  She had immediate severe pain in the left hip.  She had pain and bruises on the left side of the face/head.  There was no LOC.  She denied neck and back pain.  Imaging in the ED revealed an acute nondisplaced left sided intertrochanteric fracture of the femur.  Ortho was consulted.   Pain was controlled with pain medications.  She denied chest pain, SOB, abdominal pain and fever/chills.    Left intertrochanteric fracture of femur s/p intramedullary rod fixation on 11/18/17: Management per ortho Had DVT proph, discharged on aspirin 325 mg daily Follow up with ortho in 2 weeks-likely will be seen next week HH PT upon d/c-started and doing well Pain management and bowel regimen  Acute blood loss anemia, likely post surgery, hemodilution Baseline around 14, down trending slowly to 9.4, no signs of active bleeding Recheck labs  Acute hypoxic respiratory failure likely secondary to ? CAP vs atelectasis Afebrile, leukocyosis resolved On d/c saturating well on RA CXR w/ atelectasis S/p IV Rocephin and azithromycin, d/c'd home on augmentin x 5 days Encouraged incentive spirometry  Hypothyroidism Continued on synthroid  Hyperlipidemia On statin  Depression On lexapro  Hip fracture: She did well through surgery.  In the past week she has made great strides in her pain and function.  She is doing home physical therapy, which she feels is going well.  She is doing the exercises in between physical therapy.  Overall she feels her pain is  controlled.  She does take the pain medication at times.  She is taking the stool softener and denies any constipation.  She is taking the aspirin 325 mg daily.  Upon leaving the hospital she did have bilateral leg swelling in the right leg swelling has resolved and the left leg swelling has improved significantly.  She likely will be seeing orthopedics next week for follow-up.  Anemia: Thought to be related to acute blood loss from surgery and hemodilution.  Her hemoglobin did decrease during the hospital, but there is no signs of bleeding.  She denies any source of bleeding.  On occasion she does have some mild lightheadedness, but thinks it is more medication related.  She denies any chest pain or shortness of breath.  Acute hypoxic respiratory failure: This was thought to be related to either atelectasis or possible early pneumonia.  Her chest x-ray showed atelectasis.  She did complete the Augmentin she was sent home with.  She denies any fever, chills, cough, wheeze or shortness of breath.  She has not had any chest pain.  Hypothyroidism, hyperlipidemia, depression: She is taking her medication daily as prescribed.  She feels all of her medications are working well.  Medications and allergies reviewed with patient and updated if appropriate.  Patient Active Problem List   Diagnosis Date Noted  . Anemia 11/28/2017  . Acute respiratory failure with hypoxia ([place]) 11/28/2017  . Intertrochanteric fracture of left femur ([place]) 11/18/2017  .  Adjustment disorder with depressed mood 02/05/2017  . Vitamin D deficiency 08/31/2016  . Family history of diabetes mellitus in mother 01/30/2016  . Anxiety 03/02/2015  . Chronically dry eyes 07/20/2014  . Common peroneal neuropathy of left lower extremity 05/20/2014  . GERD (gastroesophageal reflux disease) 02/08/2011  . RHINITIS 11/24/2008  . GANGLION OF TENDON SHEATH 11/10/2008  . Hyperlipidemia 08/27/2007  . Osteopenia 08/27/2007  . Hypothyroidism,  postradioiodine therapy 06/01/2006    Current Outpatient Medications on File Prior to Visit  Medication Sig Dispense Refill  . aspirin EC 325 MG tablet Take 1 tablet (325 mg total) by mouth 2 (two) times daily after a meal. Take x 1 month post op to decrease risk of blood clots. 60 tablet 0  . Cholecalciferol (VITAMIN D3) 25 MCG (1000 UT) CAPS Take 1,000 Units by mouth daily.    Marland Kitchen escitalopram (LEXAPRO) 20 MG tablet Take 1 tablet (20 mg total) by mouth daily. 90 tablet 3  . fish oil-omega-3 fatty acids 1000 MG capsule Take 2 g by mouth daily.      . fluticasone (FLONASE) 50 MCG/ACT nasal spray USE TWO SPRAY IN EACH NOSTRIL EVERY DAY 16 g 5  . ibuprofen (ADVIL,MOTRIN) 200 MG tablet Take 400 mg by mouth every 6 (six) hours as needed for mild pain.    Marland Kitchen levothyroxine (SYNTHROID, LEVOTHROID) 125 MCG tablet TAKE 1 TABLET BY MOUTH ONCE DAILY EXCEPT  TAKE  1/2  TABLET  ON  TUESDAY  ,THURSDAY  AND  SATURDAY 90 tablet 2  . oxyCODONE-acetaminophen (PERCOCET/ROXICET) 5-325 MG tablet Take 1 tablet by mouth every 6 (six) hours as needed for severe pain. 40 tablet 0  . polyethylene glycol (MIRALAX / GLYCOLAX) packet Take 17 g by mouth 2 (two) times daily. 14 each 0  . pravastatin (PRAVACHOL) 40 MG tablet Take 1 tablet (40 mg total) by mouth at bedtime. 90 tablet 3  . ranitidine (ZANTAC) 150 MG tablet Take 1 tablet (150 mg total) by mouth 2 (two) times daily. 180 tablet 3  . senna-docusate (SENOKOT-S) 8.6-50 MG tablet Take 1 tablet by mouth 2 (two) times daily. 60 tablet 0  . tiZANidine (ZANAFLEX) 2 MG tablet Take 1 tablet (2 mg total) by mouth every 8 (eight) hours as needed for muscle spasms. 40 tablet 0   No current facility-administered medications on file prior to visit.     Past Medical History:  Diagnosis Date  . Common peroneal neuropathy of left lower extremity 05/20/2014  . FASCIITIS, PLANTAR 10/14/2007   Qualifier: Diagnosis of  By: Linna Darner MD, Gwyndolyn Saxon    . GERD (gastroesophageal reflux disease)    . Hyperlipidemia   . Osteopenia 2015   T score -1.1  . Thyroid disease    hyperthyroid-RA I    Past Surgical History:  Procedure Laterality Date  . COLONOSCOPY  2005    Dr Sharlett Iles, negative  . INTRAMEDULLARY (IM) NAIL INTERTROCHANTERIC Left 11/18/2017   Procedure: INTRAMEDULLARY (IM) NAIL INTERTROCHANTRIC;  Surgeon: Dorna Leitz, MD;  Location: [facility];  Service: Orthopedics;  Laterality: Left;  . PLANTAR FASCIA SURGERY  08/2010  . TONSILLECTOMY      Social History   Socioeconomic History  . Marital status: Single    Spouse name: Not on file  . Number of children: Not on file  . Years of education: Not on file  . Highest education level: Not on file  Occupational History  . Not on file  Social Needs  . Financial resource strain: Not on file  .  Food insecurity:    Worry: Not on file    Inability: Not on file  . Transportation needs:    Medical: Not on file    Non-medical: Not on file  Tobacco Use  . Smoking status: Never Smoker  . Smokeless tobacco: Never Used  Substance and Sexual Activity  . Alcohol use: Yes    Alcohol/week: 1.0 standard drinks    Types: 1 Standard drinks or equivalent per week    Comment: occasionally  . Drug use: No  . Sexual activity: Never    Birth control/protection: Abstinence, Post-menopausal    Comment: Virgin  Lifestyle  . Physical activity:    Days per week: Not on file    Minutes per session: Not on file  . Stress: Not on file  Relationships  . Social connections:    Talks on phone: Not on file    Gets together: Not on file    Attends religious service: Not on file    Active member of club or organization: Not on file    Attends meetings of clubs or organizations: Not on file    Relationship status: Not on file  Other Topics Concern  . Not on file  Social History Narrative  . Not on file    Family History  Problem Relation Age of Onset  . Diabetes Mother   . Kidney disease Mother   . Hypertension Mother   . Cancer  Father        Pancreatic  . Breast cancer Paternal Aunt        Age 50's  . Cancer Maternal Uncle        melanoma  . Diabetes Maternal Uncle   . Colon cancer Neg Hx   . Esophageal cancer Neg Hx   . Stomach cancer Neg Hx   . Rectal cancer Neg Hx     Review of Systems  Constitutional: Negative for chills and fever.  Respiratory: Negative for cough, shortness of breath and wheezing.   Cardiovascular: Positive for leg swelling (LLE > RLE). Negative for chest pain and palpitations.  Gastrointestinal: Negative for nausea.  Neurological: Positive for headaches (dull from sinuses). Negative for dizziness and light-headedness.       Objective:   Vitals:   11/28/17 1302  BP: (!) 144/82  Pulse: 83  Resp: 16  Temp: 98.6 F (37 C)  SpO2: 98%   BP Readings from Last 3 Encounters:  11/28/17 (!) 144/82  11/21/17 119/66  02/05/17 122/78   Wt Readings from Last 3 Encounters:  11/18/17 177 lb (80.3 kg)  02/05/17 175 lb (79.4 kg)  01/03/17 188 lb 9.6 oz (85.5 kg)   Body mass index is 29.45 kg/m.   Physical Exam    Constitutional: Appears well-developed and well-nourished. No distress.  HENT:  Head: Normocephalic and atraumatic.  Neck: Neck supple. No tracheal deviation present. No thyromegaly present.  No cervical lymphadenopathy Cardiovascular: Normal rate, regular rhythm and normal heart sounds.   No murmur heard. No carotid bruit .  No edema right lower extremity, mild edema left lower extremity-nonpitting Pulmonary/Chest: Effort normal and breath sounds normal. No respiratory distress. No has no wheezes. No rales.  Skin: Skin is warm and dry. Not diaphoretic.  Psychiatric: Normal mood and affect. Behavior is normal.    DG FEMUR MIN 2 VIEWS LEFT CLINICAL DATA:  Severe left femur pain. Recent open reduction internal fixation of intertrochanteric fracture of the proximal left femur.  EXAM: LEFT FEMUR 2 VIEWS  COMPARISON:  Radiographs  dated  11/18/2017  FINDINGS: Intramedullary nail and proximal and distal locking screws appear in good position. There is minimal displacement of the intertrochanteric fracture of the proximal left femur.  No acute abnormalities.  IMPRESSION: Satisfactory appearance of the left hip after open reduction and internal fixation of intertrochanteric fracture.  Electronically Signed   By: Lorriane Shire M.D.   On: 11/20/2017 10:03 DG Lumbar Spine 2-3 Views CLINICAL DATA:  Chronic right-sided back pain. Severe proximal left leg pain. Recent open reduction and internal fixation of intertrochanteric fracture of the proximal left femur.  EXAM: LUMBAR SPINE - 2-3 VIEW  COMPARISON:  None.  FINDINGS: There is no evidence of lumbar spine fracture. Alignment is normal. Intervertebral disc spaces are maintained.  IMPRESSION: Negative.  Electronically Signed   By: Lorriane Shire M.D.   On: 11/20/2017 10:00   Lab Results  Component Value Date   WBC 6.8 11/21/2017   HGB 9.4 (L) 11/21/2017   HCT 29.7 (L) 11/21/2017   PLT 188 11/21/2017   GLUCOSE 95 11/21/2017   CHOL 119 02/05/2017   TRIG 135.0 02/05/2017   HDL 30.60 (L) 02/05/2017   LDLCALC 61 02/05/2017   ALT 13 02/05/2017   AST 16 02/05/2017   NA 136 11/21/2017   K 3.9 11/21/2017   CL 104 11/21/2017   CREATININE 0.62 11/21/2017   BUN 10 11/21/2017   CO2 25 11/21/2017   TSH 3.32 02/05/2017   HGBA1C 5.6 02/05/2017   All blood work and imaging reviewed from the hospital.  Assessment & Plan:    See Problem List for Assessment and Plan of chronic medical problems.

## 2017-11-28 ENCOUNTER — Ambulatory Visit: Payer: Medicare Other | Admitting: Internal Medicine

## 2017-11-28 ENCOUNTER — Other Ambulatory Visit (INDEPENDENT_AMBULATORY_CARE_PROVIDER_SITE_OTHER): Payer: Medicare Other

## 2017-11-28 ENCOUNTER — Encounter: Payer: Self-pay | Admitting: Internal Medicine

## 2017-11-28 VITALS — BP 144/82 | HR 83 | Temp 98.6°F | Resp 16 | Ht 65.0 in

## 2017-11-28 DIAGNOSIS — S72145S Nondisplaced intertrochanteric fracture of left femur, sequela: Secondary | ICD-10-CM

## 2017-11-28 DIAGNOSIS — E890 Postprocedural hypothyroidism: Secondary | ICD-10-CM

## 2017-11-28 DIAGNOSIS — J9601 Acute respiratory failure with hypoxia: Secondary | ICD-10-CM | POA: Diagnosis not present

## 2017-11-28 DIAGNOSIS — D649 Anemia, unspecified: Secondary | ICD-10-CM | POA: Insufficient documentation

## 2017-11-28 DIAGNOSIS — Z23 Encounter for immunization: Secondary | ICD-10-CM

## 2017-11-28 DIAGNOSIS — E7849 Other hyperlipidemia: Secondary | ICD-10-CM

## 2017-11-28 LAB — CBC WITH DIFFERENTIAL/PLATELET
BASOS ABS: 0.1 10*3/uL (ref 0.0–0.1)
Basophils Relative: 1.3 % (ref 0.0–3.0)
EOS ABS: 0.2 10*3/uL (ref 0.0–0.7)
Eosinophils Relative: 2.3 % (ref 0.0–5.0)
HEMATOCRIT: 34 % — AB (ref 36.0–46.0)
Hemoglobin: 11.6 g/dL — ABNORMAL LOW (ref 12.0–15.0)
LYMPHS PCT: 21.3 % (ref 12.0–46.0)
Lymphs Abs: 2.1 10*3/uL (ref 0.7–4.0)
MCHC: 34.2 g/dL (ref 30.0–36.0)
MCV: 94.3 fl (ref 78.0–100.0)
Monocytes Absolute: 0.8 10*3/uL (ref 0.1–1.0)
Monocytes Relative: 8.3 % (ref 3.0–12.0)
NEUTROS ABS: 6.5 10*3/uL (ref 1.4–7.7)
NEUTROS PCT: 66.8 % (ref 43.0–77.0)
PLATELETS: 549 10*3/uL — AB (ref 150.0–400.0)
RBC: 3.6 Mil/uL — AB (ref 3.87–5.11)
RDW: 12.9 % (ref 11.5–15.5)
WBC: 9.8 10*3/uL (ref 4.0–10.5)

## 2017-11-28 LAB — COMPREHENSIVE METABOLIC PANEL
ALT: 15 U/L (ref 0–35)
AST: 19 U/L (ref 0–37)
Albumin: 4.2 g/dL (ref 3.5–5.2)
Alkaline Phosphatase: 82 U/L (ref 39–117)
BILIRUBIN TOTAL: 0.7 mg/dL (ref 0.2–1.2)
BUN: 19 mg/dL (ref 6–23)
CALCIUM: 9.3 mg/dL (ref 8.4–10.5)
CO2: 28 meq/L (ref 19–32)
CREATININE: 0.71 mg/dL (ref 0.40–1.20)
Chloride: 101 mEq/L (ref 96–112)
GFR: 87.51 mL/min (ref >60.00)
GLUCOSE: 91 mg/dL (ref 70–99)
Potassium: 4.2 mEq/L (ref 3.5–5.1)
Sodium: 135 mEq/L (ref 135–145)
Total Protein: 7.2 g/dL (ref 6.0–8.3)

## 2017-11-28 NOTE — Assessment & Plan Note (Addendum)
While in hospital - likely combination of acute blood loss from surgery and hemodilution No signs or symptoms of anemia Cbc, cmp today

## 2017-11-28 NOTE — Patient Instructions (Addendum)
  Tests ordered today. Your results will be released to [facility] (or called to you) after review, usually within 72hours after test completion. If any changes need to be made, you will be notified at that same time.  Flu immunization administered today.    Medications reviewed and updated.  Changes include :   none

## 2017-11-28 NOTE — Assessment & Plan Note (Signed)
Continue statin. 

## 2017-11-28 NOTE — Assessment & Plan Note (Signed)
--

## 2017-11-28 NOTE — Assessment & Plan Note (Signed)
S/p ORIF Doing well after surgery and has made great strides in the past week Doing home PT, doing the exercises in between PT Taking Percocet as needed for pain relief-overall pain controlled Taking Senokot, no constipation Taking tizanidine as needed for muscle spasms Taking aspirin 325 mg twice daily for DVT prophylaxis and she is trying to move around Continue above Has orthopedics follow-up next week

## 2017-11-28 NOTE — Assessment & Plan Note (Signed)
Occurred in the hospital-either related to atelectasis or pneumonia-treated with antibiotics, which she has completed Oxygen saturation 98% here today on room air No concerning respiratory symptoms No further evaluation of follow-up needed

## 2017-11-29 ENCOUNTER — Encounter: Payer: Self-pay | Admitting: Internal Medicine

## 2017-11-30 ENCOUNTER — Telehealth: Payer: Self-pay

## 2017-11-30 NOTE — Telephone Encounter (Signed)
Received call from pt stating that her stomach has been upset and she has had diarrhea and wonder what you would recommend her to take to help. Please advise.

## 2017-11-30 NOTE — Telephone Encounter (Signed)
Pt aware of response below.  

## 2017-11-30 NOTE — Telephone Encounter (Signed)
Ok to take immodium if needed.  Make sure she has stop the stool softeners.

## 2017-12-01 ENCOUNTER — Encounter: Payer: Self-pay | Admitting: Internal Medicine

## 2018-02-11 ENCOUNTER — Encounter: Payer: Self-pay | Admitting: Gynecology

## 2018-03-11 NOTE — Progress Notes (Signed)
Subjective:    Patient ID: Holly Cochran, female    DOB: Aug 06, 1950, 68 y.o.   MRN: [id number]  HPI The patient is here for follow up.  Left femur/hip fracture, muscle relaxer: She is still doing physical therapy for her left intertrochanteric fracture.  She does have a lot of muscle spasms and tightness and has taken the tizanidine and wonders if there is something that may help more.  She has not tried anything else.  She has a lot of stiffness in that area and in her right lower back because of the fracture and compensation.  Hyperlipidemia: She is taking her medication daily. She is compliant with a low fat/cholesterol diet. She is exercising some, but hopefully will be able to start exercising more she is still recovering from her hip fracture.   Hypothyroidism:  She is taking her medication daily.  She denies any recent changes in energy or weight that are unexplained.     Medications and allergies reviewed with patient and updated if appropriate.  Patient Active Problem List   Diagnosis Date Noted  . Anemia 11/28/2017  . Acute respiratory failure with hypoxia ([place]) 11/28/2017  . Intertrochanteric fracture of left femur ([place]) 11/18/2017  . Adjustment disorder with depressed mood 02/05/2017  . Vitamin D deficiency 08/31/2016  . Family history of diabetes mellitus in mother 01/30/2016  . Anxiety 03/02/2015  . Chronically dry eyes 07/20/2014  . Common peroneal neuropathy of left lower extremity 05/20/2014  . GERD (gastroesophageal reflux disease) 02/08/2011  . RHINITIS 11/24/2008  . GANGLION OF TENDON SHEATH 11/10/2008  . Hyperlipidemia 08/27/2007  . Osteopenia 08/27/2007  . Hypothyroidism, postradioiodine therapy 06/01/2006    Current Outpatient Medications on File Prior to Visit  Medication Sig Dispense Refill  . Cholecalciferol (VITAMIN D3) 25 MCG (1000 UT) CAPS Take 1,000 Units by mouth daily.    . fish oil-omega-3 fatty acids 1000 MG capsule Take 2 g by mouth  daily.      . ranitidine (ZANTAC) 150 MG tablet Take 1 tablet (150 mg total) by mouth 2 (two) times daily. 180 tablet 3   No current facility-administered medications on file prior to visit.     Past Medical History:  Diagnosis Date  . Common peroneal neuropathy of left lower extremity 05/20/2014  . FASCIITIS, PLANTAR 10/14/2007   Qualifier: Diagnosis of  By: Linna Darner MD, Gwyndolyn Saxon    . GERD (gastroesophageal reflux disease)   . Hyperlipidemia   . Osteopenia 2015   T score -1.1  . Thyroid disease    hyperthyroid-RA I    Past Surgical History:  Procedure Laterality Date  . COLONOSCOPY  2005    Dr Sharlett Iles, negative  . INTRAMEDULLARY (IM) NAIL INTERTROCHANTERIC Left 11/18/2017   Procedure: INTRAMEDULLARY (IM) NAIL INTERTROCHANTRIC;  Surgeon: Dorna Leitz, MD;  Location: [facility];  Service: Orthopedics;  Laterality: Left;  . PLANTAR FASCIA SURGERY  08/2010  . TONSILLECTOMY      Social History   Socioeconomic History  . Marital status: Single    Spouse name: Not on file  . Number of children: Not on file  . Years of education: Not on file  . Highest education level: Not on file  Occupational History  . Not on file  Social Needs  . Financial resource strain: Not on file  . Food insecurity:    Worry: Not on file    Inability: Not on file  . Transportation needs:    Medical: Not on file    Non-medical:  Not on file  Tobacco Use  . Smoking status: Never Smoker  . Smokeless tobacco: Never Used  Substance and Sexual Activity  . Alcohol use: Yes    Alcohol/week: 1.0 standard drinks    Types: 1 Standard drinks or equivalent per week    Comment: occasionally  . Drug use: No  . Sexual activity: Never    Birth control/protection: Abstinence, Post-menopausal    Comment: Virgin  Lifestyle  . Physical activity:    Days per week: Not on file    Minutes per session: Not on file  . Stress: Not on file  Relationships  . Social connections:    Talks on phone: Not on file    Gets  together: Not on file    Attends religious service: Not on file    Active member of club or organization: Not on file    Attends meetings of clubs or organizations: Not on file    Relationship status: Not on file  Other Topics Concern  . Not on file  Social History Narrative  . Not on file    Family History  Problem Relation Age of Onset  . Diabetes Mother   . Kidney disease Mother   . Hypertension Mother   . Cancer Father        Pancreatic  . Breast cancer Paternal Aunt        Age 64's  . Cancer Maternal Uncle        melanoma  . Diabetes Maternal Uncle   . Colon cancer Neg Hx   . Esophageal cancer Neg Hx   . Stomach cancer Neg Hx   . Rectal cancer Neg Hx     Review of Systems  Constitutional: Negative for chills and fever.  Respiratory: Negative for cough, shortness of breath and wheezing.   Cardiovascular: Negative for chest pain, palpitations and leg swelling.  Neurological: Negative for light-headedness and headaches.       Objective:   Vitals:   03/12/18 1114  BP: 122/72  Pulse: 89  Resp: 16  Temp: 98.8 F (37.1 C)  SpO2: 95%   BP Readings from Last 3 Encounters:  03/12/18 122/72  11/28/17 (!) 144/82  11/21/17 119/66   Wt Readings from Last 3 Encounters:  03/12/18 183 lb 12.8 oz (83.4 kg)  11/18/17 177 lb (80.3 kg)  02/05/17 175 lb (79.4 kg)   Body mass index is 30.59 kg/m.   Physical Exam    Constitutional: Appears well-developed and well-nourished. No distress.  HENT:  Head: Normocephalic and atraumatic.  Neck: Neck supple. No tracheal deviation present. No thyromegaly present.  No cervical lymphadenopathy Cardiovascular: Normal rate, regular rhythm and normal heart sounds.   No murmur heard. No carotid bruit .  No edema Pulmonary/Chest: Effort normal and breath sounds normal. No respiratory distress. No has no wheezes. No rales.  Skin: Skin is warm and dry. Not diaphoretic.  Psychiatric: Normal mood and affect. Behavior is normal.       Assessment & Plan:    See Problem List for Assessment and Plan of chronic medical problems.

## 2018-03-12 ENCOUNTER — Ambulatory Visit: Payer: Medicare Other | Admitting: Internal Medicine

## 2018-03-12 ENCOUNTER — Encounter: Payer: Self-pay | Admitting: Internal Medicine

## 2018-03-12 VITALS — BP 122/72 | HR 89 | Temp 98.8°F | Resp 16 | Ht 65.0 in | Wt 183.8 lb

## 2018-03-12 DIAGNOSIS — K219 Gastro-esophageal reflux disease without esophagitis: Secondary | ICD-10-CM

## 2018-03-12 DIAGNOSIS — F419 Anxiety disorder, unspecified: Secondary | ICD-10-CM | POA: Diagnosis not present

## 2018-03-12 DIAGNOSIS — E7849 Other hyperlipidemia: Secondary | ICD-10-CM | POA: Diagnosis not present

## 2018-03-12 DIAGNOSIS — E890 Postprocedural hypothyroidism: Secondary | ICD-10-CM | POA: Diagnosis not present

## 2018-03-12 DIAGNOSIS — S72145S Nondisplaced intertrochanteric fracture of left femur, sequela: Secondary | ICD-10-CM

## 2018-03-12 DIAGNOSIS — Z833 Family history of diabetes mellitus: Secondary | ICD-10-CM

## 2018-03-12 DIAGNOSIS — E7800 Pure hypercholesterolemia, unspecified: Secondary | ICD-10-CM

## 2018-03-12 MED ORDER — PRAVASTATIN SODIUM 40 MG PO TABS: 40.0000 mg | ORAL_TABLET | Freq: Every day | ORAL | 3 refills | Status: DC

## 2018-03-12 MED ORDER — LEVOTHYROXINE SODIUM 125 MCG PO TABS: ORAL_TABLET | ORAL | 2 refills | Status: DC

## 2018-03-12 MED ORDER — ESCITALOPRAM OXALATE 20 MG PO TABS: 20.0000 mg | ORAL_TABLET | Freq: Every day | ORAL | 3 refills | Status: DC

## 2018-03-12 MED ORDER — METHOCARBAMOL 500 MG PO TABS: 500.0000 mg | ORAL_TABLET | Freq: Four times a day (QID) | ORAL | 3 refills | Status: DC | PRN

## 2018-03-12 NOTE — Assessment & Plan Note (Signed)
Check lipid panel  Continue daily statin Regular exercise and healthy diet encouraged  

## 2018-03-12 NOTE — Patient Instructions (Addendum)
  Tests ordered today. Your results will be released to [facility] (or called to you) after review, usually within 72hours after test completion. If any changes need to be made, you will be notified at that same time.   Medications reviewed and updated.  Changes include :   Try the methocarbamol for the muscles.   Your prescription(s) have been submitted to your pharmacy. Please take as directed and contact our office if you believe you are having problem(s) with the medication(s).

## 2018-03-12 NOTE — Assessment & Plan Note (Signed)
Healing well overall, but still recovering Still doing physical therapy Has a lot of muscle spasms and stiffness Has been taking tizanidine, which helps some, but wonders if there is something else that may help Can try methocarbamol

## 2018-03-12 NOTE — Assessment & Plan Note (Signed)
Clinically euthyroid Check tsh  Titrate med dose if needed  

## 2018-03-12 NOTE — Assessment & Plan Note (Signed)
Has had slightly elevated sugars in the past Check A1c She will start to increase her activity as her hip continues to heal will be working on weight loss as well

## 2018-03-12 NOTE — Assessment & Plan Note (Signed)
Controlled, stable Continue current dose of medication  

## 2018-03-12 NOTE — Assessment & Plan Note (Signed)
Controlled with ranitidine-discussed recall Has tried Pepcid in the past and it was not effective We will continue ranitidine-can change to omeprazole if needed

## 2018-03-13 ENCOUNTER — Other Ambulatory Visit: Payer: Self-pay

## 2018-03-13 ENCOUNTER — Other Ambulatory Visit (INDEPENDENT_AMBULATORY_CARE_PROVIDER_SITE_OTHER): Payer: Medicare Other

## 2018-03-13 DIAGNOSIS — E890 Postprocedural hypothyroidism: Secondary | ICD-10-CM | POA: Diagnosis not present

## 2018-03-13 DIAGNOSIS — Z833 Family history of diabetes mellitus: Secondary | ICD-10-CM | POA: Diagnosis not present

## 2018-03-13 DIAGNOSIS — E7849 Other hyperlipidemia: Secondary | ICD-10-CM

## 2018-03-13 DIAGNOSIS — E7800 Pure hypercholesterolemia, unspecified: Secondary | ICD-10-CM | POA: Diagnosis not present

## 2018-03-13 LAB — COMPREHENSIVE METABOLIC PANEL
ALBUMIN: 4.4 g/dL (ref 3.5–5.2)
ALT: 18 U/L (ref 0–35)
AST: 19 U/L (ref 0–37)
Alkaline Phosphatase: 89 U/L (ref 39–117)
BUN: 12 mg/dL (ref 6–23)
CO2: 29 mEq/L (ref 19–32)
Calcium: 9.3 mg/dL (ref 8.4–10.5)
Chloride: 105 mEq/L (ref 96–112)
Creatinine, Ser: 0.69 mg/dL (ref 0.40–1.20)
GFR: 85.02 mL/min (ref >60.00)
Glucose, Bld: 89 mg/dL (ref 70–99)
Potassium: 4.3 mEq/L (ref 3.5–5.1)
Sodium: 140 mEq/L (ref 135–145)
Total Bilirubin: 0.6 mg/dL (ref 0.2–1.2)
Total Protein: 7.1 g/dL (ref 6.0–8.3)

## 2018-03-13 LAB — LIPID PANEL
Cholesterol: 121 mg/dL (ref 0–200)
HDL: 31 mg/dL — ABNORMAL LOW (ref >39.00)
LDL CALC: 54 mg/dL (ref 0–99)
NonHDL: 89.9
Total CHOL/HDL Ratio: 4
Triglycerides: 181 mg/dL — ABNORMAL HIGH (ref 0.0–149.0)
VLDL: 36.2 mg/dL (ref 0.0–40.0)

## 2018-03-13 LAB — CBC WITH DIFFERENTIAL/PLATELET
Basophils Absolute: 0 10*3/uL (ref 0.0–0.1)
Basophils Relative: 0.7 % (ref 0.0–3.0)
Eosinophils Absolute: 0.1 10*3/uL (ref 0.0–0.7)
Eosinophils Relative: 1.9 % (ref 0.0–5.0)
HCT: 42.2 % (ref 36.0–46.0)
Hemoglobin: 14.3 g/dL (ref 12.0–15.0)
Lymphocytes Relative: 32.7 % (ref 12.0–46.0)
Lymphs Abs: 1.7 10*3/uL (ref 0.7–4.0)
MCHC: 33.8 g/dL (ref 30.0–36.0)
MCV: 91.2 fl (ref 78.0–100.0)
MONOS PCT: 9 % (ref 3.0–12.0)
Monocytes Absolute: 0.5 10*3/uL (ref 0.1–1.0)
Neutro Abs: 2.8 10*3/uL (ref 1.4–7.7)
Neutrophils Relative %: 55.7 % (ref 43.0–77.0)
Platelets: 319 10*3/uL (ref 150.0–400.0)
RBC: 4.63 Mil/uL (ref 3.87–5.11)
RDW: 13.3 % (ref 11.5–15.5)
WBC: 5.1 10*3/uL (ref 4.0–10.5)

## 2018-03-13 LAB — HEMOGLOBIN A1C: Hgb A1c MFr Bld: 5.8 % (ref 4.6–6.5)

## 2018-03-13 LAB — TSH: TSH: 5.62 u[IU]/mL — ABNORMAL HIGH (ref 0.35–4.50)

## 2018-03-13 MED ORDER — RANITIDINE HCL 150 MG PO TABS: 150.0000 mg | ORAL_TABLET | Freq: Two times a day (BID) | ORAL | 1 refills | Status: DC

## 2018-03-16 ENCOUNTER — Other Ambulatory Visit: Payer: Self-pay | Admitting: Internal Medicine

## 2018-03-16 ENCOUNTER — Encounter: Payer: Self-pay | Admitting: Internal Medicine

## 2018-03-16 DIAGNOSIS — E890 Postprocedural hypothyroidism: Secondary | ICD-10-CM

## 2018-03-16 MED ORDER — LEVOTHYROXINE SODIUM 125 MCG PO TABS: ORAL_TABLET | ORAL | 2 refills | Status: DC

## 2018-04-11 ENCOUNTER — Encounter: Payer: Self-pay | Admitting: Internal Medicine

## 2018-04-12 MED ORDER — OMEPRAZOLE 40 MG PO CPDR: 40.0000 mg | DELAYED_RELEASE_CAPSULE | Freq: Every day | ORAL | 1 refills | Status: DC

## 2018-04-12 NOTE — Addendum Note (Signed)
Addended by: Binnie Rail on: 04/12/2018 12:49 PM   Modules accepted: Orders

## 2018-07-02 ENCOUNTER — Other Ambulatory Visit: Payer: Self-pay | Admitting: Internal Medicine

## 2018-09-05 ENCOUNTER — Encounter: Payer: Self-pay | Admitting: Internal Medicine

## 2018-09-05 DIAGNOSIS — M85861 Other specified disorders of bone density and structure, right lower leg: Secondary | ICD-10-CM

## 2018-09-05 DIAGNOSIS — M85862 Other specified disorders of bone density and structure, left lower leg: Secondary | ICD-10-CM

## 2018-09-05 NOTE — Telephone Encounter (Signed)
Please schedule dexa - ordered

## 2018-09-17 ENCOUNTER — Other Ambulatory Visit: Payer: Self-pay | Admitting: *Deleted

## 2018-09-17 DIAGNOSIS — Z20822 Contact with and (suspected) exposure to covid-19: Secondary | ICD-10-CM

## 2018-09-18 ENCOUNTER — Inpatient Hospital Stay: Admission: RE | Admit: 2018-09-18 | Payer: Medicare Other | Source: Ambulatory Visit

## 2018-09-19 LAB — NOVEL CORONAVIRUS, NAA: SARS-CoV-2, NAA: NOT DETECTED

## 2018-09-20 ENCOUNTER — Ambulatory Visit (INDEPENDENT_AMBULATORY_CARE_PROVIDER_SITE_OTHER)
Admission: RE | Admit: 2018-09-20 | Discharge: 2018-09-20 | Disposition: A | Discharge status: Home / Self Care | Payer: Medicare Other | Source: Ambulatory Visit | Attending: Internal Medicine | Admitting: Internal Medicine

## 2018-09-20 ENCOUNTER — Other Ambulatory Visit: Payer: Self-pay

## 2018-09-20 DIAGNOSIS — M85861 Other specified disorders of bone density and structure, right lower leg: Secondary | ICD-10-CM

## 2018-09-20 DIAGNOSIS — M85862 Other specified disorders of bone density and structure, left lower leg: Secondary | ICD-10-CM

## 2018-09-26 ENCOUNTER — Encounter: Payer: Self-pay | Admitting: Internal Medicine

## 2018-10-02 ENCOUNTER — Other Ambulatory Visit: Payer: Self-pay | Admitting: Internal Medicine

## 2018-10-16 ENCOUNTER — Encounter: Payer: Self-pay | Admitting: Gynecology

## 2018-10-22 ENCOUNTER — Other Ambulatory Visit: Payer: Self-pay

## 2018-10-22 DIAGNOSIS — Z20822 Contact with and (suspected) exposure to covid-19: Secondary | ICD-10-CM

## 2018-10-23 LAB — NOVEL CORONAVIRUS, NAA: SARS-CoV-2, NAA: NOT DETECTED

## 2018-11-14 ENCOUNTER — Other Ambulatory Visit: Payer: Self-pay | Admitting: Internal Medicine

## 2018-11-14 DIAGNOSIS — E890 Postprocedural hypothyroidism: Secondary | ICD-10-CM

## 2018-12-16 ENCOUNTER — Other Ambulatory Visit: Payer: Self-pay | Admitting: Internal Medicine

## 2018-12-30 ENCOUNTER — Ambulatory Visit: Payer: Medicare Other | Attending: Internal Medicine

## 2018-12-30 DIAGNOSIS — Z20822 Contact with and (suspected) exposure to covid-19: Secondary | ICD-10-CM

## 2018-12-31 ENCOUNTER — Other Ambulatory Visit: Payer: Medicare Other

## 2018-12-31 LAB — NOVEL CORONAVIRUS, NAA: SARS-CoV-2, NAA: NOT DETECTED

## 2019-01-05 ENCOUNTER — Other Ambulatory Visit: Payer: Self-pay | Admitting: Internal Medicine

## 2019-01-30 ENCOUNTER — Ambulatory Visit: Payer: Medicare Other | Attending: Internal Medicine

## 2019-01-30 DIAGNOSIS — Z23 Encounter for immunization: Secondary | ICD-10-CM | POA: Insufficient documentation

## 2019-01-30 NOTE — Progress Notes (Signed)
   Covid-19 Vaccination Clinic  Name:  Holly Cochran    MRN: [id number] DOB: Feb 11, 1950  01/30/2019  Holly Cochran was observed post Covid-19 immunization for 15 minutes without incidence. She was provided with Vaccine Information Sheet and instruction to access the V-Safe system.   Holly Cochran was instructed to call 911 with any severe reactions post vaccine: Marland Kitchen Difficulty breathing  . Swelling of your face and throat  . A fast heartbeat  . A bad rash all over your body  . Dizziness and weakness    Immunizations Administered    Name Date Dose VIS Date Route   Pfizer COVID-19 Vaccine 01/30/2019 10:07 AM 0.3 mL 12/20/2018 Intramuscular   Manufacturer: [place]   Lot: [id number]   [place]: [id number]

## 2019-02-20 ENCOUNTER — Ambulatory Visit: Payer: Medicare PPO | Attending: Internal Medicine

## 2019-02-20 DIAGNOSIS — Z23 Encounter for immunization: Secondary | ICD-10-CM | POA: Insufficient documentation

## 2019-02-20 NOTE — Progress Notes (Signed)
   Covid-19 Vaccination Clinic  Name:  Holly Cochran    MRN: [id number] DOB: May 18, 1950  02/20/2019  Holly Cochran was observed post Covid-19 immunization for 15 minutes without incidence. She was provided with Vaccine Information Sheet and instruction to access the V-Safe system.   Holly Cochran was instructed to call 911 with any severe reactions post vaccine: Marland Kitchen Difficulty breathing  . Swelling of your face and throat  . A fast heartbeat  . A bad rash all over your body  . Dizziness and weakness    Immunizations Administered    Name Date Dose VIS Date Route   Pfizer COVID-19 Vaccine 02/20/2019 10:28 AM 0.3 mL 12/20/2018 Intramuscular   Manufacturer: [place]   Lot: [id number]   [place]: [id number]

## 2019-02-28 DIAGNOSIS — S52124A Nondisplaced fracture of head of right radius, initial encounter for closed fracture: Secondary | ICD-10-CM | POA: Diagnosis not present

## 2019-02-28 DIAGNOSIS — M79601 Pain in right arm: Secondary | ICD-10-CM | POA: Diagnosis not present

## 2019-02-28 DIAGNOSIS — S0181XA Laceration without foreign body of other part of head, initial encounter: Secondary | ICD-10-CM | POA: Diagnosis not present

## 2019-02-28 DIAGNOSIS — M79602 Pain in left arm: Secondary | ICD-10-CM | POA: Diagnosis not present

## 2019-03-07 DIAGNOSIS — S52125A Nondisplaced fracture of head of left radius, initial encounter for closed fracture: Secondary | ICD-10-CM | POA: Diagnosis not present

## 2019-03-12 DIAGNOSIS — M9901 Segmental and somatic dysfunction of cervical region: Secondary | ICD-10-CM | POA: Diagnosis not present

## 2019-03-12 DIAGNOSIS — S138XXA Sprain of joints and ligaments of other parts of neck, initial encounter: Secondary | ICD-10-CM | POA: Diagnosis not present

## 2019-03-14 ENCOUNTER — Telehealth: Payer: Self-pay

## 2019-03-14 ENCOUNTER — Ambulatory Visit: Payer: Medicare PPO | Admitting: Internal Medicine

## 2019-03-14 ENCOUNTER — Encounter: Payer: Self-pay | Admitting: Internal Medicine

## 2019-03-14 ENCOUNTER — Other Ambulatory Visit: Payer: Self-pay

## 2019-03-14 VITALS — BP 150/92 | HR 72 | Temp 98.2°F | Resp 16 | Ht 65.0 in | Wt 186.0 lb

## 2019-03-14 DIAGNOSIS — M85861 Other specified disorders of bone density and structure, right lower leg: Secondary | ICD-10-CM

## 2019-03-14 DIAGNOSIS — E7849 Other hyperlipidemia: Secondary | ICD-10-CM

## 2019-03-14 DIAGNOSIS — I10 Essential (primary) hypertension: Secondary | ICD-10-CM

## 2019-03-14 DIAGNOSIS — E559 Vitamin D deficiency, unspecified: Secondary | ICD-10-CM

## 2019-03-14 DIAGNOSIS — E890 Postprocedural hypothyroidism: Secondary | ICD-10-CM

## 2019-03-14 DIAGNOSIS — F4321 Adjustment disorder with depressed mood: Secondary | ICD-10-CM

## 2019-03-14 DIAGNOSIS — M85862 Other specified disorders of bone density and structure, left lower leg: Secondary | ICD-10-CM

## 2019-03-14 MED ORDER — LOSARTAN POTASSIUM 25 MG PO TABS: 25.0000 mg | ORAL_TABLET | Freq: Every day | ORAL | 1 refills | Status: DC

## 2019-03-14 NOTE — Telephone Encounter (Signed)
Blood work ordered-please schedule

## 2019-03-14 NOTE — Progress Notes (Signed)
Subjective:    Patient ID: Holly Cochran, female    DOB: 19-Apr-1950, 69 y.o.   MRN: [id number]  HPI The patient is here for follow up of her BP which has been slightly elevated.   BP today at home 141/86.   Two weeks ago she fell - caught left toe.  Broke b/l elbows and bruised face.  Went to chiropractor  Monday for stiff neck and BP was 152/85.  This morning it was 141/86.  She denies cest pain, palps, sob, headaches and dizziness.     She has been taking more advil than normal for elbow pain.  She denies change in salt intake, meds or supplements.      Medications and allergies reviewed with patient and updated if appropriate.  Patient Active Problem List   Diagnosis Date Noted  . Essential hypertension 03/15/2019  . Anemia 11/28/2017  . Acute respiratory failure with hypoxia ([place]) 11/28/2017  . Intertrochanteric fracture of left femur ([place]) 11/18/2017  . Adjustment disorder with depressed mood 02/05/2017  . Vitamin D deficiency 08/31/2016  . Family history of diabetes mellitus in mother 01/30/2016  . Anxiety 03/02/2015  . Chronically dry eyes 07/20/2014  . Common peroneal neuropathy of left lower extremity 05/20/2014  . GERD (gastroesophageal reflux disease) 02/08/2011  . RHINITIS 11/24/2008  . GANGLION OF TENDON SHEATH 11/10/2008  . Hyperlipidemia 08/27/2007  . Osteopenia 08/27/2007  . Hypothyroidism, postradioiodine therapy 06/01/2006    Current Outpatient Medications on File Prior to Visit  Medication Sig Dispense Refill  . Cholecalciferol (VITAMIN D3) 25 MCG (1000 UT) CAPS Take 1,000 Units by mouth daily.    Marland Kitchen escitalopram (LEXAPRO) 20 MG tablet TAKE 1 TABLET ONCE DAILY. 90 tablet 0  . fish oil-omega-3 fatty acids 1000 MG capsule Take 2 g by mouth daily.      Marland Kitchen levothyroxine (SYNTHROID) 125 MCG tablet TAKE 1 TABLET ONCE DAILY EXCEPT TAKE 1/2 TABLET ON TUESDAY, THURSDAY AND SATURDAY. 70 tablet 1  . methocarbamol (ROBAXIN) 500 MG tablet Take 1 tablet (500 mg  total) by mouth every 6 (six) hours as needed for muscle spasms. 40 tablet 3  . omeprazole (PRILOSEC) 40 MG capsule TAKE 1 CAPSULE ONCE DAILY 30 MINUTES BEFORE A MEAL. 90 capsule 0  . pravastatin (PRAVACHOL) 40 MG tablet Take 1 tablet (40 mg total) by mouth at bedtime. 90 tablet 3   No current facility-administered medications on file prior to visit.    Past Medical History:  Diagnosis Date  . Common peroneal neuropathy of left lower extremity 05/20/2014  . FASCIITIS, PLANTAR 10/14/2007   Qualifier: Diagnosis of  By: Linna Darner MD, Gwyndolyn Saxon    . GERD (gastroesophageal reflux disease)   . Hyperlipidemia   . Osteopenia 2015   T score -1.1  . Thyroid disease    hyperthyroid-RA I    Past Surgical History:  Procedure Laterality Date  . COLONOSCOPY  2005    Dr Sharlett Iles, negative  . INTRAMEDULLARY (IM) NAIL INTERTROCHANTERIC Left 11/18/2017   Procedure: INTRAMEDULLARY (IM) NAIL INTERTROCHANTRIC;  Surgeon: Dorna Leitz, MD;  Location: [facility];  Service: Orthopedics;  Laterality: Left;  . PLANTAR FASCIA SURGERY  08/2010  . TONSILLECTOMY      Social History   Socioeconomic History  . Marital status: Single    Spouse name: Not on file  . Number of children: Not on file  . Years of education: Not on file  . Highest education level: Not on file  Occupational History  . Not on file  Tobacco Use  . Smoking status: Never Smoker  . Smokeless tobacco: Never Used  Substance and Sexual Activity  . Alcohol use: Yes    Alcohol/week: 1.0 standard drinks    Types: 1 Standard drinks or equivalent per week    Comment: occasionally  . Drug use: No  . Sexual activity: Never    Birth control/protection: Abstinence, Post-menopausal    Comment: Virgin  Other Topics Concern  . Not on file  Social History Narrative  . Not on file   Social Determinants of Health   Financial Resource Strain:   . Difficulty of Paying Living Expenses: Not on file  Food Insecurity:   . Worried About Sales executive in the Last Year: Not on file  . Ran Out of Food in the Last Year: Not on file  Transportation Needs:   . Lack of Transportation (Medical): Not on file  . Lack of Transportation (Non-Medical): Not on file  Physical Activity:   . Days of Exercise per Week: Not on file  . Minutes of Exercise per Session: Not on file  Stress:   . Feeling of Stress : Not on file  Social Connections:   . Frequency of Communication with Friends and Family: Not on file  . Frequency of Social Gatherings with Friends and Family: Not on file  . Attends Religious Services: Not on file  . Active Member of Clubs or Organizations: Not on file  . Attends Archivist Meetings: Not on file  . Marital Status: Not on file    Family History  Problem Relation Age of Onset  . Diabetes Mother   . Kidney disease Mother   . Hypertension Mother   . Cancer Father        Pancreatic  . Breast cancer Paternal Aunt        Age 31's  . Cancer Maternal Uncle        melanoma  . Diabetes Maternal Uncle   . Colon cancer Neg Hx   . Esophageal cancer Neg Hx   . Stomach cancer Neg Hx   . Rectal cancer Neg Hx     Review of Systems  Constitutional: Negative for chills and fever.  Respiratory: Negative for cough, shortness of breath and wheezing.   Cardiovascular: Negative for chest pain, palpitations and leg swelling.  Neurological: Negative for light-headedness and headaches.       Objective:   Vitals:   03/14/19 1450  BP: (!) 150/92  Pulse: 72  Resp: 16  Temp: 98.2 F (36.8 C)  SpO2: 98%   BP Readings from Last 3 Encounters:  03/14/19 (!) 150/92  03/12/18 122/72  11/28/17 (!) 144/82   Wt Readings from Last 3 Encounters:  03/14/19 186 lb (84.4 kg)  03/12/18 183 lb 12.8 oz (83.4 kg)  11/18/17 177 lb (80.3 kg)   Body mass index is 30.95 kg/m.   Physical Exam    Constitutional: Appears well-developed and well-nourished. No distress.  HENT:  Head: Normocephalic and atraumatic.  Neck: Neck  supple. No tracheal deviation present. No thyromegaly present.  No cervical lymphadenopathy Cardiovascular: Normal rate, regular rhythm and normal heart sounds.   No murmur heard. No carotid bruit .  No edema Pulmonary/Chest: Effort normal and breath sounds normal. No respiratory distress. No has no wheezes. No rales.  Skin: Skin is warm and dry. Not diaphoretic.  Psychiatric: Normal mood and affect. Behavior is normal.      Assessment & Plan:    See Problem  List for Assessment and Plan of chronic medical problems.    This visit occurred during the SARS-CoV-2 public health emergency.  Safety protocols were in place, including screening questions prior to the visit, additional usage of staff PPE, and extensive cleaning of exam room while observing appropriate contact time as indicated for disinfecting solutions.

## 2019-03-14 NOTE — Telephone Encounter (Signed)
Patient would like to know if CPE lab orders could be placed before her CPE next month. Please advise.

## 2019-03-14 NOTE — Patient Instructions (Signed)
  Medications reviewed and updated.  Changes include :  Losartan 25mg  daily   Your prescription(s) have been submitted to your pharmacy. Please take as directed and contact our office if you believe you are having problem(s) with the medication(s).  Monitor your BP at home   Please followup in 1 month

## 2019-03-15 DIAGNOSIS — I10 Essential (primary) hypertension: Secondary | ICD-10-CM | POA: Insufficient documentation

## 2019-03-15 NOTE — Assessment & Plan Note (Signed)
New BP elevated on several occasions, asymptomatic Start losartan 25 mg daily Monitor BP at home Increase exercise Continue low sodium diet  Follow up in one month for CPE/labs

## 2019-03-17 DIAGNOSIS — S138XXA Sprain of joints and ligaments of other parts of neck, initial encounter: Secondary | ICD-10-CM | POA: Diagnosis not present

## 2019-03-17 DIAGNOSIS — M9901 Segmental and somatic dysfunction of cervical region: Secondary | ICD-10-CM | POA: Diagnosis not present

## 2019-03-17 NOTE — Telephone Encounter (Signed)
Appointment scheduled. Pt wanted me to let you know her BP was 122/78 this morning. She states that he BP varies but it is not usually as high as it was in the office. I told her I would forward the message.

## 2019-03-18 DIAGNOSIS — S138XXA Sprain of joints and ligaments of other parts of neck, initial encounter: Secondary | ICD-10-CM | POA: Diagnosis not present

## 2019-03-18 DIAGNOSIS — M9901 Segmental and somatic dysfunction of cervical region: Secondary | ICD-10-CM | POA: Diagnosis not present

## 2019-03-19 DIAGNOSIS — M9901 Segmental and somatic dysfunction of cervical region: Secondary | ICD-10-CM | POA: Diagnosis not present

## 2019-03-19 DIAGNOSIS — S138XXA Sprain of joints and ligaments of other parts of neck, initial encounter: Secondary | ICD-10-CM | POA: Diagnosis not present

## 2019-03-21 ENCOUNTER — Other Ambulatory Visit: Payer: Self-pay | Admitting: Internal Medicine

## 2019-03-21 DIAGNOSIS — S52125D Nondisplaced fracture of head of left radius, subsequent encounter for closed fracture with routine healing: Secondary | ICD-10-CM | POA: Diagnosis not present

## 2019-03-21 DIAGNOSIS — S52124D Nondisplaced fracture of head of right radius, subsequent encounter for closed fracture with routine healing: Secondary | ICD-10-CM | POA: Diagnosis not present

## 2019-03-24 DIAGNOSIS — S138XXA Sprain of joints and ligaments of other parts of neck, initial encounter: Secondary | ICD-10-CM | POA: Diagnosis not present

## 2019-03-24 DIAGNOSIS — M9901 Segmental and somatic dysfunction of cervical region: Secondary | ICD-10-CM | POA: Diagnosis not present

## 2019-03-25 ENCOUNTER — Encounter: Payer: Self-pay | Admitting: Internal Medicine

## 2019-03-26 DIAGNOSIS — M9901 Segmental and somatic dysfunction of cervical region: Secondary | ICD-10-CM | POA: Diagnosis not present

## 2019-03-26 DIAGNOSIS — S138XXA Sprain of joints and ligaments of other parts of neck, initial encounter: Secondary | ICD-10-CM | POA: Diagnosis not present

## 2019-03-31 DIAGNOSIS — S138XXA Sprain of joints and ligaments of other parts of neck, initial encounter: Secondary | ICD-10-CM | POA: Diagnosis not present

## 2019-03-31 DIAGNOSIS — M9901 Segmental and somatic dysfunction of cervical region: Secondary | ICD-10-CM | POA: Diagnosis not present

## 2019-04-02 ENCOUNTER — Other Ambulatory Visit: Payer: Self-pay | Admitting: Internal Medicine

## 2019-04-02 DIAGNOSIS — S138XXA Sprain of joints and ligaments of other parts of neck, initial encounter: Secondary | ICD-10-CM | POA: Diagnosis not present

## 2019-04-02 DIAGNOSIS — M9901 Segmental and somatic dysfunction of cervical region: Secondary | ICD-10-CM | POA: Diagnosis not present

## 2019-04-04 ENCOUNTER — Other Ambulatory Visit: Payer: Self-pay

## 2019-04-04 ENCOUNTER — Other Ambulatory Visit (INDEPENDENT_AMBULATORY_CARE_PROVIDER_SITE_OTHER): Payer: Medicare PPO

## 2019-04-04 DIAGNOSIS — E559 Vitamin D deficiency, unspecified: Secondary | ICD-10-CM | POA: Diagnosis not present

## 2019-04-04 DIAGNOSIS — M85861 Other specified disorders of bone density and structure, right lower leg: Secondary | ICD-10-CM

## 2019-04-04 DIAGNOSIS — M85862 Other specified disorders of bone density and structure, left lower leg: Secondary | ICD-10-CM | POA: Diagnosis not present

## 2019-04-04 DIAGNOSIS — E7849 Other hyperlipidemia: Secondary | ICD-10-CM | POA: Diagnosis not present

## 2019-04-04 DIAGNOSIS — F4321 Adjustment disorder with depressed mood: Secondary | ICD-10-CM

## 2019-04-04 DIAGNOSIS — E890 Postprocedural hypothyroidism: Secondary | ICD-10-CM

## 2019-04-04 LAB — LIPID PANEL
Cholesterol: 136 mg/dL (ref 0–200)
HDL: 31.9 mg/dL — ABNORMAL LOW (ref >39.00)
LDL Cholesterol: 66 mg/dL (ref 0–99)
NonHDL: 104.1
Total CHOL/HDL Ratio: 4
Triglycerides: 192 mg/dL — ABNORMAL HIGH (ref 0.0–149.0)
VLDL: 38.4 mg/dL (ref 0.0–40.0)

## 2019-04-04 LAB — COMPREHENSIVE METABOLIC PANEL
ALT: 23 U/L (ref 0–35)
AST: 24 U/L (ref 0–37)
Albumin: 4.4 g/dL (ref 3.5–5.2)
Alkaline Phosphatase: 89 U/L (ref 39–117)
BUN: 14 mg/dL (ref 6–23)
CO2: 28 mEq/L (ref 19–32)
Calcium: 9.1 mg/dL (ref 8.4–10.5)
Chloride: 105 mEq/L (ref 96–112)
Creatinine, Ser: 0.72 mg/dL (ref 0.40–1.20)
GFR: 80.69 mL/min (ref >60.00)
Glucose, Bld: 99 mg/dL (ref 70–99)
Potassium: 4.2 mEq/L (ref 3.5–5.1)
Sodium: 138 mEq/L (ref 135–145)
Total Bilirubin: 0.6 mg/dL (ref 0.2–1.2)
Total Protein: 7.2 g/dL (ref 6.0–8.3)

## 2019-04-04 LAB — CBC WITH DIFFERENTIAL/PLATELET
Basophils Absolute: 0.1 10*3/uL (ref 0.0–0.1)
Basophils Relative: 0.8 % (ref 0.0–3.0)
Eosinophils Absolute: 0.2 10*3/uL (ref 0.0–0.7)
Eosinophils Relative: 3.6 % (ref 0.0–5.0)
HCT: 41.3 % (ref 36.0–46.0)
Hemoglobin: 14.2 g/dL (ref 12.0–15.0)
Lymphocytes Relative: 32.8 % (ref 12.0–46.0)
Lymphs Abs: 2 10*3/uL (ref 0.7–4.0)
MCHC: 34.3 g/dL (ref 30.0–36.0)
MCV: 92.2 fl (ref 78.0–100.0)
Monocytes Absolute: 0.6 10*3/uL (ref 0.1–1.0)
Monocytes Relative: 9.3 % (ref 3.0–12.0)
Neutro Abs: 3.2 10*3/uL (ref 1.4–7.7)
Neutrophils Relative %: 53.5 % (ref 43.0–77.0)
Platelets: 301 10*3/uL (ref 150.0–400.0)
RBC: 4.48 Mil/uL (ref 3.87–5.11)
RDW: 12.7 % (ref 11.5–15.5)
WBC: 6 10*3/uL (ref 4.0–10.5)

## 2019-04-04 LAB — VITAMIN D 25 HYDROXY (VIT D DEFICIENCY, FRACTURES): VITD: 40.53 ng/mL (ref 30.00–100.00)

## 2019-04-04 LAB — HEMOGLOBIN A1C: Hgb A1c MFr Bld: 5.6 % (ref 4.6–6.5)

## 2019-04-04 LAB — TSH: TSH: 0.99 u[IU]/mL (ref 0.35–4.50)

## 2019-04-10 ENCOUNTER — Other Ambulatory Visit: Payer: Medicare PPO

## 2019-04-13 NOTE — Progress Notes (Signed)
Subjective:    Patient ID: Holly Cochran, female    DOB: November 07, 1950, 69 y.o.   MRN: [id number]  HPI She is here for a physical exam.   Her BP at home this am was 117/79.    She feels good and has no concerns.    Medications and allergies reviewed with patient and updated if appropriate.  Patient Active Problem List   Diagnosis Date Noted  . TMJ arthralgia 04/14/2019  . Essential hypertension 03/15/2019  . Anemia 11/28/2017  . Intertrochanteric fracture of left femur ([place]) 11/18/2017  . Adjustment disorder with depressed mood 02/05/2017  . Vitamin D deficiency 08/31/2016  . Family history of diabetes mellitus in mother 01/30/2016  . Anxiety 03/02/2015  . Chronically dry eyes 07/20/2014  . Common peroneal neuropathy of left lower extremity 05/20/2014  . GERD (gastroesophageal reflux disease) 02/08/2011  . RHINITIS 11/24/2008  . GANGLION OF TENDON SHEATH 11/10/2008  . Hyperlipidemia 08/27/2007  . Osteopenia 08/27/2007  . Hypothyroidism, postradioiodine therapy 06/01/2006    Current Outpatient Medications on File Prior to Visit  Medication Sig Dispense Refill  . Cholecalciferol (VITAMIN D3) 25 MCG (1000 UT) CAPS Take 1,000 Units by mouth daily.    Marland Kitchen escitalopram (LEXAPRO) 20 MG tablet TAKE 1 TABLET ONCE DAILY. 90 tablet 1  . fish oil-omega-3 fatty acids 1000 MG capsule Take 2 g by mouth daily.      Marland Kitchen levothyroxine (SYNTHROID) 125 MCG tablet TAKE 1 TABLET ONCE DAILY EXCEPT TAKE 1/2 TABLET ON TUESDAY, THURSDAY AND SATURDAY. 70 tablet 1  . losartan (COZAAR) 25 MG tablet Take 1 tablet (25 mg total) by mouth daily. 90 tablet 1  . omeprazole (PRILOSEC) 40 MG capsule TAKE 1 CAPSULE ONCE DAILY 30 MINUTES BEFORE A MEAL. 90 capsule 1  . pravastatin (PRAVACHOL) 40 MG tablet Take 1 tablet (40 mg total) by mouth at bedtime. 90 tablet 3   No current facility-administered medications on file prior to visit.    Past Medical History:  Diagnosis Date  . Common peroneal neuropathy of  left lower extremity 05/20/2014  . FASCIITIS, PLANTAR 10/14/2007   Qualifier: Diagnosis of  By: Linna Darner MD, Gwyndolyn Saxon    . GERD (gastroesophageal reflux disease)   . Hyperlipidemia   . Osteopenia 2015   T score -1.1  . Thyroid disease    hyperthyroid-RA I    Past Surgical History:  Procedure Laterality Date  . COLONOSCOPY  2005    Dr Sharlett Iles, negative  . INTRAMEDULLARY (IM) NAIL INTERTROCHANTERIC Left 11/18/2017   Procedure: INTRAMEDULLARY (IM) NAIL INTERTROCHANTRIC;  Surgeon: Dorna Leitz, MD;  Location: [facility];  Service: Orthopedics;  Laterality: Left;  . PLANTAR FASCIA SURGERY  08/2010  . TONSILLECTOMY      Social History   Socioeconomic History  . Marital status: Single    Spouse name: Not on file  . Number of children: Not on file  . Years of education: Not on file  . Highest education level: Not on file  Occupational History  . Not on file  Tobacco Use  . Smoking status: Never Smoker  . Smokeless tobacco: Never Used  Substance and Sexual Activity  . Alcohol use: Yes    Alcohol/week: 1.0 standard drinks    Types: 1 Standard drinks or equivalent per week    Comment: occasionally  . Drug use: No  . Sexual activity: Never    Birth control/protection: Abstinence, Post-menopausal    Comment: Virgin  Other Topics Concern  . Not on file  Social  History Narrative  . Not on file   Social Determinants of Health   Financial Resource Strain:   . Difficulty of Paying Living Expenses:   Food Insecurity:   . Worried About Charity fundraiser in the Last Year:   . Arboriculturist in the Last Year:   Transportation Needs:   . Film/video editor (Medical):   Marland Kitchen Lack of Transportation (Non-Medical):   Physical Activity:   . Days of Exercise per Week:   . Minutes of Exercise per Session:   Stress:   . Feeling of Stress :   Social Connections:   . Frequency of Communication with Friends and Family:   . Frequency of Social Gatherings with Friends and Family:   .  Attends Religious Services:   . Active Member of Clubs or Organizations:   . Attends Archivist Meetings:   Marland Kitchen Marital Status:     Family History  Problem Relation Age of Onset  . Diabetes Mother   . Kidney disease Mother   . Hypertension Mother   . Cancer Father        Pancreatic  . Breast cancer Paternal Aunt        Age 57's  . Cancer Maternal Uncle        melanoma  . Diabetes Maternal Uncle   . Colon cancer Neg Hx   . Esophageal cancer Neg Hx   . Stomach cancer Neg Hx   . Rectal cancer Neg Hx     Review of Systems  Constitutional: Negative for chills and fever.  Eyes: Negative for visual disturbance.  Respiratory: Positive for cough (allergy related). Negative for shortness of breath and wheezing.   Cardiovascular: Negative for chest pain, palpitations and leg swelling.  Gastrointestinal: Negative for abdominal pain, blood in stool, constipation, diarrhea and nausea.  Genitourinary: Negative for dysuria and hematuria.  Musculoskeletal: Positive for arthralgias.  Skin: Negative for color change and rash.  Neurological: Positive for headaches (from TMJ, sinus). Negative for dizziness and light-headedness.  Psychiatric/Behavioral: Negative for dysphoric mood. The patient is not nervous/anxious.        Objective:   Vitals:   04/14/19 0901  BP: (!) 148/84  Pulse: 80  Temp: 98.9 F (37.2 C)  SpO2: 99%   Filed Weights   04/14/19 0901  Weight: 188 lb (85.3 kg)   Body mass index is 31.28 kg/m.  BP Readings from Last 3 Encounters:  04/14/19 (!) 148/84  03/14/19 (!) 150/92  03/12/18 122/72    Wt Readings from Last 3 Encounters:  04/14/19 188 lb (85.3 kg)  03/14/19 186 lb (84.4 kg)  03/12/18 183 lb 12.8 oz (83.4 kg)     Physical Exam Constitutional: She appears well-developed and well-nourished. No distress.  HENT:  Head: Normocephalic and atraumatic.  Right Ear: External ear normal. Normal ear canal and TM Left Ear: External ear normal.  Normal  ear canal and TM Mouth/Throat: Oropharynx is clear and moist.  Eyes: Conjunctivae and EOM are normal.  Neck: Neck supple. No tracheal deviation present. No thyromegaly present.  No carotid bruit  Cardiovascular: Normal rate, regular rhythm and normal heart sounds.   No murmur heard.  No edema. Pulmonary/Chest: Effort normal and breath sounds normal. No respiratory distress. She has no wheezes. She has no rales.  Breast: deferred   Abdominal: Soft. She exhibits no distension. There is no tenderness.  Lymphadenopathy: She has no cervical adenopathy.  Skin: Skin is warm and dry. She is not diaphoretic.  Psychiatric: She has a normal mood and affect. Her behavior is normal.        Assessment & Plan:   Physical exam: Screening blood work    reviewed Immunizations  PPV23 today, shingrix discussed Colonoscopy   Up to date  Mammogram  Up to date  Gyn  Up to date  Dexa  Up to date Eye exams  Up to date  Exercise  Stationary bike, waling Weight  Encouraged weight loss Substance abuse   none  See Problem List for Assessment and Plan of chronic medical problems.     This visit occurred during the SARS-CoV-2 public health emergency.  Safety protocols were in place, including screening questions prior to the visit, additional usage of staff PPE, and extensive cleaning of exam room while observing appropriate contact time as indicated for disinfecting solutions.

## 2019-04-13 NOTE — Patient Instructions (Addendum)
Blood work was reviewed.    All other Health Maintenance issues reviewed.   All recommended immunizations and age-appropriate screenings are up-to-date or discussed.  Pneumonia immunization administered today.   Medications reviewed and updated.  Changes include :   meloxicam and methocarbamol for TMJ.  Your prescription(s) have been submitted to your pharmacy. Please take as directed and contact our office if you believe you are having problem(s) with the medication(s).    Please followup in 1 year    Health Maintenance, Female Adopting a healthy lifestyle and getting preventive care are important in promoting health and wellness. Ask your health care provider about:  The right schedule for you to have regular tests and exams.  Things you can do on your own to prevent diseases and keep yourself healthy. What should I know about diet, weight, and exercise? Eat a healthy diet   Eat a diet that includes plenty of vegetables, fruits, low-fat dairy products, and lean protein.  Do not eat a lot of foods that are high in solid fats, added sugars, or sodium. Maintain a healthy weight Body mass index (BMI) is used to identify weight problems. It estimates body fat based on height and weight. Your health care provider can help determine your BMI and help you achieve or maintain a healthy weight. Get regular exercise Get regular exercise. This is one of the most important things you can do for your health. Most adults should:  Exercise for at least 150 minutes each week. The exercise should increase your heart rate and make you sweat (moderate-intensity exercise).  Do strengthening exercises at least twice a week. This is in addition to the moderate-intensity exercise.  Spend less time sitting. Even light physical activity can be beneficial. Watch cholesterol and blood lipids Have your blood tested for lipids and cholesterol at 68 years of age, then have this test every 5 years. Have  your cholesterol levels checked more often if:  Your lipid or cholesterol levels are high.  You are older than 68 years of age.  You are at high risk for heart disease. What should I know about cancer screening? Depending on your health history and family history, you may need to have cancer screening at various ages. This may include screening for:  Breast cancer.  Cervical cancer.  Colorectal cancer.  Skin cancer.  Lung cancer. What should I know about heart disease, diabetes, and high blood pressure? Blood pressure and heart disease  High blood pressure causes heart disease and increases the risk of stroke. This is more likely to develop in people who have high blood pressure readings, are of African descent, or are overweight.  Have your blood pressure checked: ? Every 3-5 years if you are 30-66 years of age. ? Every year if you are 41 years old or older. Diabetes Have regular diabetes screenings. This checks your fasting blood sugar level. Have the screening done:  Once every three years after age 74 if you are at a normal weight and have a low risk for diabetes.  More often and at a younger age if you are overweight or have a high risk for diabetes. What should I know about preventing infection? Hepatitis B If you have a higher risk for hepatitis B, you should be screened for this virus. Talk with your health care provider to find out if you are at risk for hepatitis B infection. Hepatitis C Testing is recommended for:  Everyone born from [birth year] through 1964.  Anyone with known  risk factors for hepatitis C. Sexually transmitted infections (STIs)  Get screened for STIs, including gonorrhea and chlamydia, if: ? You are sexually active and are younger than 68 years of age. ? You are older than 68 years of age and your health care provider tells you that you are at risk for this type of infection. ? Your sexual activity has changed since you were last screened, and you  are at increased risk for chlamydia or gonorrhea. Ask your health care provider if you are at risk.  Ask your health care provider about whether you are at high risk for HIV. Your health care provider may recommend a prescription medicine to help prevent HIV infection. If you choose to take medicine to prevent HIV, you should first get tested for HIV. You should then be tested every 3 months for as long as you are taking the medicine. Pregnancy  If you are about to stop having your period (premenopausal) and you may become pregnant, seek counseling before you get pregnant.  Take 400 to 800 micrograms (mcg) of folic acid every day if you become pregnant.  Ask for birth control (contraception) if you want to prevent pregnancy. Osteoporosis and menopause Osteoporosis is a disease in which the bones lose minerals and strength with aging. This can result in bone fractures. If you are 63 years old or older, or if you are at risk for osteoporosis and fractures, ask your health care provider if you should:  Be screened for bone loss.  Take a calcium or vitamin D supplement to lower your risk of fractures.  Be given hormone replacement therapy (HRT) to treat symptoms of menopause. Follow these instructions at home: Lifestyle  Do not use any products that contain nicotine or tobacco, such as cigarettes, e-cigarettes, and chewing tobacco. If you need help quitting, ask your health care provider.  Do not use street drugs.  Do not share needles.  Ask your health care provider for help if you need support or information about quitting drugs. Alcohol use  Do not drink alcohol if: ? Your health care provider tells you not to drink. ? You are pregnant, may be pregnant, or are planning to become pregnant.  If you drink alcohol: ? Limit how much you use to 0-1 drink a day. ? Limit intake if you are breastfeeding.  Be aware of how much alcohol is in your drink. In the [place] one drink equals one 12  oz bottle of beer (355 mL), one 5 oz glass of wine (148 mL), or one 1 oz glass of hard liquor (44 mL). General instructions  Schedule regular health, dental, and eye exams.  Stay current with your vaccines.  Tell your health care provider if: ? You often feel depressed. ? You have ever been abused or do not feel safe at home. Summary  Adopting a healthy lifestyle and getting preventive care are important in promoting health and wellness.  Follow your health care provider's instructions about healthy diet, exercising, and getting tested or screened for diseases.  Follow your health care provider's instructions on monitoring your cholesterol and blood pressure. This information is not intended to replace advice given to you by your health care provider. Make sure you discuss any questions you have with your health care provider. Document Revised: 12/19/2017 Document Reviewed: 12/19/2017 Elsevier Patient Education  2020 [employer].

## 2019-04-14 ENCOUNTER — Encounter: Payer: Self-pay | Admitting: Internal Medicine

## 2019-04-14 ENCOUNTER — Other Ambulatory Visit: Payer: Self-pay

## 2019-04-14 ENCOUNTER — Ambulatory Visit (INDEPENDENT_AMBULATORY_CARE_PROVIDER_SITE_OTHER): Payer: Medicare PPO | Admitting: Internal Medicine

## 2019-04-14 VITALS — BP 148/84 | HR 80 | Temp 98.9°F | Ht 65.0 in | Wt 188.0 lb

## 2019-04-14 DIAGNOSIS — M26622 Arthralgia of left temporomandibular joint: Secondary | ICD-10-CM | POA: Diagnosis not present

## 2019-04-14 DIAGNOSIS — S138XXA Sprain of joints and ligaments of other parts of neck, initial encounter: Secondary | ICD-10-CM | POA: Diagnosis not present

## 2019-04-14 DIAGNOSIS — M85861 Other specified disorders of bone density and structure, right lower leg: Secondary | ICD-10-CM | POA: Diagnosis not present

## 2019-04-14 DIAGNOSIS — E7849 Other hyperlipidemia: Secondary | ICD-10-CM | POA: Diagnosis not present

## 2019-04-14 DIAGNOSIS — F419 Anxiety disorder, unspecified: Secondary | ICD-10-CM | POA: Diagnosis not present

## 2019-04-14 DIAGNOSIS — Z0000 Encounter for general adult medical examination without abnormal findings: Secondary | ICD-10-CM

## 2019-04-14 DIAGNOSIS — I10 Essential (primary) hypertension: Secondary | ICD-10-CM

## 2019-04-14 DIAGNOSIS — E890 Postprocedural hypothyroidism: Secondary | ICD-10-CM

## 2019-04-14 DIAGNOSIS — M85862 Other specified disorders of bone density and structure, left lower leg: Secondary | ICD-10-CM

## 2019-04-14 DIAGNOSIS — Z23 Encounter for immunization: Secondary | ICD-10-CM

## 2019-04-14 DIAGNOSIS — M26629 Arthralgia of temporomandibular joint, unspecified side: Secondary | ICD-10-CM | POA: Insufficient documentation

## 2019-04-14 DIAGNOSIS — K219 Gastro-esophageal reflux disease without esophagitis: Secondary | ICD-10-CM

## 2019-04-14 DIAGNOSIS — F32A Depression, unspecified: Secondary | ICD-10-CM

## 2019-04-14 DIAGNOSIS — M9901 Segmental and somatic dysfunction of cervical region: Secondary | ICD-10-CM | POA: Diagnosis not present

## 2019-04-14 DIAGNOSIS — F329 Major depressive disorder, single episode, unspecified: Secondary | ICD-10-CM

## 2019-04-14 MED ORDER — METHOCARBAMOL 500 MG PO TABS: 500.0000 mg | ORAL_TABLET | Freq: Four times a day (QID) | ORAL | 1 refills | Status: DC | PRN

## 2019-04-14 MED ORDER — MELOXICAM 15 MG PO TABS: 15.0000 mg | ORAL_TABLET | Freq: Every day | ORAL | 1 refills | Status: DC

## 2019-04-14 NOTE — Assessment & Plan Note (Signed)
Chronic BP well controlled at home - slightly elevated here Current regimen effective and well tolerated Continue current medications at current doses cmp reviewed

## 2019-04-14 NOTE — Assessment & Plan Note (Signed)
Chronic Controlled, stable Continue current dose of medication  

## 2019-04-14 NOTE — Assessment & Plan Note (Signed)
Lab Results  Component Value Date   TSH 0.99 04/04/2019    Continue current dose of medication

## 2019-04-14 NOTE — Assessment & Plan Note (Addendum)
Acute On left side, had it years ago Some associated headaches Has dental appt next week Try meloxicam and/or methocarbamol

## 2019-04-14 NOTE — Assessment & Plan Note (Signed)
dexa up to date Exercising Taking vitamin d daily Will consider going on fosamax after she completes dental work

## 2019-04-14 NOTE — Assessment & Plan Note (Signed)
Chronic GERD controlled Continue daily medication  

## 2019-04-14 NOTE — Assessment & Plan Note (Signed)
Lipids well controlled Continue pravastatin Will be more regular with fish oil which has helped

## 2019-04-15 DIAGNOSIS — Z1231 Encounter for screening mammogram for malignant neoplasm of breast: Secondary | ICD-10-CM | POA: Diagnosis not present

## 2019-04-16 ENCOUNTER — Encounter: Payer: Self-pay | Admitting: Gynecology

## 2019-04-17 DIAGNOSIS — R921 Mammographic calcification found on diagnostic imaging of breast: Secondary | ICD-10-CM | POA: Diagnosis not present

## 2019-04-21 DIAGNOSIS — S138XXA Sprain of joints and ligaments of other parts of neck, initial encounter: Secondary | ICD-10-CM | POA: Diagnosis not present

## 2019-04-21 DIAGNOSIS — M9901 Segmental and somatic dysfunction of cervical region: Secondary | ICD-10-CM | POA: Diagnosis not present

## 2019-04-22 ENCOUNTER — Encounter: Payer: Self-pay | Admitting: Gynecology

## 2019-04-22 DIAGNOSIS — S138XXA Sprain of joints and ligaments of other parts of neck, initial encounter: Secondary | ICD-10-CM | POA: Diagnosis not present

## 2019-04-22 DIAGNOSIS — M9901 Segmental and somatic dysfunction of cervical region: Secondary | ICD-10-CM | POA: Diagnosis not present

## 2019-04-23 ENCOUNTER — Other Ambulatory Visit: Payer: Self-pay | Admitting: Radiology

## 2019-04-23 ENCOUNTER — Encounter: Payer: Self-pay | Admitting: Internal Medicine

## 2019-04-23 ENCOUNTER — Other Ambulatory Visit: Payer: Self-pay | Admitting: Internal Medicine

## 2019-04-23 DIAGNOSIS — N641 Fat necrosis of breast: Secondary | ICD-10-CM | POA: Diagnosis not present

## 2019-04-23 DIAGNOSIS — E890 Postprocedural hypothyroidism: Secondary | ICD-10-CM

## 2019-04-23 DIAGNOSIS — R921 Mammographic calcification found on diagnostic imaging of breast: Secondary | ICD-10-CM | POA: Diagnosis not present

## 2019-04-23 LAB — HM MAMMOGRAPHY

## 2019-04-24 ENCOUNTER — Other Ambulatory Visit: Payer: Self-pay

## 2019-04-29 ENCOUNTER — Encounter: Payer: Self-pay | Admitting: Gynecology

## 2019-04-29 DIAGNOSIS — M9901 Segmental and somatic dysfunction of cervical region: Secondary | ICD-10-CM | POA: Diagnosis not present

## 2019-04-29 DIAGNOSIS — S138XXA Sprain of joints and ligaments of other parts of neck, initial encounter: Secondary | ICD-10-CM | POA: Diagnosis not present

## 2019-05-02 ENCOUNTER — Encounter: Payer: Self-pay | Admitting: Internal Medicine

## 2019-05-06 DIAGNOSIS — S138XXA Sprain of joints and ligaments of other parts of neck, initial encounter: Secondary | ICD-10-CM | POA: Diagnosis not present

## 2019-05-06 DIAGNOSIS — M9901 Segmental and somatic dysfunction of cervical region: Secondary | ICD-10-CM | POA: Diagnosis not present

## 2019-05-09 ENCOUNTER — Other Ambulatory Visit: Payer: Self-pay | Admitting: Internal Medicine

## 2019-05-09 DIAGNOSIS — E7800 Pure hypercholesterolemia, unspecified: Secondary | ICD-10-CM

## 2019-05-15 DIAGNOSIS — M79601 Pain in right arm: Secondary | ICD-10-CM | POA: Diagnosis not present

## 2019-05-19 DIAGNOSIS — S52101D Unspecified fracture of upper end of right radius, subsequent encounter for closed fracture with routine healing: Secondary | ICD-10-CM | POA: Diagnosis not present

## 2019-05-19 DIAGNOSIS — M25621 Stiffness of right elbow, not elsewhere classified: Secondary | ICD-10-CM | POA: Diagnosis not present

## 2019-05-20 DIAGNOSIS — H21553 Recession of chamber angle, bilateral: Secondary | ICD-10-CM | POA: Diagnosis not present

## 2019-05-20 DIAGNOSIS — H04123 Dry eye syndrome of bilateral lacrimal glands: Secondary | ICD-10-CM | POA: Diagnosis not present

## 2019-05-23 DIAGNOSIS — S52101D Unspecified fracture of upper end of right radius, subsequent encounter for closed fracture with routine healing: Secondary | ICD-10-CM | POA: Diagnosis not present

## 2019-05-23 DIAGNOSIS — M25621 Stiffness of right elbow, not elsewhere classified: Secondary | ICD-10-CM | POA: Diagnosis not present

## 2019-05-27 DIAGNOSIS — S52101D Unspecified fracture of upper end of right radius, subsequent encounter for closed fracture with routine healing: Secondary | ICD-10-CM | POA: Diagnosis not present

## 2019-05-27 DIAGNOSIS — M25621 Stiffness of right elbow, not elsewhere classified: Secondary | ICD-10-CM | POA: Diagnosis not present

## 2019-05-29 DIAGNOSIS — M25621 Stiffness of right elbow, not elsewhere classified: Secondary | ICD-10-CM | POA: Diagnosis not present

## 2019-05-29 DIAGNOSIS — S52101D Unspecified fracture of upper end of right radius, subsequent encounter for closed fracture with routine healing: Secondary | ICD-10-CM | POA: Diagnosis not present

## 2019-06-03 DIAGNOSIS — M25621 Stiffness of right elbow, not elsewhere classified: Secondary | ICD-10-CM | POA: Diagnosis not present

## 2019-06-03 DIAGNOSIS — S52101D Unspecified fracture of upper end of right radius, subsequent encounter for closed fracture with routine healing: Secondary | ICD-10-CM | POA: Diagnosis not present

## 2019-06-04 DIAGNOSIS — M25621 Stiffness of right elbow, not elsewhere classified: Secondary | ICD-10-CM | POA: Diagnosis not present

## 2019-06-04 DIAGNOSIS — S52101D Unspecified fracture of upper end of right radius, subsequent encounter for closed fracture with routine healing: Secondary | ICD-10-CM | POA: Diagnosis not present

## 2019-06-17 DIAGNOSIS — S52101D Unspecified fracture of upper end of right radius, subsequent encounter for closed fracture with routine healing: Secondary | ICD-10-CM | POA: Diagnosis not present

## 2019-06-17 DIAGNOSIS — M25621 Stiffness of right elbow, not elsewhere classified: Secondary | ICD-10-CM | POA: Diagnosis not present

## 2019-06-19 DIAGNOSIS — S52101D Unspecified fracture of upper end of right radius, subsequent encounter for closed fracture with routine healing: Secondary | ICD-10-CM | POA: Diagnosis not present

## 2019-06-19 DIAGNOSIS — M25621 Stiffness of right elbow, not elsewhere classified: Secondary | ICD-10-CM | POA: Diagnosis not present

## 2019-07-10 DIAGNOSIS — S52121D Displaced fracture of head of right radius, subsequent encounter for closed fracture with routine healing: Secondary | ICD-10-CM | POA: Diagnosis not present

## 2019-07-15 ENCOUNTER — Telehealth (INDEPENDENT_AMBULATORY_CARE_PROVIDER_SITE_OTHER): Payer: Medicare PPO | Admitting: Family

## 2019-07-15 DIAGNOSIS — J209 Acute bronchitis, unspecified: Secondary | ICD-10-CM

## 2019-07-15 DIAGNOSIS — J019 Acute sinusitis, unspecified: Secondary | ICD-10-CM | POA: Diagnosis not present

## 2019-07-15 MED ORDER — HYDROCODONE-HOMATROPINE 5-1.5 MG/5ML PO SYRP: 5.0000 mL | ORAL_SOLUTION | Freq: Three times a day (TID) | ORAL | 0 refills | Status: DC | PRN

## 2019-07-15 MED ORDER — AMOXICILLIN-POT CLAVULANATE 875-125 MG PO TABS: 1.0000 | ORAL_TABLET | Freq: Two times a day (BID) | ORAL | 0 refills | Status: AC

## 2019-07-15 NOTE — Progress Notes (Signed)
Holly Cochran is a 69 y.o. female with the following history as recorded in [employer]:  Patient Active Problem List   Diagnosis Date Noted  . TMJ arthralgia 04/14/2019  . Essential hypertension 03/15/2019  . Intertrochanteric fracture of left femur ([place]) 11/18/2017  . Vitamin D deficiency 08/31/2016  . Family history of diabetes mellitus in mother 01/30/2016  . Anxiety and depression 03/02/2015  . Chronically dry eyes 07/20/2014  . Common peroneal neuropathy of left lower extremity 05/20/2014  . GERD (gastroesophageal reflux disease) 02/08/2011  . RHINITIS 11/24/2008  . GANGLION OF TENDON SHEATH 11/10/2008  . Hyperlipidemia 08/27/2007  . Osteopenia 08/27/2007  . Hypothyroidism, postradioiodine therapy 06/01/2006    Current Outpatient Medications  Medication Sig Dispense Refill  . amoxicillin-clavulanate (AUGMENTIN) 875-125 MG tablet Take 1 tablet by mouth 2 (two) times daily for 10 days. 20 tablet 0  . Cholecalciferol (VITAMIN D3) 25 MCG (1000 UT) CAPS Take 1,000 Units by mouth daily.    Marland Kitchen escitalopram (LEXAPRO) 20 MG tablet TAKE 1 TABLET ONCE DAILY. 90 tablet 1  . fish oil-omega-3 fatty acids 1000 MG capsule Take 2 g by mouth daily.      Marland Kitchen HYDROcodone-homatropine (HYCODAN) 5-1.5 MG/5ML syrup Take 5 mLs by mouth every 8 (eight) hours as needed for cough. 120 mL 0  . levothyroxine (SYNTHROID) 125 MCG tablet TAKE 1 TABLET ONCE DAILY EXCEPT TAKE 1/2 TABLET ON TUESDAY, THURSDAY AND SATURDAY. 70 tablet 1  . losartan (COZAAR) 25 MG tablet Take 1 tablet (25 mg total) by mouth daily. 90 tablet 1  . meloxicam (MOBIC) 15 MG tablet Take 1 tablet (15 mg total) by mouth daily. 30 tablet 1  . methocarbamol (ROBAXIN) 500 MG tablet Take 1 tablet (500 mg total) by mouth every 6 (six) hours as needed for muscle spasms. 40 tablet 1  . omeprazole (PRILOSEC) 40 MG capsule TAKE 1 CAPSULE ONCE DAILY 30 MINUTES BEFORE A MEAL. 90 capsule 1  . pravastatin (PRAVACHOL) 40 MG tablet TAKE (1) TABLET DAILY AT  BEDTIME. 90 tablet 0   No current facility-administered medications for this visit.    Allergies: Sulfa antibiotics, Sulfasalazine, Sulfacetamide sodium, and Prednisone  Past Medical History:  Diagnosis Date  . Common peroneal neuropathy of left lower extremity 05/20/2014  . FASCIITIS, PLANTAR 10/14/2007   Qualifier: Diagnosis of  By: Linna Darner MD, Gwyndolyn Saxon    . GERD (gastroesophageal reflux disease)   . Hyperlipidemia   . Osteopenia 2015   T score -1.1  . Thyroid disease    hyperthyroid-RA I    Past Surgical History:  Procedure Laterality Date  . COLONOSCOPY  2005    Dr Sharlett Iles, negative  . INTRAMEDULLARY (IM) NAIL INTERTROCHANTERIC Left 11/18/2017   Procedure: INTRAMEDULLARY (IM) NAIL INTERTROCHANTRIC;  Surgeon: Dorna Leitz, MD;  Location: [facility];  Service: Orthopedics;  Laterality: Left;  . PLANTAR FASCIA SURGERY  08/2010  . TONSILLECTOMY      Family History  Problem Relation Age of Onset  . Diabetes Mother   . Kidney disease Mother   . Hypertension Mother   . Cancer Father        Pancreatic  . Breast cancer Paternal Aunt        Age 12's  . Cancer Maternal Uncle        melanoma  . Diabetes Maternal Uncle   . Colon cancer Neg Hx   . Esophageal cancer Neg Hx   . Stomach cancer Neg Hx   . Rectal cancer Neg Hx     Social  History   Tobacco Use  . Smoking status: Never Smoker  . Smokeless tobacco: Never Used  Substance Use Topics  . Alcohol use: Yes    Alcohol/week: 1.0 standard drink    Types: 1 Standard drinks or equivalent per week    Comment: occasionally    Subjective:   I connected with Holly Cochran on 07/15/19 at 12:00 PM EDT by a video enabled telemedicine application and verified that I am speaking with the correct person using two identifiers.   I discussed the limitations of evaluation and management by telemedicine and the availability of in person appointments. The patient expressed understanding and agreed to proceed. Provider in office/ patient  is at home; provider and patient are only 2 people on video call.   Concern for cough/ congestion- especially problematic at night; does have seasonal allergies and using Flonase; + drainage; no fever, chest pain or shortness of breath; is fully vaccinated against COVID; Asks that allergy to hydrocodone be removed as she can tolerate this medication and would like strong cough syrup;    Objective:  There were no vitals filed for this visit.  General: Well developed, well nourished, in no acute distress  Lungs: Respirations unlabored;  Neurologic: Alert and oriented; speech intact; face symmetrical;  Assessment:   1. Acute sinusitis, recurrence not specified, unspecified location   2. Acute bronchitis, unspecified organism     Plan:  Rx for Augmentin 875 mg bid x 10 days; continue Flonase and use Mucinex DM during day and Hycodan cough syrup at night; increase fluids, rest and follow-up worse, no better.   No follow-ups on file.  No orders of the defined types were placed in this encounter.   Requested Prescriptions   Signed Prescriptions Disp Refills  . HYDROcodone-homatropine (HYCODAN) 5-1.5 MG/5ML syrup 120 mL 0    Sig: Take 5 mLs by mouth every 8 (eight) hours as needed for cough.  Marland Kitchen amoxicillin-clavulanate (AUGMENTIN) 875-125 MG tablet 20 tablet 0    Sig: Take 1 tablet by mouth 2 (two) times daily for 10 days.

## 2019-07-16 DIAGNOSIS — Z20822 Contact with and (suspected) exposure to covid-19: Secondary | ICD-10-CM | POA: Diagnosis not present

## 2019-07-22 ENCOUNTER — Other Ambulatory Visit: Payer: Self-pay | Admitting: Family

## 2019-07-23 MED ORDER — HYDROCODONE-HOMATROPINE 5-1.5 MG/5ML PO SYRP: 5.0000 mL | ORAL_SOLUTION | Freq: Three times a day (TID) | ORAL | 0 refills | Status: DC | PRN

## 2019-08-06 ENCOUNTER — Other Ambulatory Visit: Payer: Self-pay | Admitting: Internal Medicine

## 2019-08-06 DIAGNOSIS — E7800 Pure hypercholesterolemia, unspecified: Secondary | ICD-10-CM

## 2019-09-09 ENCOUNTER — Other Ambulatory Visit: Payer: Self-pay | Admitting: Internal Medicine

## 2019-09-17 ENCOUNTER — Other Ambulatory Visit: Payer: Self-pay | Admitting: Internal Medicine

## 2019-10-01 ENCOUNTER — Other Ambulatory Visit: Payer: Self-pay | Admitting: Internal Medicine

## 2019-10-01 DIAGNOSIS — E890 Postprocedural hypothyroidism: Secondary | ICD-10-CM

## 2019-10-02 ENCOUNTER — Ambulatory Visit (INDEPENDENT_AMBULATORY_CARE_PROVIDER_SITE_OTHER): Payer: Medicare PPO

## 2019-10-02 DIAGNOSIS — Z0000 Encounter for general adult medical examination without abnormal findings: Secondary | ICD-10-CM

## 2019-10-02 NOTE — Patient Instructions (Signed)
[name] , Thank you for taking time to come for your Medicare Wellness Visit. I appreciate your ongoing commitment to your health goals. Please review the following plan we discussed and let me know if I can assist you in the future.   Screening recommendations/referrals: Colonoscopy: 07/08/2014; due every 10 years Mammogram: 04/23/2019 Bone Density: 09/20/2018; due every 2 years Recommended yearly ophthalmology/optometry visit for glaucoma screening and checkup Recommended yearly dental visit for hygiene and checkup  Vaccinations: Influenza vaccine: 11/05/2018 Pneumococcal vaccine: up to date Tdap vaccine: 01/31/2016; due every 10 years Shingles vaccine: never done   Covid-19: up to date [employer])  Advanced directives: Please bring a copy of your health care power of attorney and living will to the office at your convenience.  Conditions/risks identified: Yes; Reviewed health maintenance screenings with patient today and relevant education, vaccines, and/or referrals were provided. Please continue to do your personal lifestyle choices by: daily care of teeth and gums, regular physical activity (goal should be 5 days a week for 30 minutes), eat a healthy diet, avoid tobacco and drug use, limiting any alcohol intake, taking a low-dose aspirin (if not allergic or have been advised by your provider otherwise) and taking vitamins and minerals as recommended by your provider. Continue doing brain stimulating activities (puzzles, reading, adult coloring books, staying active) to keep memory sharp. Continue to eat heart healthy diet (full of fruits, vegetables, whole grains, lean protein, water--limit salt, fat, and sugar intake) and increase physical activity as tolerated.  Next appointment: Please schedule your next Medicare Wellness Visit with your Nurse Health Advisor in 1 year.  Preventive Care 18 Years and Older, Female Preventive care refers to lifestyle choices and visits with your health  care provider that can promote health and wellness. What does preventive care include?  A yearly physical exam. This is also called an annual well check.  Dental exams once or twice a year.  Routine eye exams. Ask your health care provider how often you should have your eyes checked.  Personal lifestyle choices, including:  Daily care of your teeth and gums.  Regular physical activity.  Eating a healthy diet.  Avoiding tobacco and drug use.  Limiting alcohol use.  Practicing safe sex.  Taking low-dose aspirin every day.  Taking vitamin and mineral supplements as recommended by your health care provider. What happens during an annual well check? The services and screenings done by your health care provider during your annual well check will depend on your age, overall health, lifestyle risk factors, and family history of disease. Counseling  Your health care provider may ask you questions about your:  Alcohol use.  Tobacco use.  Drug use.  Emotional well-being.  Home and relationship well-being.  Sexual activity.  Eating habits.  History of falls.  Memory and ability to understand (cognition).  Work and work Statistician.  Reproductive health. Screening  You may have the following tests or measurements:  Height, weight, and BMI.  Blood pressure.  Lipid and cholesterol levels. These may be checked every 5 years, or more frequently if you are over 33 years old.  Skin check.  Lung cancer screening. You may have this screening every year starting at age 69 if you have a 30-pack-year history of smoking and currently smoke or have quit within the past 15 years.  Fecal occult blood test (FOBT) of the stool. You may have this test every year starting at age 43.  Flexible sigmoidoscopy or colonoscopy. You may have a sigmoidoscopy every  5 years or a colonoscopy every 10 years starting at age 32.  Hepatitis C blood test.  Hepatitis B blood test.  Sexually  transmitted disease (STD) testing.  Diabetes screening. This is done by checking your blood sugar (glucose) after you have not eaten for a while (fasting). You may have this done every 1-3 years.  Bone density scan. This is done to screen for osteoporosis. You may have this done starting at age 45.  Mammogram. This may be done every 1-2 years. Talk to your health care provider about how often you should have regular mammograms. Talk with your health care provider about your test results, treatment options, and if necessary, the need for more tests. Vaccines  Your health care provider may recommend certain vaccines, such as:  Influenza vaccine. This is recommended every year.  Tetanus, diphtheria, and acellular pertussis (Tdap, Td) vaccine. You may need a Td booster every 10 years.  Zoster vaccine. You may need this after age 15.  Pneumococcal 13-valent conjugate (PCV13) vaccine. One dose is recommended after age 38.  Pneumococcal polysaccharide (PPSV23) vaccine. One dose is recommended after age 21. Talk to your health care provider about which screenings and vaccines you need and how often you need them. This information is not intended to replace advice given to you by your health care provider. Make sure you discuss any questions you have with your health care provider. Document Released: 01/22/2015 Document Revised: 09/15/2015 Document Reviewed: 10/27/2014 Elsevier Interactive Patient Education  2017 [place] Prevention in the Home Falls can cause injuries. They can happen to people of all ages. There are many things you can do to make your home safe and to help prevent falls. What can I do on the outside of my home?  Regularly fix the edges of walkways and driveways and fix any cracks.  Remove anything that might make you trip as you walk through a door, such as a raised step or threshold.  Trim any bushes or trees on the path to your home.  Use bright outdoor  lighting.  Clear any walking paths of anything that might make someone trip, such as rocks or tools.  Regularly check to see if handrails are loose or broken. Make sure that both sides of any steps have handrails.  Any raised decks and porches should have guardrails on the edges.  Have any leaves, snow, or ice cleared regularly.  Use sand or salt on walking paths during winter.  Clean up any spills in your garage right away. This includes oil or grease spills. What can I do in the bathroom?  Use night lights.  Install grab bars by the toilet and in the tub and shower. Do not use towel bars as grab bars.  Use non-skid mats or decals in the tub or shower.  If you need to sit down in the shower, use a plastic, non-slip stool.  Keep the floor dry. Clean up any water that spills on the floor as soon as it happens.  Remove soap buildup in the tub or shower regularly.  Attach bath mats securely with double-sided non-slip rug tape.  Do not have throw rugs and other things on the floor that can make you trip. What can I do in the bedroom?  Use night lights.  Make sure that you have a light by your bed that is easy to reach.  Do not use any sheets or blankets that are too big for your bed. They should not  hang down onto the floor.  Have a firm chair that has side arms. You can use this for support while you get dressed.  Do not have throw rugs and other things on the floor that can make you trip. What can I do in the kitchen?  Clean up any spills right away.  Avoid walking on wet floors.  Keep items that you use a lot in easy-to-reach places.  If you need to reach something above you, use a strong step stool that has a grab bar.  Keep electrical cords out of the way.  Do not use floor polish or wax that makes floors slippery. If you must use wax, use non-skid floor wax.  Do not have throw rugs and other things on the floor that can make you trip. What can I do with my  stairs?  Do not leave any items on the stairs.  Make sure that there are handrails on both sides of the stairs and use them. Fix handrails that are broken or loose. Make sure that handrails are as long as the stairways.  Check any carpeting to make sure that it is firmly attached to the stairs. Fix any carpet that is loose or worn.  Avoid having throw rugs at the top or bottom of the stairs. If you do have throw rugs, attach them to the floor with carpet tape.  Make sure that you have a light switch at the top of the stairs and the bottom of the stairs. If you do not have them, ask someone to add them for you. What else can I do to help prevent falls?  Wear shoes that:  Do not have high heels.  Have rubber bottoms.  Are comfortable and fit you well.  Are closed at the toe. Do not wear sandals.  If you use a stepladder:  Make sure that it is fully opened. Do not climb a closed stepladder.  Make sure that both sides of the stepladder are locked into place.  Ask someone to hold it for you, if possible.  Clearly mark and make sure that you can see:  Any grab bars or handrails.  First and last steps.  Where the edge of each step is.  Use tools that help you move around (mobility aids) if they are needed. These include:  Canes.  Walkers.  Scooters.  Crutches.  Turn on the lights when you go into a dark area. Replace any light bulbs as soon as they burn out.  Set up your furniture so you have a clear path. Avoid moving your furniture around.  If any of your floors are uneven, fix them.  If there are any pets around you, be aware of where they are.  Review your medicines with your doctor. Some medicines can make you feel dizzy. This can increase your chance of falling. Ask your doctor what other things that you can do to help prevent falls. This information is not intended to replace advice given to you by your health care provider. Make sure you discuss any  questions you have with your health care provider. Document Released: 10/22/2008 Document Revised: 06/03/2015 Document Reviewed: 01/30/2014 Elsevier Interactive Patient Education  2017 [employer].

## 2019-10-02 NOTE — Progress Notes (Addendum)
I connected with Holly Cochran today by telephone and verified that I am speaking with the correct person using two identifiers. Location patient: home Location provider: work Persons participating in the virtual visit: Tala Eber and Lisette Abu, LPN.   I discussed the limitations, risks, security and privacy concerns of performing an evaluation and management service by telephone and the availability of in person appointments. I also discussed with the patient that there may be a patient responsible charge related to this service. The patient expressed understanding and verbally consented to this telephonic visit.    Interactive audio and video telecommunications were attempted between this provider and patient, however failed, due to patient having technical difficulties OR patient did not have access to video capability.  We continued and completed visit with audio only.  Some vital signs may be absent or patient reported.   Time Spent with patient on telephone encounter: 20 minutes  Subjective:   Holly Cochran is a 69 y.o. female who presents for Medicare Annual (Subsequent) preventive examination.  Review of Systems    No ROS. Medicare Wellness Visit. Cardiac Risk Factors include: advanced age (>89mn, >>71women);dyslipidemia;hypertension;obesity (BMI >30kg/m2);family history of premature cardiovascular disease     Objective:    There were no vitals filed for this visit. There is no height or weight on file to calculate BMI.  Advanced Directives 10/02/2019 11/18/2017 11/18/2017 11/18/2017 07/08/2014 06/24/2014  Does Patient Have a Medical Advance Directive? Yes Yes Yes No No;Yes Yes  Type of Advance Directive Living will;Healthcare Power of Attorney Living will;Healthcare Power of [place]  Does patient want to make changes to medical advance directive? No - Patient declined No - Patient  declined - - - -  Copy of [place] Chart? No - copy requested No - copy requested - - - -  Would patient like information on creating a medical advance directive? - - - No - Patient declined - -    Current Medications (verified) Outpatient Encounter Medications as of 10/02/2019  Medication Sig   Cholecalciferol (VITAMIN D3) 25 MCG (1000 UT) CAPS Take 1,000 Units by mouth daily.   escitalopram (LEXAPRO) 20 MG tablet TAKE 1 TABLET ONCE DAILY.   fish oil-omega-3 fatty acids 1000 MG capsule Take 2 g by mouth daily.     HYDROcodone-homatropine (HYCODAN) 5-1.5 MG/5ML syrup Take 5 mLs by mouth every 8 (eight) hours as needed for cough.   levothyroxine (SYNTHROID) 125 MCG tablet TAKE 1 TABLET ONCE DAILY EXCEPT TAKE 1/2 TABLET ON TUESDAY, THURSDAY AND SATURDAY.   losartan (COZAAR) 25 MG tablet TAKE 1 TABLET ONCE DAILY.   meloxicam (MOBIC) 15 MG tablet Take 1 tablet (15 mg total) by mouth daily.   methocarbamol (ROBAXIN) 500 MG tablet TAKE 1 TABLET EVERY 6 HOURS AS NEEDED FOR MUSCLE SPASM.   omeprazole (PRILOSEC) 40 MG capsule TAKE 1 CAPSULE ONCE DAILY 30 MINUTES BEFORE A MEAL.   pravastatin (PRAVACHOL) 40 MG tablet TAKE (1) TABLET DAILY AT BEDTIME.   No facility-administered encounter medications on file as of 10/02/2019.    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 05/20/2014   FASCIITIS, PLANTAR 10/14/2007   Qualifier: Diagnosis of  By: HLinna DarnerMD, WGwyndolyn Saxon    GERD (gastroesophageal reflux disease)    Hyperlipidemia    Osteopenia 2015  T score -1.1   Thyroid disease    hyperthyroid-RA I   Past Surgical History:  Procedure Laterality Date   COLONOSCOPY  2005    Dr Sharlett Iles, negative   INTRAMEDULLARY (IM) NAIL INTERTROCHANTERIC Left 11/18/2017   Procedure: INTRAMEDULLARY (IM) NAIL INTERTROCHANTRIC;  Surgeon:  Dorna Leitz, MD;  Location: [facility];  Service: Orthopedics;  Laterality: Left;   PLANTAR FASCIA SURGERY  08/2010   TONSILLECTOMY     Family History  Problem Relation Age of Onset   Diabetes Mother    Kidney disease Mother    Hypertension Mother    Cancer Father        Pancreatic   Breast cancer Paternal Aunt        Age 77's   Cancer Maternal Uncle        melanoma   Diabetes Maternal Uncle    Colon cancer Neg Hx    Esophageal cancer Neg Hx    Stomach cancer Neg Hx    Rectal cancer Neg Hx    Social History   Socioeconomic History   Marital status: Single    Spouse name: Not on file   Number of children: Not on file   Years of education: Not on file   Highest education level: Not on file  Occupational History   Not on file  Tobacco Use   Smoking status: Never Smoker   Smokeless tobacco: Never Used  Substance and Sexual Activity   Alcohol use: Yes    Alcohol/week: 1.0 standard drink    Types: 1 Standard drinks or equivalent per week    Comment: occasionally   Drug use: No   Sexual activity: Never    Birth control/protection: Abstinence, Post-menopausal    Comment: Virgin  Other Topics Concern   Not on file  Social History Narrative   Not on file   Social Determinants of Health   Financial Resource Strain: Low Risk    Difficulty of Paying Living Expenses: Not hard at all  Food Insecurity: No Food Insecurity   Worried About Charity fundraiser in the Last Year: Never true   Arboriculturist in the Last Year: Never true  Transportation Needs: No Transportation Needs   Lack of Transportation (Medical): No   Lack of Transportation (Non-Medical): No  Physical Activity: Sufficiently Active   Days of Exercise per Week: 5 days   Minutes of Exercise per Session: 30 min  Stress: No Stress Concern Present   Feeling of Stress : Not at all  Social Connections: Socially Integrated   Frequency of Communication with Friends and Family: More than three times a week    Frequency of Social Gatherings with Friends and Family: Once a week   Attends Religious Services: More than 4 times per year   Active Member of [employer] or Organizations: Yes   Attends Music therapist: More than 4 times per year   Marital Status: Married    Tobacco Counseling Counseling given: Not Answered   Clinical Intake:  Pre-visit preparation completed: Yes  Pain : No/denies pain     Nutritional Risks: None Diabetes: No  How often do you need to have someone help you when you read instructions, pamphlets, or other written materials from your doctor or pharmacy?: 1 - Never What is the last grade level you completed in school?: Master's in Education  Diabetic? no  Interpreter Needed?: No  Information entered by :: Aastha Dayley N. Marquelle Musgrave, LPN   Activities of Daily Living In your  present state of health, do you have any difficulty performing the following activities: 10/02/2019  Hearing? N  Vision? N  Difficulty concentrating or making decisions? N  Walking or climbing stairs? N  Dressing or bathing? N  Doing errands, shopping? N  Preparing Food and eating ? N  Using the Toilet? N  In the past six months, have you accidently leaked urine? N  Do you have problems with loss of bowel control? N  Managing your Medications? N  Managing your Finances? N  Housekeeping or managing your Housekeeping? N  Some recent data might be hidden    Patient Care Team: Binnie Rail, MD as PCP - General (Internal Medicine) Berle Mull, MD as Consulting Physician (Family Medicine)  Indicate any recent Medical Services you may have received from other than [facility] providers in the past year (date may be approximate).     Assessment:   This is a routine wellness examination for [place].  Hearing/Vision screen No exam data present  Dietary issues and exercise activities discussed: Current Exercise Habits: Home exercise routine;Structured exercise class, Type of exercise:  strength training/weights;stretching;walking;Other - see comments (working with trainer 2 days/week for 30 mins; walking in the neighborhood.), Time (Minutes): 30, Frequency (Times/Week): 5, Weekly Exercise (Minutes/Week): 150, Intensity: Moderate, Exercise limited by: psychological condition(s)  Goals      Patient Stated     I finally met my weight loss goal of 20 pounds and I have so thrilled.       Depression Screen PHQ 2/9 Scores 10/02/2019 03/14/2019 01/03/2017 03/03/2016 12/24/2015  PHQ - 2 Score 0 0 0 0 0    Fall Risk Fall Risk  10/02/2019 03/14/2019 01/03/2017 03/03/2016 12/24/2015  Falls in the past year? 1 0 No No No  Comment broke both wrists - - - -  Number falls in past yr: 0 0 - - -  Injury with Fall? 1 1 - - -  Risk for fall due to : Other (Comment) - - - -  Risk for fall due to: Comment accident with shoe - - - -  Follow up Falls evaluation completed - - - -    Any stairs in or around the home? Yes  If so, are there any without handrails? No  Home free of loose throw rugs in walkways, pet beds, electrical cords, etc? Yes  Adequate lighting in your home to reduce risk of falls? Yes   ASSISTIVE DEVICES UTILIZED TO PREVENT FALLS:  Life alert? No  Use of a cane, walker or w/c? No  Grab bars in the bathroom? No  Shower chair or bench in shower? No  Elevated toilet seat or a handicapped toilet? Yes   TIMED UP AND GO:  Was the test performed? No .  Length of time to ambulate 10 feet: 0 sec.   Gait steady and fast without use of assistive device  Cognitive Function: Patient is cogitatively intact.        Immunizations Immunization History  Administered Date(s) Administered   Fluad Quad(high Dose 65+) 10/10/2018, 11/05/2018   Influenza, High Dose Seasonal PF 11/10/2016   Influenza, Seasonal, Injecte, Preservative Fre 11/03/2013   Influenza,inj,Quad PF,6+ Mos 11/19/2015   Influenza,inj,quad, With Preservative 12/02/2014   Influenza-Unspecified 09/09/2012,  12/02/2014, 10/10/2015   PFIZER SARS-COV-2 Vaccination 01/30/2019, 02/20/2019   Pneumococcal Conjugate-13 02/05/2017   Pneumococcal Polysaccharide-23 04/14/2019   Tdap 01/31/2016   Zoster 12/09/2014    TDAP status: Up to date Flu Vaccine status: Up to date Pneumococcal vaccine status:  Up to date Covid-19 vaccine status: Completed vaccines  Qualifies for Shingles Vaccine? Yes   Zostavax completed Yes   Shingrix Completed?: No.    Education has been provided regarding the importance of this vaccine. Patient has been advised to call insurance company to determine out of pocket expense if they have not yet received this vaccine. Advised may also receive vaccine at local pharmacy or Health Dept. Verbalized acceptance and understanding.  Screening Tests Health Maintenance  Topic Date Due   INFLUENZA VACCINE  08/10/2019   DEXA SCAN  09/19/2020   MAMMOGRAM  04/22/2021   COLONOSCOPY  07/07/2024   TETANUS/TDAP  01/30/2026   COVID-19 Vaccine  Completed   Hepatitis C Screening  Completed   PNA vac Low Risk Adult  Completed    Health Maintenance  Health Maintenance Due  Topic Date Due   INFLUENZA VACCINE  08/10/2019    Colorectal cancer screening: Completed 07/08/2014. Repeat every 10 years Mammogram status: Completed 04/23/2019. Repeat every year Bone Density status: Completed 09/20/2018. Results reflect: Bone density results: NORMAL. Repeat every 2 years.  Lung Cancer Screening: (Low Dose CT Chest recommended if Age 43-80 years, 30 pack-year currently smoking OR have quit w/in 15years.) does not qualify.   Lung Cancer Screening Referral: no  Additional Screening:  Hepatitis C Screening: does qualify; Completed yes  Vision Screening: Recommended annual ophthalmology exams for early detection of glaucoma and other disorders of the eye. Is the patient up to date with their annual eye exam?  Yes  Who is the provider or what is the name of the office in which the patient attends  annual eye exams? [facility] If pt is not established with a provider, would they like to be referred to a provider to establish care? No .   Dental Screening: Recommended annual dental exams for proper oral hygiene  Community Resource Referral / Chronic Care Management: CRR required this visit?  No   CCM required this visit?  No      Plan:     I have personally reviewed and noted the following in the patient's chart:   Medical and social history Use of alcohol, tobacco or illicit drugs  Current medications and supplements Functional ability and status Nutritional status Physical activity Advanced directives List of other physicians Hospitalizations, surgeries, and ER visits in previous 12 months Vitals Screenings to include cognitive, depression, and falls Referrals and appointments  In addition, I have reviewed and discussed with patient certain preventive protocols, quality metrics, and best practice recommendations. A written personalized care plan for preventive services as well as general preventive health recommendations were provided to patient.     Sheral Flow, LPN   02/05/5168   Nurse Notes:  Patient is cogitatively intact. There were no vitals filed for this visit. There is no height or weight on file to calculate BMI. Patient stated that she has no issues with gait or balance; does not use any assistive devices.   Medical screening examination/treatment/procedure(s) were performed by non-physician practitioner and as supervising physician I was immediately available for consultation/collaboration.  I agree with above. Lew Dawes, MD

## 2019-11-25 DIAGNOSIS — R921 Mammographic calcification found on diagnostic imaging of breast: Secondary | ICD-10-CM | POA: Diagnosis not present

## 2019-12-01 ENCOUNTER — Encounter: Payer: Self-pay | Admitting: Internal Medicine

## 2019-12-02 MED ORDER — ALENDRONATE SODIUM 70 MG PO TABS: 70.0000 mg | ORAL_TABLET | ORAL | 3 refills | Status: DC

## 2019-12-24 ENCOUNTER — Telehealth: Payer: Self-pay | Admitting: Internal Medicine

## 2019-12-24 ENCOUNTER — Other Ambulatory Visit: Payer: Self-pay

## 2019-12-24 ENCOUNTER — Telehealth (INDEPENDENT_AMBULATORY_CARE_PROVIDER_SITE_OTHER): Payer: Medicare PPO | Admitting: Internal Medicine

## 2019-12-24 ENCOUNTER — Encounter: Payer: Self-pay | Admitting: Internal Medicine

## 2019-12-24 DIAGNOSIS — J019 Acute sinusitis, unspecified: Secondary | ICD-10-CM | POA: Insufficient documentation

## 2019-12-24 DIAGNOSIS — J0100 Acute maxillary sinusitis, unspecified: Secondary | ICD-10-CM | POA: Diagnosis not present

## 2019-12-24 MED ORDER — HYDROCOD POLST-CPM POLST ER 10-8 MG/5ML PO SUER
5.0000 mL | Freq: Two times a day (BID) | ORAL | 0 refills | Status: DC | PRN
Start: 2019-12-24 — End: 2020-01-09

## 2019-12-24 MED ORDER — AMOXICILLIN-POT CLAVULANATE 875-125 MG PO TABS: 1.0000 | ORAL_TABLET | Freq: Two times a day (BID) | ORAL | 0 refills | Status: DC

## 2019-12-24 MED ORDER — HYDROCODONE-HOMATROPINE 5-1.5 MG/5ML PO SYRP: 5.0000 mL | ORAL_SOLUTION | Freq: Three times a day (TID) | ORAL | 0 refills | Status: DC | PRN

## 2019-12-24 NOTE — Assessment & Plan Note (Signed)
Acute Likely bacterial  Start Augmentin 875-125 mg BID x 10 day Hycodan cough syrup on back order-we will prescribe Tussionex otc cold medications Rest, fluid Call if no improvement

## 2019-12-24 NOTE — Progress Notes (Signed)
Virtual Visit via Video Note  I connected with Holly Cochran on 12/24/19 at  2:45 PM EST by a video enabled telemedicine application and verified that I am speaking with the correct person using two identifiers.   I discussed the limitations of evaluation and management by telemedicine and the availability of in person appointments. The patient expressed understanding and agreed to proceed.  Present for the visit:  Myself, Dr Billey Gosling, [name].  The patient is currently at home and I am in the office.    No referring provider.    History of Present Illness: She is here for an acute visit for cold symptoms.   Her symptoms started several days ago.  She is experiencing fatigue, productive cough, nasal congestion, postnasal drainage, sinus pain and headaches.  She is not sleeping well.  She feels the symptoms are similar to her previous sinus infections  She has tried taking Advil cold and sinus.   covid test at home x 2 neg  Review of Systems  Constitutional: Positive for malaise/fatigue. Negative for chills and fever.  HENT: Positive for congestion and sinus pain. Negative for ear pain and sore throat.   Respiratory: Positive for cough and sputum production.   Neurological: Positive for headaches.      Social History   Socioeconomic History  . Marital status: Single    Spouse name: Not on file  . Number of children: Not on file  . Years of education: Not on file  . Highest education level: Not on file  Occupational History  . Not on file  Tobacco Use  . Smoking status: Never Smoker  . Smokeless tobacco: Never Used  Substance and Sexual Activity  . Alcohol use: Yes    Alcohol/week: 1.0 standard drink    Types: 1 Standard drinks or equivalent per week    Comment: occasionally  . Drug use: No  . Sexual activity: Never    Birth control/protection: Abstinence, Post-menopausal    Comment: Virgin  Other Topics Concern  . Not on file  Social History  Narrative  . Not on file   Social Determinants of Health   Financial Resource Strain: Low Risk   . Difficulty of Paying Living Expenses: Not hard at all  Food Insecurity: No Food Insecurity  . Worried About Charity fundraiser in the Last Year: Never true  . Ran Out of Food in the Last Year: Never true  Transportation Needs: No Transportation Needs  . Lack of Transportation (Medical): No  . Lack of Transportation (Non-Medical): No  Physical Activity: Sufficiently Active  . Days of Exercise per Week: 5 days  . Minutes of Exercise per Session: 30 min  Stress: No Stress Concern Present  . Feeling of Stress : Not at all  Social Connections: Socially Integrated  . Frequency of Communication with Friends and Family: More than three times a week  . Frequency of Social Gatherings with Friends and Family: Once a week  . Attends Religious Services: More than 4 times per year  . Active Member of Clubs or Organizations: Yes  . Attends Archivist Meetings: More than 4 times per year  . Marital Status: Married     Observations/Objective: Appears well in NAD Breathing normally Intermittent cough Skin appears warm and dry  Assessment and Plan:  See Problem List for Assessment and Plan of chronic medical problems.   Follow Up Instructions:    I discussed the assessment and treatment plan with the patient. The  patient was provided an opportunity to ask questions and all were answered. The patient agreed with the plan and demonstrated an understanding of the instructions.   The patient was advised to call back or seek an in-person evaluation if the symptoms worsen or if the condition fails to improve as anticipated.    Binnie Rail, MD

## 2019-12-24 NOTE — Telephone Encounter (Signed)
tussionex sent

## 2019-12-24 NOTE — Telephone Encounter (Signed)
   Patient report HYDROcodone-homatropine (HYCODAN) 5-1.5 MG/5ML syrup is on backorder for at least 2 months at [facility],  Please send alternative

## 2019-12-25 ENCOUNTER — Other Ambulatory Visit: Payer: Self-pay | Admitting: Internal Medicine

## 2019-12-31 ENCOUNTER — Other Ambulatory Visit: Payer: Self-pay | Admitting: Internal Medicine

## 2019-12-31 DIAGNOSIS — E890 Postprocedural hypothyroidism: Secondary | ICD-10-CM

## 2020-01-07 ENCOUNTER — Other Ambulatory Visit: Payer: Self-pay | Admitting: Internal Medicine

## 2020-01-13 DIAGNOSIS — M545 Low back pain, unspecified: Secondary | ICD-10-CM | POA: Diagnosis not present

## 2020-01-13 DIAGNOSIS — M25552 Pain in left hip: Secondary | ICD-10-CM | POA: Diagnosis not present

## 2020-01-13 DIAGNOSIS — M25522 Pain in left elbow: Secondary | ICD-10-CM | POA: Diagnosis not present

## 2020-01-22 DIAGNOSIS — S46212D Strain of muscle, fascia and tendon of other parts of biceps, left arm, subsequent encounter: Secondary | ICD-10-CM | POA: Diagnosis not present

## 2020-01-22 DIAGNOSIS — S46312D Strain of muscle, fascia and tendon of triceps, left arm, subsequent encounter: Secondary | ICD-10-CM | POA: Diagnosis not present

## 2020-01-27 ENCOUNTER — Other Ambulatory Visit: Payer: Medicare PPO

## 2020-01-30 DIAGNOSIS — S46212D Strain of muscle, fascia and tendon of other parts of biceps, left arm, subsequent encounter: Secondary | ICD-10-CM | POA: Diagnosis not present

## 2020-01-30 DIAGNOSIS — S46312D Strain of muscle, fascia and tendon of triceps, left arm, subsequent encounter: Secondary | ICD-10-CM | POA: Diagnosis not present

## 2020-02-04 DIAGNOSIS — S46312D Strain of muscle, fascia and tendon of triceps, left arm, subsequent encounter: Secondary | ICD-10-CM | POA: Diagnosis not present

## 2020-02-04 DIAGNOSIS — S46212D Strain of muscle, fascia and tendon of other parts of biceps, left arm, subsequent encounter: Secondary | ICD-10-CM | POA: Diagnosis not present

## 2020-02-10 DIAGNOSIS — M546 Pain in thoracic spine: Secondary | ICD-10-CM | POA: Diagnosis not present

## 2020-02-10 DIAGNOSIS — M542 Cervicalgia: Secondary | ICD-10-CM | POA: Diagnosis not present

## 2020-02-16 DIAGNOSIS — S46212D Strain of muscle, fascia and tendon of other parts of biceps, left arm, subsequent encounter: Secondary | ICD-10-CM | POA: Diagnosis not present

## 2020-02-16 DIAGNOSIS — S46312D Strain of muscle, fascia and tendon of triceps, left arm, subsequent encounter: Secondary | ICD-10-CM | POA: Diagnosis not present

## 2020-02-19 DIAGNOSIS — S46312D Strain of muscle, fascia and tendon of triceps, left arm, subsequent encounter: Secondary | ICD-10-CM | POA: Diagnosis not present

## 2020-02-19 DIAGNOSIS — S46212D Strain of muscle, fascia and tendon of other parts of biceps, left arm, subsequent encounter: Secondary | ICD-10-CM | POA: Diagnosis not present

## 2020-02-24 DIAGNOSIS — S46312D Strain of muscle, fascia and tendon of triceps, left arm, subsequent encounter: Secondary | ICD-10-CM | POA: Diagnosis not present

## 2020-02-24 DIAGNOSIS — S46212D Strain of muscle, fascia and tendon of other parts of biceps, left arm, subsequent encounter: Secondary | ICD-10-CM | POA: Diagnosis not present

## 2020-02-26 DIAGNOSIS — S46212D Strain of muscle, fascia and tendon of other parts of biceps, left arm, subsequent encounter: Secondary | ICD-10-CM | POA: Diagnosis not present

## 2020-02-26 DIAGNOSIS — S46312D Strain of muscle, fascia and tendon of triceps, left arm, subsequent encounter: Secondary | ICD-10-CM | POA: Diagnosis not present

## 2020-03-02 DIAGNOSIS — S46212D Strain of muscle, fascia and tendon of other parts of biceps, left arm, subsequent encounter: Secondary | ICD-10-CM | POA: Diagnosis not present

## 2020-03-02 DIAGNOSIS — S46312D Strain of muscle, fascia and tendon of triceps, left arm, subsequent encounter: Secondary | ICD-10-CM | POA: Diagnosis not present

## 2020-03-04 DIAGNOSIS — S46312D Strain of muscle, fascia and tendon of triceps, left arm, subsequent encounter: Secondary | ICD-10-CM | POA: Diagnosis not present

## 2020-03-04 DIAGNOSIS — S46212D Strain of muscle, fascia and tendon of other parts of biceps, left arm, subsequent encounter: Secondary | ICD-10-CM | POA: Diagnosis not present

## 2020-03-09 DIAGNOSIS — S46312D Strain of muscle, fascia and tendon of triceps, left arm, subsequent encounter: Secondary | ICD-10-CM | POA: Diagnosis not present

## 2020-03-09 DIAGNOSIS — S46212D Strain of muscle, fascia and tendon of other parts of biceps, left arm, subsequent encounter: Secondary | ICD-10-CM | POA: Diagnosis not present

## 2020-03-11 ENCOUNTER — Other Ambulatory Visit: Payer: Self-pay | Admitting: Internal Medicine

## 2020-03-22 DIAGNOSIS — S46212D Strain of muscle, fascia and tendon of other parts of biceps, left arm, subsequent encounter: Secondary | ICD-10-CM | POA: Diagnosis not present

## 2020-03-22 DIAGNOSIS — S46312D Strain of muscle, fascia and tendon of triceps, left arm, subsequent encounter: Secondary | ICD-10-CM | POA: Diagnosis not present

## 2020-03-23 ENCOUNTER — Other Ambulatory Visit: Payer: Self-pay | Admitting: Internal Medicine

## 2020-03-23 DIAGNOSIS — E890 Postprocedural hypothyroidism: Secondary | ICD-10-CM

## 2020-03-24 DIAGNOSIS — S46312D Strain of muscle, fascia and tendon of triceps, left arm, subsequent encounter: Secondary | ICD-10-CM | POA: Diagnosis not present

## 2020-03-24 DIAGNOSIS — S46212D Strain of muscle, fascia and tendon of other parts of biceps, left arm, subsequent encounter: Secondary | ICD-10-CM | POA: Diagnosis not present

## 2020-03-30 DIAGNOSIS — S46312D Strain of muscle, fascia and tendon of triceps, left arm, subsequent encounter: Secondary | ICD-10-CM | POA: Diagnosis not present

## 2020-03-30 DIAGNOSIS — S46212D Strain of muscle, fascia and tendon of other parts of biceps, left arm, subsequent encounter: Secondary | ICD-10-CM | POA: Diagnosis not present

## 2020-04-01 ENCOUNTER — Other Ambulatory Visit: Payer: Self-pay | Admitting: Internal Medicine

## 2020-04-01 DIAGNOSIS — S46212D Strain of muscle, fascia and tendon of other parts of biceps, left arm, subsequent encounter: Secondary | ICD-10-CM | POA: Diagnosis not present

## 2020-04-01 DIAGNOSIS — S46312D Strain of muscle, fascia and tendon of triceps, left arm, subsequent encounter: Secondary | ICD-10-CM | POA: Diagnosis not present

## 2020-04-06 DIAGNOSIS — S46212D Strain of muscle, fascia and tendon of other parts of biceps, left arm, subsequent encounter: Secondary | ICD-10-CM | POA: Diagnosis not present

## 2020-04-06 DIAGNOSIS — M546 Pain in thoracic spine: Secondary | ICD-10-CM | POA: Diagnosis not present

## 2020-04-06 DIAGNOSIS — M542 Cervicalgia: Secondary | ICD-10-CM | POA: Diagnosis not present

## 2020-04-06 DIAGNOSIS — S46312D Strain of muscle, fascia and tendon of triceps, left arm, subsequent encounter: Secondary | ICD-10-CM | POA: Diagnosis not present

## 2020-04-08 DIAGNOSIS — S46212D Strain of muscle, fascia and tendon of other parts of biceps, left arm, subsequent encounter: Secondary | ICD-10-CM | POA: Diagnosis not present

## 2020-04-08 DIAGNOSIS — S46312D Strain of muscle, fascia and tendon of triceps, left arm, subsequent encounter: Secondary | ICD-10-CM | POA: Diagnosis not present

## 2020-04-13 DIAGNOSIS — S46312D Strain of muscle, fascia and tendon of triceps, left arm, subsequent encounter: Secondary | ICD-10-CM | POA: Diagnosis not present

## 2020-04-13 DIAGNOSIS — S46212D Strain of muscle, fascia and tendon of other parts of biceps, left arm, subsequent encounter: Secondary | ICD-10-CM | POA: Diagnosis not present

## 2020-04-19 DIAGNOSIS — S46312D Strain of muscle, fascia and tendon of triceps, left arm, subsequent encounter: Secondary | ICD-10-CM | POA: Diagnosis not present

## 2020-04-19 DIAGNOSIS — S46212D Strain of muscle, fascia and tendon of other parts of biceps, left arm, subsequent encounter: Secondary | ICD-10-CM | POA: Diagnosis not present

## 2020-04-27 DIAGNOSIS — S46212D Strain of muscle, fascia and tendon of other parts of biceps, left arm, subsequent encounter: Secondary | ICD-10-CM | POA: Diagnosis not present

## 2020-04-27 DIAGNOSIS — S46312D Strain of muscle, fascia and tendon of triceps, left arm, subsequent encounter: Secondary | ICD-10-CM | POA: Diagnosis not present

## 2020-05-11 DIAGNOSIS — S46212D Strain of muscle, fascia and tendon of other parts of biceps, left arm, subsequent encounter: Secondary | ICD-10-CM | POA: Diagnosis not present

## 2020-05-11 DIAGNOSIS — S46312D Strain of muscle, fascia and tendon of triceps, left arm, subsequent encounter: Secondary | ICD-10-CM | POA: Diagnosis not present

## 2020-05-13 DIAGNOSIS — M7701 Medial epicondylitis, right elbow: Secondary | ICD-10-CM | POA: Diagnosis not present

## 2020-05-13 DIAGNOSIS — M79602 Pain in left arm: Secondary | ICD-10-CM | POA: Diagnosis not present

## 2020-05-13 DIAGNOSIS — M79601 Pain in right arm: Secondary | ICD-10-CM | POA: Diagnosis not present

## 2020-05-16 ENCOUNTER — Other Ambulatory Visit: Payer: Self-pay | Admitting: Internal Medicine

## 2020-05-16 DIAGNOSIS — E7800 Pure hypercholesterolemia, unspecified: Secondary | ICD-10-CM

## 2020-05-19 ENCOUNTER — Encounter: Payer: Self-pay | Admitting: Internal Medicine

## 2020-05-19 NOTE — Patient Instructions (Addendum)
Blood work was ordered.     Medications changes include :   none     Please followup in 1 year    Health Maintenance, Female Adopting a healthy lifestyle and getting preventive care are important in promoting health and wellness. Ask your health care provider about:  The right schedule for you to have regular tests and exams.  Things you can do on your own to prevent diseases and keep yourself healthy. What should I know about diet, weight, and exercise? Eat a healthy diet  Eat a diet that includes plenty of vegetables, fruits, low-fat dairy products, and lean protein.  Do not eat a lot of foods that are high in solid fats, added sugars, or sodium.   Maintain a healthy weight Body mass index (BMI) is used to identify weight problems. It estimates body fat based on height and weight. Your health care provider can help determine your BMI and help you achieve or maintain a healthy weight. Get regular exercise Get regular exercise. This is one of the most important things you can do for your health. Most adults should:  Exercise for at least 150 minutes each week. The exercise should increase your heart rate and make you sweat (moderate-intensity exercise).  Do strengthening exercises at least twice a week. This is in addition to the moderate-intensity exercise.  Spend less time sitting. Even light physical activity can be beneficial. Watch cholesterol and blood lipids Have your blood tested for lipids and cholesterol at 69 years of age, then have this test every 5 years. Have your cholesterol levels checked more often if:  Your lipid or cholesterol levels are high.  You are older than 69 years of age.  You are at high risk for heart disease. What should I know about cancer screening? Depending on your health history and family history, you may need to have cancer screening at various ages. This may include screening for:  Breast cancer.  Cervical cancer.  Colorectal  cancer.  Skin cancer.  Lung cancer. What should I know about heart disease, diabetes, and high blood pressure? Blood pressure and heart disease  High blood pressure causes heart disease and increases the risk of stroke. This is more likely to develop in people who have high blood pressure readings, are of African descent, or are overweight.  Have your blood pressure checked: ? Every 3-5 years if you are 18-39 years of age. ? Every year if you are 40 years old or older. Diabetes Have regular diabetes screenings. This checks your fasting blood sugar level. Have the screening done:  Once every three years after age 40 if you are at a normal weight and have a low risk for diabetes.  More often and at a younger age if you are overweight or have a high risk for diabetes. What should I know about preventing infection? Hepatitis B If you have a higher risk for hepatitis B, you should be screened for this virus. Talk with your health care provider to find out if you are at risk for hepatitis B infection. Hepatitis C Testing is recommended for:  Everyone born from 1945 through 1965.  Anyone with known risk factors for hepatitis C. Sexually transmitted infections (STIs)  Get screened for STIs, including gonorrhea and chlamydia, if: ? You are sexually active and are younger than 69 years of age. ? You are older than 69 years of age and your health care provider tells you that you are at risk for this type of   infection. ? Your sexual activity has changed since you were last screened, and you are at increased risk for chlamydia or gonorrhea. Ask your health care provider if you are at risk.  Ask your health care provider about whether you are at high risk for HIV. Your health care provider may recommend a prescription medicine to help prevent HIV infection. If you choose to take medicine to prevent HIV, you should first get tested for HIV. You should then be tested every 3 months for as long as  you are taking the medicine. Pregnancy  If you are about to stop having your period (premenopausal) and you may become pregnant, seek counseling before you get pregnant.  Take 400 to 800 micrograms (mcg) of folic acid every day if you become pregnant.  Ask for birth control (contraception) if you want to prevent pregnancy. Osteoporosis and menopause Osteoporosis is a disease in which the bones lose minerals and strength with aging. This can result in bone fractures. If you are 65 years old or older, or if you are at risk for osteoporosis and fractures, ask your health care provider if you should:  Be screened for bone loss.  Take a calcium or vitamin D supplement to lower your risk of fractures.  Be given hormone replacement therapy (HRT) to treat symptoms of menopause. Follow these instructions at home: Lifestyle  Do not use any products that contain nicotine or tobacco, such as cigarettes, e-cigarettes, and chewing tobacco. If you need help quitting, ask your health care provider.  Do not use street drugs.  Do not share needles.  Ask your health care provider for help if you need support or information about quitting drugs. Alcohol use  Do not drink alcohol if: ? Your health care provider tells you not to drink. ? You are pregnant, may be pregnant, or are planning to become pregnant.  If you drink alcohol: ? Limit how much you use to 0-1 drink a day. ? Limit intake if you are breastfeeding.  Be aware of how much alcohol is in your drink. In the [place] one drink equals one 12 oz bottle of beer (355 mL), one 5 oz glass of wine (148 mL), or one 1 oz glass of hard liquor (44 mL). General instructions  Schedule regular health, dental, and eye exams.  Stay current with your vaccines.  Tell your health care provider if: ? You often feel depressed. ? You have ever been abused or do not feel safe at home. Summary  Adopting a healthy lifestyle and getting preventive care are  important in promoting health and wellness.  Follow your health care provider's instructions about healthy diet, exercising, and getting tested or screened for diseases.  Follow your health care provider's instructions on monitoring your cholesterol and blood pressure. This information is not intended to replace advice given to you by your health care provider. Make sure you discuss any questions you have with your health care provider. Document Revised: 12/19/2017 Document Reviewed: 12/19/2017 Elsevier Patient Education  2021 [employer]  

## 2020-05-19 NOTE — Progress Notes (Signed)
Subjective:    Patient ID: Holly Cochran, female    DOB: Sep 02, 1950, 70 y.o.   MRN: [id number]   This visit occurred during the SARS-CoV-2 public health emergency.  Safety protocols were in place, including screening questions prior to the visit, additional usage of staff PPE, and extensive cleaning of exam room while observing appropriate contact time as indicated for disinfecting solutions.    HPI She is here for a physical exam.   She has been doing rolfing and it has made a tremendous difference in how she feels.    Medications and allergies reviewed with patient and updated if appropriate.  Patient Active Problem List   Diagnosis Date Noted  . TMJ arthralgia 04/14/2019  . Essential hypertension 03/15/2019  . Intertrochanteric fracture of left femur ([place]) 11/18/2017  . Vitamin D deficiency 08/31/2016  . Family history of diabetes mellitus in mother 01/30/2016  . Anxiety and depression 03/02/2015  . Chronically dry eyes 07/20/2014  . Common peroneal neuropathy of left lower extremity 05/20/2014  . GERD (gastroesophageal reflux disease) 02/08/2011  . RHINITIS 11/24/2008  . GANGLION OF TENDON SHEATH 11/10/2008  . Hyperlipidemia 08/27/2007  . Osteoporosis 08/27/2007  . Hypothyroidism, postradioiodine therapy 06/01/2006    Current Outpatient Medications on File Prior to Visit  Medication Sig Dispense Refill  . alendronate (FOSAMAX) 70 MG tablet Take 1 tablet (70 mg total) by mouth every 7 (seven) days. Take with a full glass of water on an empty stomach. 12 tablet 3  . Cholecalciferol (VITAMIN D3) 25 MCG (1000 UT) CAPS Take 1,000 Units by mouth daily.    Marland Kitchen escitalopram (LEXAPRO) 20 MG tablet TAKE 1 TABLET ONCE DAILY. 90 tablet 1  . fish oil-omega-3 fatty acids 1000 MG capsule Take 2 g by mouth daily.    Marland Kitchen levothyroxine (SYNTHROID) 125 MCG tablet TAKE 1 TABLET ONCE DAILY EXCEPT TAKE 1/2 TABLET ON TUESDAY, THURSDAY AND SATURDAY. 70 tablet 0  . losartan (COZAAR) 25 MG  tablet TAKE 1 TABLET ONCE DAILY. 90 tablet 1  . meloxicam (MOBIC) 15 MG tablet Take 1 tablet (15 mg total) by mouth daily. 30 tablet 1  . methocarbamol (ROBAXIN) 500 MG tablet TAKE 1 TABLET EVERY 6 HOURS AS NEEDED FOR MUSCLE SPASM. 60 tablet 3  . omeprazole (PRILOSEC) 40 MG capsule TAKE 1 CAPSULE ONCE DAILY 30 MINUTES BEFORE A MEAL. 90 capsule 0  . pravastatin (PRAVACHOL) 40 MG tablet TAKE (1) TABLET DAILY AT BEDTIME. 90 tablet 2  . tiZANidine (ZANAFLEX) 4 MG tablet Take 4 mg by mouth every 8 (eight) hours as needed.     No current facility-administered medications on file prior to visit.    Past Medical History:  Diagnosis Date  . Common peroneal neuropathy of left lower extremity 05/20/2014  . FASCIITIS, PLANTAR 10/14/2007   Qualifier: Diagnosis of  By: Linna Darner MD, Gwyndolyn Saxon    . GERD (gastroesophageal reflux disease)   . Hyperlipidemia   . Osteopenia 2015   T score -1.1  . Thyroid disease    hyperthyroid-RA I    Past Surgical History:  Procedure Laterality Date  . COLONOSCOPY  2005    Dr Sharlett Iles, negative  . INTRAMEDULLARY (IM) NAIL INTERTROCHANTERIC Left 11/18/2017   Procedure: INTRAMEDULLARY (IM) NAIL INTERTROCHANTRIC;  Surgeon: Dorna Leitz, MD;  Location: [facility];  Service: Orthopedics;  Laterality: Left;  . PLANTAR FASCIA SURGERY  08/2010  . TONSILLECTOMY      Social History   Socioeconomic History  . Marital status: Single  Spouse name: Not on file  . Number of children: Not on file  . Years of education: Not on file  . Highest education level: Not on file  Occupational History  . Not on file  Tobacco Use  . Smoking status: Never Smoker  . Smokeless tobacco: Never Used  Substance and Sexual Activity  . Alcohol use: Yes    Alcohol/week: 1.0 standard drink    Types: 1 Standard drinks or equivalent per week    Comment: occasionally  . Drug use: No  . Sexual activity: Never    Birth control/protection: Abstinence, Post-menopausal    Comment: Virgin  Other  Topics Concern  . Not on file  Social History Narrative  . Not on file   Social Determinants of Health   Financial Resource Strain: Low Risk   . Difficulty of Paying Living Expenses: Not hard at all  Food Insecurity: No Food Insecurity  . Worried About Charity fundraiser in the Last Year: Never true  . Ran Out of Food in the Last Year: Never true  Transportation Needs: No Transportation Needs  . Lack of Transportation (Medical): No  . Lack of Transportation (Non-Medical): No  Physical Activity: Sufficiently Active  . Days of Exercise per Week: 5 days  . Minutes of Exercise per Session: 30 min  Stress: No Stress Concern Present  . Feeling of Stress : Not at all  Social Connections: Socially Integrated  . Frequency of Communication with Friends and Family: More than three times a week  . Frequency of Social Gatherings with Friends and Family: Once a week  . Attends Religious Services: More than 4 times per year  . Active Member of Clubs or Organizations: Yes  . Attends Archivist Meetings: More than 4 times per year  . Marital Status: Married    Family History  Problem Relation Age of Onset  . Diabetes Mother   . Kidney disease Mother   . Hypertension Mother   . Cancer Father        Pancreatic  . Breast cancer Paternal Aunt        Age 42's  . Cancer Maternal Uncle        melanoma  . Diabetes Maternal Uncle   . Colon cancer Neg Hx   . Esophageal cancer Neg Hx   . Stomach cancer Neg Hx   . Rectal cancer Neg Hx     Review of Systems  Constitutional: Negative for chills and fever.  Eyes: Negative for visual disturbance.  Respiratory: Negative for cough, shortness of breath and wheezing.   Cardiovascular: Negative for chest pain, palpitations and leg swelling.  Gastrointestinal: Negative for abdominal pain, blood in stool, constipation, diarrhea and nausea.  Genitourinary: Negative for dysuria.  Musculoskeletal: Negative for arthralgias and back pain.  Skin:  Negative for rash.  Neurological: Negative for dizziness, light-headedness and headaches.  Psychiatric/Behavioral: Negative for dysphoric mood. The patient is not nervous/anxious.        Objective:   Vitals:   05/20/20 0823  BP: 120/82  Pulse: 70  Temp: 98.4 F (36.9 C)  SpO2: 98%   Filed Weights   05/20/20 0823  Weight: 173 lb (78.5 kg)   Body mass index is 28.79 kg/m.  BP Readings from Last 3 Encounters:  05/20/20 120/82  04/14/19 (!) 148/84  03/14/19 (!) 150/92    Wt Readings from Last 3 Encounters:  05/20/20 173 lb (78.5 kg)  04/14/19 188 lb (85.3 kg)  03/14/19 186 lb (84.4  kg)     Physical Exam Constitutional: She appears well-developed and well-nourished. No distress.  HENT:  Head: Normocephalic and atraumatic.  Right Ear: External ear normal. Normal ear canal and TM Left Ear: External ear normal.  Normal ear canal and TM Mouth/Throat: Oropharynx is clear and moist.  Eyes: Conjunctivae and EOM are normal.  Neck: Neck supple. No tracheal deviation present. No thyromegaly present.  No carotid bruit  Cardiovascular: Normal rate, regular rhythm and normal heart sounds.   No murmur heard.  No edema. Pulmonary/Chest: Effort normal and breath sounds normal. No respiratory distress. She has no wheezes. She has no rales.  Breast: deferred   Abdominal: Soft. She exhibits no distension. There is no tenderness.  Lymphadenopathy: She has no cervical adenopathy.  Skin: Skin is warm and dry. She is not diaphoretic.  Psychiatric: She has a normal mood and affect. Her behavior is normal.        Assessment & Plan:   Physical exam: Screening blood work    ordered Immunizations  Had 2nd covid booster, shingrix Colonoscopy  Up to date  Mammogram  Up to date  Gyn  n/a Dexa  Up to date - due later this year Eye exams  Up to date  Exercise  Regular  Weight  Ok  Substance abuse  none      See Problem List for Assessment and Plan of chronic medical  problems.

## 2020-05-20 ENCOUNTER — Ambulatory Visit (INDEPENDENT_AMBULATORY_CARE_PROVIDER_SITE_OTHER): Payer: Medicare PPO | Admitting: Internal Medicine

## 2020-05-20 ENCOUNTER — Other Ambulatory Visit: Payer: Self-pay

## 2020-05-20 VITALS — BP 120/82 | HR 70 | Temp 98.4°F | Ht 65.0 in | Wt 173.0 lb

## 2020-05-20 DIAGNOSIS — E890 Postprocedural hypothyroidism: Secondary | ICD-10-CM

## 2020-05-20 DIAGNOSIS — M810 Age-related osteoporosis without current pathological fracture: Secondary | ICD-10-CM | POA: Diagnosis not present

## 2020-05-20 DIAGNOSIS — K219 Gastro-esophageal reflux disease without esophagitis: Secondary | ICD-10-CM | POA: Diagnosis not present

## 2020-05-20 DIAGNOSIS — Z833 Family history of diabetes mellitus: Secondary | ICD-10-CM | POA: Diagnosis not present

## 2020-05-20 DIAGNOSIS — I10 Essential (primary) hypertension: Secondary | ICD-10-CM | POA: Diagnosis not present

## 2020-05-20 DIAGNOSIS — F419 Anxiety disorder, unspecified: Secondary | ICD-10-CM | POA: Diagnosis not present

## 2020-05-20 DIAGNOSIS — Z0000 Encounter for general adult medical examination without abnormal findings: Secondary | ICD-10-CM

## 2020-05-20 DIAGNOSIS — S46312D Strain of muscle, fascia and tendon of triceps, left arm, subsequent encounter: Secondary | ICD-10-CM | POA: Diagnosis not present

## 2020-05-20 DIAGNOSIS — E7849 Other hyperlipidemia: Secondary | ICD-10-CM | POA: Diagnosis not present

## 2020-05-20 DIAGNOSIS — S46212D Strain of muscle, fascia and tendon of other parts of biceps, left arm, subsequent encounter: Secondary | ICD-10-CM | POA: Diagnosis not present

## 2020-05-20 DIAGNOSIS — E559 Vitamin D deficiency, unspecified: Secondary | ICD-10-CM | POA: Diagnosis not present

## 2020-05-20 DIAGNOSIS — F32A Depression, unspecified: Secondary | ICD-10-CM

## 2020-05-20 LAB — CBC WITH DIFFERENTIAL/PLATELET
Basophils Absolute: 0 10*3/uL (ref 0.0–0.1)
Basophils Relative: 0.5 % (ref 0.0–3.0)
Eosinophils Absolute: 0.1 10*3/uL (ref 0.0–0.7)
Eosinophils Relative: 0.9 % (ref 0.0–5.0)
HCT: 44.2 % (ref 36.0–46.0)
Hemoglobin: 14.8 g/dL (ref 12.0–15.0)
Lymphocytes Relative: 17.8 % (ref 12.0–46.0)
Lymphs Abs: 1.5 10*3/uL (ref 0.7–4.0)
MCHC: 33.6 g/dL (ref 30.0–36.0)
MCV: 94.5 fl (ref 78.0–100.0)
Monocytes Absolute: 0.6 10*3/uL (ref 0.1–1.0)
Monocytes Relative: 7.4 % (ref 3.0–12.0)
Neutro Abs: 6.3 10*3/uL (ref 1.4–7.7)
Neutrophils Relative %: 73.4 % (ref 43.0–77.0)
Platelets: 342 10*3/uL (ref 150.0–400.0)
RBC: 4.67 Mil/uL (ref 3.87–5.11)
RDW: 12.7 % (ref 11.5–15.5)
WBC: 8.5 10*3/uL (ref 4.0–10.5)

## 2020-05-20 LAB — COMPREHENSIVE METABOLIC PANEL
ALT: 18 U/L (ref 0–35)
AST: 18 U/L (ref 0–37)
Albumin: 4.6 g/dL (ref 3.5–5.2)
Alkaline Phosphatase: 55 U/L (ref 39–117)
BUN: 17 mg/dL (ref 6–23)
CO2: 30 mEq/L (ref 19–32)
Calcium: 9.3 mg/dL (ref 8.4–10.5)
Chloride: 102 mEq/L (ref 96–112)
Creatinine, Ser: 0.73 mg/dL (ref 0.40–1.20)
GFR: 84.41 mL/min (ref >60.00)
Glucose, Bld: 86 mg/dL (ref 70–99)
Potassium: 5.1 mEq/L (ref 3.5–5.1)
Sodium: 138 mEq/L (ref 135–145)
Total Bilirubin: 0.7 mg/dL (ref 0.2–1.2)
Total Protein: 7.4 g/dL (ref 6.0–8.3)

## 2020-05-20 LAB — LIPID PANEL
Cholesterol: 140 mg/dL (ref 0–200)
HDL: 40.3 mg/dL (ref >39.00)
LDL Cholesterol: 66 mg/dL (ref 0–99)
NonHDL: 100.06
Total CHOL/HDL Ratio: 3
Triglycerides: 171 mg/dL — ABNORMAL HIGH (ref 0.0–149.0)
VLDL: 34.2 mg/dL (ref 0.0–40.0)

## 2020-05-20 LAB — TSH: TSH: 2 u[IU]/mL (ref 0.35–4.50)

## 2020-05-20 LAB — HEMOGLOBIN A1C: Hgb A1c MFr Bld: 5.7 % (ref 4.6–6.5)

## 2020-05-20 LAB — VITAMIN D 25 HYDROXY (VIT D DEFICIENCY, FRACTURES): VITD: 27.06 ng/mL — ABNORMAL LOW (ref 30.00–100.00)

## 2020-05-20 NOTE — Assessment & Plan Note (Signed)
Chronic BP well controlled Continue losartan 25 mg daily cmp  

## 2020-05-20 NOTE — Assessment & Plan Note (Signed)
Chronic  Clinically euthyroid Currently taking levothyroxine 125 mcg 4/week, 62.5 mcg ROW Check tsh  Titrate med dose if needed

## 2020-05-20 NOTE — Assessment & Plan Note (Signed)
Chronic GERD controlled Continue omeprazole 40 mg daily 

## 2020-05-20 NOTE — Assessment & Plan Note (Signed)
Chronic Check a1c 

## 2020-05-20 NOTE — Assessment & Plan Note (Signed)
Chronic Check lipid panel  Continue pravastatin 40 mg daily Regular exercise and healthy diet encouraged  

## 2020-05-20 NOTE — Assessment & Plan Note (Signed)
Chronic dexa up to date - due later this year Continue fosamax 70 mg Q week 2021 Taking vitamin d - check level Continue regular exercise

## 2020-05-20 NOTE — Assessment & Plan Note (Signed)
Chronic Controlled, stable Continue lexapro 20 mg daily  

## 2020-05-20 NOTE — Assessment & Plan Note (Signed)
Chronic Taking vitamin D daily Check vitamin D level  

## 2020-05-25 DIAGNOSIS — R921 Mammographic calcification found on diagnostic imaging of breast: Secondary | ICD-10-CM | POA: Diagnosis not present

## 2020-05-25 LAB — HM MAMMOGRAPHY

## 2020-05-27 ENCOUNTER — Encounter: Payer: Self-pay | Admitting: Internal Medicine

## 2020-06-01 DIAGNOSIS — S46312D Strain of muscle, fascia and tendon of triceps, left arm, subsequent encounter: Secondary | ICD-10-CM | POA: Diagnosis not present

## 2020-06-01 DIAGNOSIS — S46212D Strain of muscle, fascia and tendon of other parts of biceps, left arm, subsequent encounter: Secondary | ICD-10-CM | POA: Diagnosis not present

## 2020-06-03 DIAGNOSIS — S46212D Strain of muscle, fascia and tendon of other parts of biceps, left arm, subsequent encounter: Secondary | ICD-10-CM | POA: Diagnosis not present

## 2020-06-03 DIAGNOSIS — S46312D Strain of muscle, fascia and tendon of triceps, left arm, subsequent encounter: Secondary | ICD-10-CM | POA: Diagnosis not present

## 2020-06-11 ENCOUNTER — Other Ambulatory Visit: Payer: Self-pay | Admitting: Internal Medicine

## 2020-06-11 DIAGNOSIS — E890 Postprocedural hypothyroidism: Secondary | ICD-10-CM

## 2020-06-17 DIAGNOSIS — M7701 Medial epicondylitis, right elbow: Secondary | ICD-10-CM | POA: Diagnosis not present

## 2020-06-21 ENCOUNTER — Other Ambulatory Visit: Payer: Self-pay | Admitting: Orthopedic Surgery

## 2020-06-21 DIAGNOSIS — M25522 Pain in left elbow: Secondary | ICD-10-CM

## 2020-07-04 ENCOUNTER — Other Ambulatory Visit: Payer: Self-pay

## 2020-07-04 ENCOUNTER — Ambulatory Visit
Admission: RE | Admit: 2020-07-04 | Discharge: 2020-07-04 | Disposition: A | Discharge status: Home / Self Care | Payer: Medicare PPO | Source: Ambulatory Visit | Attending: Orthopedic Surgery | Admitting: Orthopedic Surgery

## 2020-07-04 DIAGNOSIS — M25522 Pain in left elbow: Secondary | ICD-10-CM

## 2020-07-04 DIAGNOSIS — G8929 Other chronic pain: Secondary | ICD-10-CM | POA: Diagnosis not present

## 2020-07-06 DIAGNOSIS — M7712 Lateral epicondylitis, left elbow: Secondary | ICD-10-CM | POA: Diagnosis not present

## 2020-07-06 DIAGNOSIS — M25522 Pain in left elbow: Secondary | ICD-10-CM | POA: Diagnosis not present

## 2020-07-07 ENCOUNTER — Other Ambulatory Visit: Payer: Self-pay | Admitting: Internal Medicine

## 2020-07-16 DIAGNOSIS — H25813 Combined forms of age-related cataract, bilateral: Secondary | ICD-10-CM | POA: Diagnosis not present

## 2020-07-16 DIAGNOSIS — H04123 Dry eye syndrome of bilateral lacrimal glands: Secondary | ICD-10-CM | POA: Diagnosis not present

## 2020-07-16 DIAGNOSIS — H21553 Recession of chamber angle, bilateral: Secondary | ICD-10-CM | POA: Diagnosis not present

## 2020-07-16 DIAGNOSIS — H524 Presbyopia: Secondary | ICD-10-CM | POA: Diagnosis not present

## 2020-07-16 DIAGNOSIS — H52223 Regular astigmatism, bilateral: Secondary | ICD-10-CM | POA: Diagnosis not present

## 2020-07-16 DIAGNOSIS — H5203 Hypermetropia, bilateral: Secondary | ICD-10-CM | POA: Diagnosis not present

## 2020-08-02 ENCOUNTER — Other Ambulatory Visit: Payer: Self-pay

## 2020-08-02 ENCOUNTER — Telehealth: Payer: Self-pay

## 2020-08-02 ENCOUNTER — Ambulatory Visit: Payer: Medicare PPO | Admitting: Family Medicine

## 2020-08-02 ENCOUNTER — Encounter: Payer: Self-pay | Admitting: Internal Medicine

## 2020-08-02 VITALS — HR 93 | Temp 98.2°F | Wt 176.0 lb

## 2020-08-02 DIAGNOSIS — N3000 Acute cystitis without hematuria: Secondary | ICD-10-CM | POA: Diagnosis not present

## 2020-08-02 LAB — POCT URINALYSIS DIPSTICK
Bilirubin, UA: NEGATIVE
Blood, UA: POSITIVE
Glucose, UA: NEGATIVE
Ketones, UA: NEGATIVE
Nitrite, UA: NEGATIVE
Protein, UA: NEGATIVE
Spec Grav, UA: <=1.005 — AB (ref 1.010–1.025)
Urobilinogen, UA: 0.2 E.U./dL
pH, UA: 6 (ref 5.0–8.0)

## 2020-08-02 MED ORDER — PHENAZOPYRIDINE HCL 200 MG PO TABS: 200.0000 mg | ORAL_TABLET | Freq: Three times a day (TID) | ORAL | 0 refills | Status: DC | PRN

## 2020-08-02 MED ORDER — NITROFURANTOIN MONOHYD MACRO 100 MG PO CAPS: 100.0000 mg | ORAL_CAPSULE | Freq: Two times a day (BID) | ORAL | 0 refills | Status: DC

## 2020-08-02 NOTE — Progress Notes (Signed)
Subjective   CC:  Chief Complaint  Patient presents with   Urinary Tract Infection    Frequent urination-slight burn-symptoms worsening-bubbly urine   Same day acute visit; PCP not available. New pt to me. Chart reviewed.   HPI: Holly Cochran is a 70 y.o. female who presents to the office today to address the problems listed above in the chief complaint. Patient reports dysuria and urinary frequency.  She has sensation of increased urinary pressure.  She denies fevers flank pain nausea vomiting or gross hematuria.  Symptoms have been present since this am. Had white foam in urine this am.  She denies history of interstitial cystitis.  She denies vaginal symptoms including vaginal discharge or pelvic pain. No h/o recurrent UTI or pyelonephritis.   Assessment  1. Acute cystitis without hematuria      Plan  Cystitis, acute. Macrobid bid x 5 days and await culture. Pyridium for irritative sxs. Red flags discussed. F/u if not improving.  Follow up: prn  Orders Placed This Encounter  Procedures   Urine Culture   POCT Urinalysis Dipstick   Meds ordered this encounter  Medications   nitrofurantoin, macrocrystal-monohydrate, (MACROBID) 100 MG capsule    Sig: Take 1 capsule (100 mg total) by mouth 2 (two) times daily.    Dispense:  10 capsule    Refill:  0   phenazopyridine (PYRIDIUM) 200 MG tablet    Sig: Take 1 tablet (200 mg total) by mouth 3 (three) times daily as needed (urinary symptoms).    Dispense:  10 tablet    Refill:  0      I reviewed the patients updated PMH, FH, and SocHx.    Patient Active Problem List   Diagnosis Date Noted   TMJ arthralgia 04/14/2019   Essential hypertension 03/15/2019   Intertrochanteric fracture of left femur ([place]) 11/18/2017   Vitamin D deficiency 08/31/2016   Family history of diabetes mellitus in mother 01/30/2016   Anxiety and depression 03/02/2015   Chronically dry eyes 07/20/2014   Common peroneal neuropathy of left lower  extremity 05/20/2014   GERD (gastroesophageal reflux disease) 02/08/2011   RHINITIS 11/24/2008   GANGLION OF TENDON SHEATH 11/10/2008   Hyperlipidemia 08/27/2007   Osteoporosis 08/27/2007   Hypothyroidism, postradioiodine therapy 06/01/2006   Current Meds  Medication Sig   alendronate (FOSAMAX) 70 MG tablet Take 1 tablet (70 mg total) by mouth every 7 (seven) days. Take with a full glass of water on an empty stomach.   Cholecalciferol (VITAMIN D3) 25 MCG (1000 UT) CAPS Take 1,000 Units by mouth daily.   escitalopram (LEXAPRO) 20 MG tablet TAKE 1 TABLET ONCE DAILY.   fish oil-omega-3 fatty acids 1000 MG capsule Take 2 g by mouth daily.   levothyroxine (SYNTHROID) 125 MCG tablet TAKE 1 TABLET ONCE DAILY EXCEPT TAKE 1/2 TABLET ON TUESDAY, THURSDAY AND SATURDAY.   losartan (COZAAR) 25 MG tablet TAKE 1 TABLET ONCE DAILY.   meloxicam (MOBIC) 15 MG tablet Take 1 tablet (15 mg total) by mouth daily.   methocarbamol (ROBAXIN) 500 MG tablet TAKE 1 TABLET EVERY 6 HOURS AS NEEDED FOR MUSCLE SPASM.   nitrofurantoin, macrocrystal-monohydrate, (MACROBID) 100 MG capsule Take 1 capsule (100 mg total) by mouth 2 (two) times daily.   omeprazole (PRILOSEC) 40 MG capsule TAKE 1 CAPSULE ONCE DAILY 30 MINUTES BEFORE A MEAL.   phenazopyridine (PYRIDIUM) 200 MG tablet Take 1 tablet (200 mg total) by mouth 3 (three) times daily as needed (urinary symptoms).   pravastatin (PRAVACHOL)  40 MG tablet TAKE (1) TABLET DAILY AT BEDTIME.   tiZANidine (ZANAFLEX) 4 MG tablet Take 4 mg by mouth every 8 (eight) hours as needed.    Review of Systems: Cardiovascular: negative for chest pain Respiratory: negative for SOB or persistent cough Gastrointestinal: negative for abdominal pain Constitutional: Negative for fever malaise or anorexia  Objective  Vitals: Pulse 93   Temp 98.2 F (36.8 C)   Wt 176 lb (79.8 kg)   LMP 01/09/2005   SpO2 97%   BMI 29.29 kg/m  General: no acute distress  Psych:  Alert and oriented,  normal mood and affect Gastrointestinal: soft, flat abdomen, normal active bowel sounds, no palpable masses, no hepatosplenomegaly, no appreciated hernias, NO CVAT, mild suprapubic ttp w/o rebound or guarding  Office Visit on 08/02/2020  Component Date Value Ref Range Status   Color, UA 08/02/2020 yellow   Final   Clarity, UA 08/02/2020 little cloudy   Final   Glucose, UA 08/02/2020 Negative  Negative Final   Bilirubin, UA 08/02/2020 neg   Final   Ketones, UA 08/02/2020 neg   Final   Spec Grav, UA 08/02/2020 <=1.005 (A) 1.010 - 1.025 Final   Blood, UA 08/02/2020 3+ positive   Final   pH, UA 08/02/2020 6.0  5.0 - 8.0 Final   Protein, UA 08/02/2020 Negative  Negative Final   Urobilinogen, UA 08/02/2020 0.2  0.2 or 1.0 E.U./dL Final   Nitrite, UA 08/02/2020 neg   Final   Leukocytes, UA 08/02/2020 Small (1+) (A) Negative Final   Commons side effects, risks, benefits, and alternatives for medications and treatment plan prescribed today were discussed, and the patient expressed understanding of the given instructions. Patient is instructed to call or message via MyChart if he/she has any questions or concerns regarding our treatment plan. No barriers to understanding were identified. We discussed Red Flag symptoms and signs in detail. Patient expressed understanding regarding what to do in case of urgent or emergency type symptoms.  Medication list was reconciled, printed and provided to the patient in AVS. Patient instructions and summary information was reviewed with the patient as documented in the AVS. This note was prepared with assistance of Dragon voice recognition software. Occasional wrong-word or sound-a-like substitutions may have occurred due to the inherent limitations of voice recognition software

## 2020-08-02 NOTE — Patient Instructions (Signed)
Please follow up if symptoms do not improve or as needed.    You may purchase AZO otc if your insurance does not cover the pyridium. Use this for urgency and bladder spasm.  Take the antibiotics as prescribed.  I will let you know about your culture results when they return via mychart.   Urinary Tract Infection, Adult  A urinary tract infection (UTI) is an infection of any part of the urinary tract. The urinary tract includes the kidneys, ureters, bladder, and urethra.These organs make, store, and get rid of urine in the body. An upper UTI affects the ureters and kidneys. A lower UTI affects the bladderand urethra. What are the causes? Most urinary tract infections are caused by bacteria in your genital area around your urethra, where urine leaves your body. These bacteria grow andcause inflammation of your urinary tract. What increases the risk? You are more likely to develop this condition if: You have a urinary catheter that stays in place. You are not able to control when you urinate or have a bowel movement (incontinence). You are female and you: Use a spermicide or diaphragm for birth control. Have low estrogen levels. Are pregnant. You have certain genes that increase your risk. You are sexually active. You take antibiotic medicines. You have a condition that causes your flow of urine to slow down, such as: An enlarged prostate, if you are female. Blockage in your urethra. A kidney stone. A nerve condition that affects your bladder control (neurogenic bladder). Not getting enough to drink, or not urinating often. You have certain medical conditions, such as: Diabetes. A weak disease-fighting system (immunesystem). Sickle cell disease. Gout. Spinal cord injury. What are the signs or symptoms? Symptoms of this condition include: Needing to urinate right away (urgency). Frequent urination. This may include small amounts of urine each time you urinate. Pain or burning with  urination. Blood in the urine. Urine that smells bad or unusual. Trouble urinating. Cloudy urine. Vaginal discharge, if you are female. Pain in the abdomen or the lower back. You may also have: Vomiting or a decreased appetite. Confusion. Irritability or tiredness. A fever or chills. Diarrhea. The first symptom in older adults may be confusion. In some cases, they may nothave any symptoms until the infection has worsened. How is this diagnosed? This condition is diagnosed based on your medical history and a physical exam. You may also have other tests, including: Urine tests. Blood tests. Tests for STIs (sexually transmitted infections). If you have had more than one UTI, a cystoscopy or imaging studies may be doneto determine the cause of the infections. How is this treated? Treatment for this condition includes: Antibiotic medicine. Over-the-counter medicines to treat discomfort. Drinking enough water to stay hydrated. If you have frequent infections or have other conditions such as a kidney stone, you may need to see a health care provider who specializes in the urinary tract (urologist). In rare cases, urinary tract infections can cause sepsis. Sepsis is a life-threatening condition that occurs when the body responds to an infection. Sepsis is treated in the hospital with IV antibiotics, fluids, and othermedicines. Follow these instructions at home:  Medicines Take over-the-counter and prescription medicines only as told by your health care provider. If you were prescribed an antibiotic medicine, take it as told by your health care provider. Do not stop using the antibiotic even if you start to feel better. General instructions Make sure you: Empty your bladder often and completely. Do not hold urine for long periods  of time. Empty your bladder after sex. Wipe from front to back after urinating or having a bowel movement if you are female. Use each tissue only one time when  you wipe. Drink enough fluid to keep your urine pale yellow. Keep all follow-up visits. This is important. Contact a health care provider if: Your symptoms do not get better after 1-2 days. Your symptoms go away and then return. Get help right away if: You have severe pain in your back or your lower abdomen. You have a fever or chills. You have nausea or vomiting. Summary A urinary tract infection (UTI) is an infection of any part of the urinary tract, which includes the kidneys, ureters, bladder, and urethra. Most urinary tract infections are caused by bacteria in your genital area. Treatment for this condition often includes antibiotic medicines. If you were prescribed an antibiotic medicine, take it as told by your health care provider. Do not stop using the antibiotic even if you start to feel better. Keep all follow-up visits. This is important. This information is not intended to replace advice given to you by your health care provider. Make sure you discuss any questions you have with your healthcare provider. Document Revised: 08/08/2019 Document Reviewed: 08/08/2019 Elsevier Patient Education  [employer] [place].

## 2020-08-02 NOTE — Telephone Encounter (Signed)
pt has stated  "I woke up and I went to the bathroom and I felt like I was not emptying my bladder but more concerning is there was white stuff in my urine. When I first got up this morning there were still white stuff in my urine. I drank a bottle of water and I went back to the bathroom again and there was a lot less white stuff in my urine. I have been working outside a lot during this heat and I am not sure I've taking in enough water".  **Pt would like a call back to what she needs to do next.

## 2020-08-05 LAB — URINE CULTURE
MICRO NUMBER:: 12159328
SPECIMEN QUALITY:: ADEQUATE

## 2020-08-06 ENCOUNTER — Other Ambulatory Visit: Payer: Self-pay | Admitting: Family Medicine

## 2020-08-06 MED ORDER — NITROFURANTOIN MONOHYD MACRO 100 MG PO CAPS: 100.0000 mg | ORAL_CAPSULE | Freq: Two times a day (BID) | ORAL | 0 refills | Status: DC

## 2020-08-06 NOTE — Telephone Encounter (Signed)
Pt called: feeling much better but still with increased pressure and urinary frequency.   Will refill macrobid x 5d. If persists, rec recheck urine.   No visits with results within 1 Day(s) from this visit.  Latest known visit with results is:  Office Visit on 08/02/2020  Component Date Value Ref Range Status   MICRO NUMBER: 08/02/2020 [id number]   Final   SPECIMEN QUALITY: 08/02/2020 Adequate   Final   Sample Source 08/02/2020 NOT GIVEN   Final   STATUS: 08/02/2020 FINAL   Final   ISOLATE 1: 08/02/2020 Escherichia coli (A)  Final   Color, UA 08/02/2020 yellow   Final   Clarity, UA 08/02/2020 little cloudy   Final   Glucose, UA 08/02/2020 Negative  Negative Final   Bilirubin, UA 08/02/2020 neg   Final   Ketones, UA 08/02/2020 neg   Final   Spec Grav, UA 08/02/2020 <=1.005 (A) 1.010 - 1.025 Final   Blood, UA 08/02/2020 3+ positive   Final   pH, UA 08/02/2020 6.0  5.0 - 8.0 Final   Protein, UA 08/02/2020 Negative  Negative Final   Urobilinogen, UA 08/02/2020 0.2  0.2 or 1.0 E.U./dL Final   Nitrite, UA 08/02/2020 neg   Final   Leukocytes, UA 08/02/2020 Small (1+) (A) Negative Final

## 2020-08-23 ENCOUNTER — Other Ambulatory Visit: Payer: Self-pay

## 2020-08-23 ENCOUNTER — Other Ambulatory Visit: Payer: Medicare PPO

## 2020-08-23 ENCOUNTER — Telehealth: Payer: Self-pay

## 2020-08-23 ENCOUNTER — Other Ambulatory Visit (INDEPENDENT_AMBULATORY_CARE_PROVIDER_SITE_OTHER): Payer: Medicare PPO

## 2020-08-23 ENCOUNTER — Other Ambulatory Visit: Payer: Self-pay | Admitting: Internal Medicine

## 2020-08-23 DIAGNOSIS — R300 Dysuria: Secondary | ICD-10-CM

## 2020-08-23 LAB — POC URINALSYSI DIPSTICK (AUTOMATED)
Blood, UA: NEGATIVE
Glucose, UA: NEGATIVE
Ketones, UA: NEGATIVE
Nitrite, UA: POSITIVE
Protein, UA: NEGATIVE
Spec Grav, UA: 1.015 (ref 1.010–1.025)
Urobilinogen, UA: 2 E.U./dL — AB
pH, UA: 6 (ref 5.0–8.0)

## 2020-08-23 MED ORDER — NITROFURANTOIN MONOHYD MACRO 100 MG PO CAPS: 100.0000 mg | ORAL_CAPSULE | Freq: Two times a day (BID) | ORAL | 0 refills | Status: DC

## 2020-08-23 NOTE — Telephone Encounter (Signed)
pt was tx'd for a UTI on 08/02/2020. UTI did clear up some was rx'd  Macrobid to take for an additional 2 days ONLY per Dr. Jonni Sanger. Pt states she feels she needed to take meds longer as it was helping and she noticed a difference. Now she is experiencing frequent urination, some burning, pressure and discomfort all over again and is wanting a rx for Macrobid again. Pt is taking AZO as well to help her but it is not doing the job. Pt was informed there are appointments available for her to come in tomorrow to see Dr. Quay Burow. She stated she would like a message sent to her about getting meds today. If she has to come in she will.

## 2020-08-23 NOTE — Telephone Encounter (Signed)
Urine dip pos for uti-  message sent to patient via mychart

## 2020-08-24 ENCOUNTER — Ambulatory Visit: Payer: Medicare PPO | Admitting: Internal Medicine

## 2020-08-24 ENCOUNTER — Other Ambulatory Visit: Payer: Self-pay

## 2020-08-26 LAB — CULTURE, URINE COMPREHENSIVE
MICRO NUMBER:: 12242868
RESULT:: NO GROWTH
SPECIMEN QUALITY:: ADEQUATE

## 2020-09-01 ENCOUNTER — Other Ambulatory Visit: Payer: Self-pay | Admitting: Internal Medicine

## 2020-09-01 DIAGNOSIS — E890 Postprocedural hypothyroidism: Secondary | ICD-10-CM

## 2020-09-06 DIAGNOSIS — M25522 Pain in left elbow: Secondary | ICD-10-CM | POA: Diagnosis not present

## 2020-09-08 ENCOUNTER — Other Ambulatory Visit: Payer: Self-pay | Admitting: Internal Medicine

## 2020-09-12 DIAGNOSIS — S90861A Insect bite (nonvenomous), right foot, initial encounter: Secondary | ICD-10-CM | POA: Diagnosis not present

## 2020-09-12 DIAGNOSIS — W57XXXA Bitten or stung by nonvenomous insect and other nonvenomous arthropods, initial encounter: Secondary | ICD-10-CM | POA: Diagnosis not present

## 2020-09-14 DIAGNOSIS — M79602 Pain in left arm: Secondary | ICD-10-CM | POA: Diagnosis not present

## 2020-09-27 ENCOUNTER — Other Ambulatory Visit: Payer: Self-pay

## 2020-09-27 ENCOUNTER — Ambulatory Visit: Payer: Medicare PPO | Admitting: Dermatology

## 2020-09-27 ENCOUNTER — Other Ambulatory Visit: Payer: Self-pay | Admitting: Internal Medicine

## 2020-09-27 DIAGNOSIS — Z1283 Encounter for screening for malignant neoplasm of skin: Secondary | ICD-10-CM

## 2020-09-27 DIAGNOSIS — L814 Other melanin hyperpigmentation: Secondary | ICD-10-CM | POA: Diagnosis not present

## 2020-09-27 DIAGNOSIS — L821 Other seborrheic keratosis: Secondary | ICD-10-CM | POA: Diagnosis not present

## 2020-09-27 DIAGNOSIS — Z808 Family history of malignant neoplasm of other organs or systems: Secondary | ICD-10-CM | POA: Diagnosis not present

## 2020-09-27 DIAGNOSIS — R238 Other skin changes: Secondary | ICD-10-CM | POA: Diagnosis not present

## 2020-09-27 DIAGNOSIS — D1801 Hemangioma of skin and subcutaneous tissue: Secondary | ICD-10-CM

## 2020-09-27 DIAGNOSIS — D485 Neoplasm of uncertain behavior of skin: Secondary | ICD-10-CM

## 2020-09-27 DIAGNOSIS — B079 Viral wart, unspecified: Secondary | ICD-10-CM

## 2020-09-27 NOTE — Patient Instructions (Signed)

## 2020-10-05 DIAGNOSIS — M79602 Pain in left arm: Secondary | ICD-10-CM | POA: Diagnosis not present

## 2020-10-10 ENCOUNTER — Encounter: Payer: Self-pay | Admitting: Dermatology

## 2020-10-10 NOTE — Progress Notes (Signed)
   New Patient   Subjective  Holly Cochran is a 70 y.o. female who presents for the following: New Patient (Initial Visit) (Patient here today for lesion inside her mouth x years no pain, no bleeding. Per patient she also has a lesion on her right post knee x 5-6 years no pain, no bleeding. Patient states that she also has a lesion on her right buttock x years no bleeding, no pain.  Patient also has a lesion on her chest that she would like removed. No personal history of atypical moles, melanoma or non mole skin cancer. Family history of melanoma (uncle)).  General skin examination, patient would like several areas specifically examined.  Location:  Duration:  Quality:  Associated Signs/Symptoms: Modifying Factors:  Severity:  Timing: Context:    The following portions of the chart were reviewed this encounter and updated as appropriate:  Tobacco  Allergies  Meds  Problems  Med Hx  Surg Hx  Fam Hx      Objective  Well appearing patient in no apparent distress; mood and affect are within normal limits. Left Forearm - Anterior, Left Upper Back Brown textured flat 6 mm papules  Right Hip (side) - Posterior, Torso - Posterior (Back) Multiple 2 mm smooth red dermal papules  Head to Toe Head to toe exam today no signs of atypical moles or melanoma.  1 possible nonmelanoma skin cancer on chest will be biopsied and  Left Malar Cheek 5 mm light brown monochrome symmetric macule  Chest - Medial (Center) Hornlike verrucous 4 mm papule.         Left Mandibular Gingiva 5 mm soft purplish dermal papule, partly blanches with pressure.     A full examination was performed including scalp, head, eyes, ears, nose, lips, neck, chest, axillae, abdomen, back, buttocks, bilateral upper extremities, bilateral lower extremities, hands, feet, fingers, toes, fingernails, and toenails. All findings within normal limits unless otherwise noted below.  Areas beneath undergarment not  fully examined.     Assessment & Plan  Seborrheic keratosis (2) Left Forearm - Anterior; Left Upper Back  Benign okay to leave if stable  Hemangioma of skin (2) Right Hip (side) - Posterior; Torso - Posterior (Back)  Benign okay to leave.   Skin exam for malignant neoplasm Head to Toe  Yearly skin check; encouraged to self examine twice annually.  Continue ultraviolet protection.  Lentigo Left Malar Cheek  Recheck as needed change  Neoplasm of uncertain behavior of skin Chest - Medial (Center)  Skin / nail biopsy Type of biopsy: tangential   Informed consent: discussed and consent obtained   Timeout: patient name, date of birth, surgical site, and procedure verified   Procedure prep:  Patient was prepped and draped in usual sterile fashion (Non sterile) Prep type:  Chlorhexidine Anesthesia: the lesion was anesthetized in a standard fashion   Anesthetic:  1% lidocaine w/ epinephrine 1-100,000 local infiltration Instrument used: flexible razor blade   Hemostasis achieved with: ferric subsulfate and electrodesiccation   Outcome: patient tolerated procedure well   Post-procedure details: sterile dressing applied and wound care instructions given   Dressing type: bandage and petrolatum    Specimen 1 - Surgical pathology Differential Diagnosis: R/O Wart - cautery after biopsy  Check Margins: No  Venous lake Left Mandibular Gingiva  Benign okay to leave.  To see oral surgeon if she feels there is change.

## 2020-11-02 DIAGNOSIS — H524 Presbyopia: Secondary | ICD-10-CM | POA: Diagnosis not present

## 2020-11-02 DIAGNOSIS — H43813 Vitreous degeneration, bilateral: Secondary | ICD-10-CM | POA: Diagnosis not present

## 2020-11-02 DIAGNOSIS — H43393 Other vitreous opacities, bilateral: Secondary | ICD-10-CM | POA: Diagnosis not present

## 2020-11-02 DIAGNOSIS — H52223 Regular astigmatism, bilateral: Secondary | ICD-10-CM | POA: Diagnosis not present

## 2020-11-02 DIAGNOSIS — H5203 Hypermetropia, bilateral: Secondary | ICD-10-CM | POA: Diagnosis not present

## 2020-11-09 DIAGNOSIS — M79602 Pain in left arm: Secondary | ICD-10-CM | POA: Diagnosis not present

## 2020-11-11 ENCOUNTER — Encounter: Payer: Self-pay | Admitting: Internal Medicine

## 2020-11-18 MED ORDER — TIRZEPATIDE 2.5 MG/0.5ML ~~LOC~~ SOAJ: 2.5000 mg | SUBCUTANEOUS | 0 refills | Status: DC

## 2020-11-18 NOTE — Addendum Note (Signed)
Addended by: Binnie Rail on: 11/18/2020 05:41 AM   Modules accepted: Orders

## 2020-11-22 ENCOUNTER — Other Ambulatory Visit: Payer: Self-pay | Admitting: Internal Medicine

## 2020-11-22 DIAGNOSIS — E890 Postprocedural hypothyroidism: Secondary | ICD-10-CM

## 2020-12-10 ENCOUNTER — Other Ambulatory Visit: Payer: Self-pay | Admitting: Internal Medicine

## 2020-12-22 ENCOUNTER — Other Ambulatory Visit: Payer: Self-pay

## 2020-12-22 ENCOUNTER — Ambulatory Visit (INDEPENDENT_AMBULATORY_CARE_PROVIDER_SITE_OTHER): Payer: Medicare PPO

## 2020-12-22 DIAGNOSIS — Z0000 Encounter for general adult medical examination without abnormal findings: Secondary | ICD-10-CM | POA: Diagnosis not present

## 2020-12-22 NOTE — Progress Notes (Signed)
I connected with Holly Cochran today by telephone and verified that I am speaking with the correct person using two identifiers. Location patient: home Location provider: work Persons participating in the virtual visit: patient, provider.   I discussed the limitations, risks, security and privacy concerns of performing an evaluation and management service by telephone and the availability of in person appointments. I also discussed with the patient that there may be a patient responsible charge related to this service. The patient expressed understanding and verbally consented to this telephonic visit.    Interactive audio and video telecommunications were attempted between this provider and patient, however failed, due to patient having technical difficulties OR patient did not have access to video capability.  We continued and completed visit with audio only.  Some vital signs may be absent or patient reported.   Time Spent with patient on telephone encounter: 40 minutes  Subjective:   Holly Cochran is a 70 y.o. female who presents for Medicare Annual (Subsequent) preventive examination.  Review of Systems     Cardiac Risk Factors include: advanced age (>57men, >6 women);family history of premature cardiovascular disease;hypertension;dyslipidemia     Objective:    There were no vitals filed for this visit. There is no height or weight on file to calculate BMI.  Advanced Directives 12/22/2020 10/02/2019 11/18/2017 11/18/2017 11/18/2017 07/08/2014 06/24/2014  Does Patient Have a Medical Advance Directive? Yes Yes Yes Yes No No;Yes Yes  Type of Advance Directive Living will;Healthcare Power of Attorney Living will;Healthcare Power of Attorney Living will;Healthcare Power of [place];Living will  Does patient want to make changes to medical advance directive? No - Patient declined No - Patient declined No - Patient declined  - - - -  Copy of [place] in Chart? No - copy requested No - copy requested No - copy requested - - - -  Would patient like information on creating a medical advance directive? - - - - No - Patient declined - -    Current Medications (verified) Outpatient Encounter Medications as of 12/22/2020  Medication Sig   alendronate (FOSAMAX) 70 MG tablet TAKE 1 TABLET EVERY 7 DAYS. TAKE WITH A FULL GLASS OF WATER ON AN EMPTY STOMACH.   Cholecalciferol (VITAMIN D3) 25 MCG (1000 UT) CAPS Take 1,000 Units by mouth daily.   escitalopram (LEXAPRO) 20 MG tablet TAKE 1 TABLET ONCE DAILY.   levothyroxine (SYNTHROID) 125 MCG tablet TAKE ONE TABLET ONCE DAILY AND TAKE 1/2 TABLET ON TUESDAY, THURSDAY AND SATURDAY   losartan (COZAAR) 25 MG tablet TAKE 1 TABLET ONCE DAILY.   meloxicam (MOBIC) 15 MG tablet Take 1 tablet (15 mg total) by mouth daily.   methocarbamol (ROBAXIN) 500 MG tablet TAKE 1 TABLET EVERY 6 HOURS AS NEEDED FOR MUSCLE SPASM.   omeprazole (PRILOSEC) 40 MG capsule TAKE 1 CAPSULE ONCE DAILY 30 MINUTES BEFORE A MEAL.   pravastatin (PRAVACHOL) 40 MG tablet TAKE (1) TABLET DAILY AT BEDTIME.   tirzepatide (MOUNJARO) 2.5 MG/0.5ML Pen Inject 2.5 mg into the skin once a week.   tiZANidine (ZANAFLEX) 4 MG tablet Take 4 mg by mouth every 8 (eight) hours as needed.   No facility-administered encounter medications on file as of 12/22/2020.    Allergies (verified) Sulfasalazine   History: Past Medical History:  Diagnosis Date   Common peroneal neuropathy of left lower extremity 05/20/2014   FASCIITIS, PLANTAR 10/14/2007   Qualifier: Diagnosis of  By: Linna Darner MD,  William     GERD (gastroesophageal reflux disease)    Hyperlipidemia    Osteopenia 2015   T score -1.1   Thyroid disease    hyperthyroid-RA I   Past Surgical History:  Procedure Laterality Date   COLONOSCOPY  2005    Dr Sharlett Iles, negative   INTRAMEDULLARY (IM) NAIL INTERTROCHANTERIC Left 11/18/2017   Procedure:  INTRAMEDULLARY (IM) NAIL INTERTROCHANTRIC;  Surgeon: Dorna Leitz, MD;  Location: [facility];  Service: Orthopedics;  Laterality: Left;   PLANTAR FASCIA SURGERY  08/2010   TONSILLECTOMY     Family History  Problem Relation Age of Onset   Diabetes Mother    Kidney disease Mother    Hypertension Mother    Cancer Father        Pancreatic   Breast cancer Paternal Aunt        Age 72's   Cancer Maternal Uncle        melanoma   Diabetes Maternal Uncle    Colon cancer Neg Hx    Esophageal cancer Neg Hx    Stomach cancer Neg Hx    Rectal cancer Neg Hx    Social History   Socioeconomic History   Marital status: Single    Spouse name: Not on file   Number of children: Not on file   Years of education: Not on file   Highest education level: Not on file  Occupational History   Not on file  Tobacco Use   Smoking status: Never   Smokeless tobacco: Never  Substance and Sexual Activity   Alcohol use: Yes    Alcohol/week: 1.0 standard drink    Types: 1 Standard drinks or equivalent per week    Comment: occasionally   Drug use: No   Sexual activity: Never    Birth control/protection: Abstinence, Post-menopausal    Comment: Virgin  Other Topics Concern   Not on file  Social History Narrative   Not on file   Social Determinants of Health   Financial Resource Strain: Low Risk    Difficulty of Paying Living Expenses: Not hard at all  Food Insecurity: No Food Insecurity   Worried About Charity fundraiser in the Last Year: Never true   Arboriculturist in the Last Year: Never true  Transportation Needs: No Transportation Needs   Lack of Transportation (Medical): No   Lack of Transportation (Non-Medical): No  Physical Activity: Sufficiently Active   Days of Exercise per Week: 5 days   Minutes of Exercise per Session: 30 min  Stress: No Stress Concern Present   Feeling of Stress : Not at all  Social Connections: Socially Integrated   Frequency of Communication with Friends and  Family: More than three times a week   Frequency of Social Gatherings with Friends and Family: Once a week   Attends Religious Services: More than 4 times per year   Active Member of [employer] or Organizations: Yes   Attends Music therapist: More than 4 times per year   Marital Status: Married    Tobacco Counseling Counseling given: Not Answered   Clinical Intake:  Pre-visit preparation completed: Yes  Pain : No/denies pain     Nutritional Risks: None Diabetes: No  How often do you need to have someone help you when you read instructions, pamphlets, or other written materials from your doctor or pharmacy?: 1 - Never What is the last grade level you completed in school?: Master's Degree  Diabetic? no  Interpreter Needed?: No  Information entered by :: Lisette Abu, LPN   Activities of Daily Living In your present state of health, do you have any difficulty performing the following activities: 12/22/2020  Hearing? N  Vision? N  Difficulty concentrating or making decisions? N  Walking or climbing stairs? N  Dressing or bathing? N  Doing errands, shopping? N  Preparing Food and eating ? N  Using the Toilet? N  In the past six months, have you accidently leaked urine? N  Do you have problems with loss of bowel control? N  Managing your Medications? N  Managing your Finances? N  Housekeeping or managing your Housekeeping? N  Some recent data might be hidden    Patient Care Team: Binnie Rail, MD as PCP - General (Internal Medicine) Berle Mull, MD as Consulting Physician (Family Medicine) Lavonna Monarch, MD as Consulting Physician (Dermatology) Marica Otter, OD (Optometry)  Indicate any recent Medical Services you may have received from other than [facility] providers in the past year (date may be approximate).     Assessment:   This is a routine wellness examination for [place].  Hearing/Vision screen Hearing Screening - Comments:: Patient denied  any hearing difficulty. No hearing aids. Vision Screening - Comments:: Patient wears corrective glasses for reading only.  Eye exam done annually by: Marica Otter, OD.  Dietary issues and exercise activities discussed: Current Exercise Habits: Home exercise routine, Type of exercise: walking (Walks dog every day), Time (Minutes): 30, Frequency (Times/Week): 7, Weekly Exercise (Minutes/Week): 210, Intensity: Moderate, Exercise limited by: None identified   Goals Addressed               This Visit's Progress     Patient Stated (pt-stated)        My goal is to lose 5-6 pounds by being physically active and watching my diet.      Depression Screen PHQ 2/9 Scores 12/22/2020 10/02/2019 03/14/2019 01/03/2017 03/03/2016 12/24/2015  PHQ - 2 Score 0 0 0 0 0 0    Fall Risk Fall Risk  12/22/2020 10/02/2019 03/14/2019 01/03/2017 03/03/2016  Falls in the past year? 0 1 0 No No  Comment - broke both wrists - - -  Number falls in past yr: 0 0 0 - -  Injury with Fall? 0 1 1 - -  Risk for fall due to : No Fall Risks Other (Comment) - - -  Risk for fall due to: Comment - accident with shoe - - -  Follow up Falls prevention discussed Falls evaluation completed - - -    FALL RISK PREVENTION PERTAINING TO THE HOME:  Any stairs in or around the home? Yes  If so, are there any without handrails? No  Home free of loose throw rugs in walkways, pet beds, electrical cords, etc? Yes  Adequate lighting in your home to reduce risk of falls? Yes   ASSISTIVE DEVICES UTILIZED TO PREVENT FALLS:  Life alert? No  Use of a cane, walker or w/c? No  Grab bars in the bathroom? Yes  Shower chair or bench in shower? Yes  Elevated toilet seat or a handicapped toilet? Yes   TIMED UP AND GO:  Was the test performed? No .  Length of time to ambulate 10 feet: n/a sec.   Gait steady and fast without use of assistive device  Cognitive Function: Normal cognitive status assessed by direct observation by this Nurse  Health Advisor. No abnormalities found.          Immunizations Immunization History  Administered Date(s) Administered   Fluad Quad(high Dose 65+) 10/10/2018, 11/05/2018   Influenza, High Dose Seasonal PF 11/10/2016, 09/20/2020   Influenza, Seasonal, Injecte, Preservative Fre 11/03/2013   Influenza,inj,Quad PF,6+ Mos 11/19/2015   Influenza,inj,quad, With Preservative 12/02/2014   Influenza-Unspecified 09/09/2012, 12/02/2014, 10/10/2015   PFIZER(Purple Top)SARS-COV-2 Vaccination 01/30/2019, 02/20/2019   Pfizer Covid-19 Vaccine Bivalent Booster 32yrs & up 09/20/2020   Pneumococcal Conjugate-13 02/05/2017   Pneumococcal Polysaccharide-23 04/14/2019   Tdap 01/31/2016   Zoster Recombinat (Shingrix) 07/20/2020, 11/24/2020   Zoster, Live 12/09/2014    TDAP status: Up to date  Flu Vaccine status: Up to date  Pneumococcal vaccine status: Up to date  Covid-19 vaccine status: Completed vaccines  Qualifies for Shingles Vaccine? Yes   Zostavax completed Yes   Shingrix Completed?: Yes  Screening Tests Health Maintenance  Topic Date Due   DEXA SCAN  09/19/2020   COVID-19 Vaccine (4 - Booster for Pfizer series) 11/15/2020   MAMMOGRAM  05/26/2022   COLONOSCOPY (Pts 45-77yrs Insurance coverage will need to be confirmed)  07/07/2024   TETANUS/TDAP  01/30/2026   Pneumonia Vaccine 42+ Years old  Completed   INFLUENZA VACCINE  Completed   Hepatitis C Screening  Completed   Zoster Vaccines- Shingrix  Completed   HPV VACCINES  Aged Out    Health Maintenance  Health Maintenance Due  Topic Date Due   DEXA SCAN  09/19/2020   COVID-19 Vaccine (4 - Booster for [place] series) 11/15/2020    Colorectal cancer screening: Type of screening: Colonoscopy. Completed 07/08/2014. Repeat every 10 years  Mammogram status: Completed 05/25/2020. Repeat every year  Bone Density status: Completed 09/20/2018. Results reflect: Bone density results: OSTEOPOROSIS. Repeat every 2-3 years.  Lung Cancer  Screening: (Low Dose CT Chest recommended if Age 68-80 years, 30 pack-year currently smoking OR have quit w/in 15years.) does not qualify.   Lung Cancer Screening Referral: no  Additional Screening:  Hepatitis C Screening: does qualify; Completed yes  Vision Screening: Recommended annual ophthalmology exams for early detection of glaucoma and other disorders of the eye. Is the patient up to date with their annual eye exam?  Yes  Who is the provider or what is the name of the office in which the patient attends annual eye exams? Marica Otter, OD. If pt is not established with a provider, would they like to be referred to a provider to establish care? No .   Dental Screening: Recommended annual dental exams for proper oral hygiene  Community Resource Referral / Chronic Care Management: CRR required this visit?  No   CCM required this visit?  No      Plan:     I have personally reviewed and noted the following in the patients chart:   Medical and social history Use of alcohol, tobacco or illicit drugs  Current medications and supplements including opioid prescriptions.  Functional ability and status Nutritional status Physical activity Advanced directives List of other physicians Hospitalizations, surgeries, and ER visits in previous 12 months Vitals Screenings to include cognitive, depression, and falls Referrals and appointments  In addition, I have reviewed and discussed with patient certain preventive protocols, quality metrics, and best practice recommendations. A written personalized care plan for preventive services as well as general preventive health recommendations were provided to patient.     Sheral Flow, LPN   [id number]   Nurse Notes:  Patient is cogitatively intact. There were no vitals filed for this visit. There is no height or weight on file to calculate BMI.  Patient stated that she has no issues with gait or balance; does not use any assistive  devices. Medications reviewed with patient; no opioid use noted. Hearing Screening - Comments:: Patient denied any hearing difficulty. No hearing aids. Vision Screening - Comments:: Patient wears corrective glasses for reading only.  Eye exam done annually by: Marica Otter, OD.

## 2020-12-27 ENCOUNTER — Encounter: Payer: Self-pay | Admitting: Internal Medicine

## 2020-12-31 ENCOUNTER — Other Ambulatory Visit: Payer: Self-pay | Admitting: Internal Medicine

## 2021-01-16 ENCOUNTER — Encounter: Payer: Self-pay | Admitting: Internal Medicine

## 2021-01-16 NOTE — Progress Notes (Signed)
Left foot    Subjective:    Patient ID: Holly Cochran, female    DOB: 09-21-1950, 71 y.o.   MRN: [id number]  This visit occurred during the SARS-CoV-2 public health emergency.  Safety protocols were in place, including screening questions prior to the visit, additional usage of staff PPE, and extensive cleaning of exam room while observing appropriate contact time as indicated for disinfecting solutions.    HPI The patient is here for an acute visit.  Shoulder pain - > two years ago broke both elbows - since then left arm has not been just right.  She has seen ortho - had l elbow injection and took meloxicam for a while and it helped.  She is still having left upper arm pain - not elbow pain -- it hurts when she flexes her arm - it feels muscular.  She has chronic left shoulder and left upper back muscular pain that has been helped by rolfing, but the man she goes to is out of town for an extended period of time.  She has started acupunture and it has helped some.   The pain in her left shoulder upper back pain. Worse with movement.    Neck pain - chronic.     Has done heat, salon patch, meloxicam, mm relaxers   She has had some pain in the central chest - she thinks it is muscular but does have an appt for a diagnostic mammogram.  [facility] needs referral for diagnostic mammo.       Medications and allergies reviewed with patient and updated if appropriate.  Patient Active Problem List   Diagnosis Date Noted   TMJ arthralgia 04/14/2019   Essential hypertension 03/15/2019   Intertrochanteric fracture of left femur ([place]) 11/18/2017   Vitamin D deficiency 08/31/2016   Family history of diabetes mellitus in mother 01/30/2016   Anxiety and depression 03/02/2015   Chronically dry eyes 07/20/2014   Common peroneal neuropathy of left lower extremity 05/20/2014   GERD (gastroesophageal reflux disease) 02/08/2011   RHINITIS 11/24/2008   GANGLION OF TENDON SHEATH 11/10/2008    Hyperlipidemia 08/27/2007   Osteoporosis 08/27/2007   Hypothyroidism, postradioiodine therapy 06/01/2006    Current Outpatient Medications on File Prior to Visit  Medication Sig Dispense Refill   alendronate (FOSAMAX) 70 MG tablet TAKE 1 TABLET EVERY 7 DAYS. TAKE WITH A FULL GLASS OF WATER ON AN EMPTY STOMACH. 12 tablet 3   Cholecalciferol (VITAMIN D3) 25 MCG (1000 UT) CAPS Take 1,000 Units by mouth daily.     escitalopram (LEXAPRO) 20 MG tablet TAKE 1 TABLET ONCE DAILY. 90 tablet 1   levothyroxine (SYNTHROID) 125 MCG tablet TAKE ONE TABLET ONCE DAILY AND TAKE 1/2 TABLET ON TUESDAY, THURSDAY AND SATURDAY 70 tablet 0   losartan (COZAAR) 25 MG tablet TAKE 1 TABLET ONCE DAILY. 90 tablet 1   meloxicam (MOBIC) 15 MG tablet Take 1 tablet (15 mg total) by mouth daily. 30 tablet 1   methocarbamol (ROBAXIN) 500 MG tablet TAKE 1 TABLET EVERY 6 HOURS AS NEEDED FOR MUSCLE SPASM. 60 tablet 3   omeprazole (PRILOSEC) 40 MG capsule TAKE ONE CAPSULE ONCE DAILY 30 MINUTES BEFORE a meal 90 capsule 0   pravastatin (PRAVACHOL) 40 MG tablet TAKE (1) TABLET DAILY AT BEDTIME. 90 tablet 2   tiZANidine (ZANAFLEX) 4 MG tablet Take 4 mg by mouth every 8 (eight) hours as needed.     No current facility-administered medications on file prior to visit.    Past Medical  History:  Diagnosis Date   Common peroneal neuropathy of left lower extremity 05/20/2014   FASCIITIS, PLANTAR 10/14/2007   Qualifier: Diagnosis of  By: Linna Darner MD, Gwyndolyn Saxon     GERD (gastroesophageal reflux disease)    Hyperlipidemia    Osteopenia 2015   T score -1.1   Thyroid disease    hyperthyroid-RA I    Past Surgical History:  Procedure Laterality Date   COLONOSCOPY  2005    Dr Sharlett Iles, negative   INTRAMEDULLARY (IM) NAIL INTERTROCHANTERIC Left 11/18/2017   Procedure: INTRAMEDULLARY (IM) NAIL INTERTROCHANTRIC;  Surgeon: Dorna Leitz, MD;  Location: [facility];  Service: Orthopedics;  Laterality: Left;   PLANTAR FASCIA SURGERY  08/2010    TONSILLECTOMY      Social History   Socioeconomic History   Marital status: Single    Spouse name: Not on file   Number of children: Not on file   Years of education: Not on file   Highest education level: Not on file  Occupational History   Not on file  Tobacco Use   Smoking status: Never   Smokeless tobacco: Never  Substance and Sexual Activity   Alcohol use: Yes    Alcohol/week: 1.0 standard drink    Types: 1 Standard drinks or equivalent per week    Comment: occasionally   Drug use: No   Sexual activity: Never    Birth control/protection: Abstinence, Post-menopausal    Comment: Virgin  Other Topics Concern   Not on file  Social History Narrative   Not on file   Social Determinants of Health   Financial Resource Strain: Low Risk    Difficulty of Paying Living Expenses: Not hard at all  Food Insecurity: No Food Insecurity   Worried About Charity fundraiser in the Last Year: Never true   Arboriculturist in the Last Year: Never true  Transportation Needs: No Transportation Needs   Lack of Transportation (Medical): No   Lack of Transportation (Non-Medical): No  Physical Activity: Sufficiently Active   Days of Exercise per Week: 5 days   Minutes of Exercise per Session: 30 min  Stress: No Stress Concern Present   Feeling of Stress : Not at all  Social Connections: Socially Integrated   Frequency of Communication with Friends and Family: More than three times a week   Frequency of Social Gatherings with Friends and Family: Once a week   Attends Religious Services: More than 4 times per year   Active Member of [employer] or Organizations: Yes   Attends Music therapist: More than 4 times per year   Marital Status: Married    Family History  Problem Relation Age of Onset   Diabetes Mother    Kidney disease Mother    Hypertension Mother    Cancer Father        Pancreatic   Breast cancer Paternal Aunt        Age 58's   Cancer Maternal Uncle         melanoma   Diabetes Maternal Uncle    Colon cancer Neg Hx    Esophageal cancer Neg Hx    Stomach cancer Neg Hx    Rectal cancer Neg Hx     Review of Systems  Constitutional:  Negative for fever.  Cardiovascular:  Positive for chest pain (MSK - less likely breast).  Musculoskeletal:  Positive for back pain, myalgias and neck pain.  Neurological:  Negative for weakness and numbness.  Objective:   Vitals:   01/17/21 0913  BP: 134/76  Pulse: 78  Temp: 98.3 F (36.8 C)  SpO2: 97%   BP Readings from Last 3 Encounters:  01/17/21 134/76  05/20/20 120/82  04/14/19 (!) 148/84   Wt Readings from Last 3 Encounters:  01/17/21 169 lb 3.2 oz (76.7 kg)  08/02/20 176 lb (79.8 kg)  05/20/20 173 lb (78.5 kg)   Body mass index is 28.16 kg/m.   Physical Exam Constitutional:      General: She is not in acute distress.    Appearance: Normal appearance. She is not ill-appearing.  HENT:     Head: Normocephalic and atraumatic.  Musculoskeletal:     Comments: Tenderness in left upper arm with flexing  - no pain with palpation.  Left upper back muscle pain medial aspect of scapula region, mild tenderness on - top of left shoulder - good ROM of shoulder  Skin:    General: Skin is warm and dry.     Findings: No bruising, erythema, lesion or rash.  Neurological:     Mental Status: She is alert.     Sensory: No sensory deficit.     Motor: No weakness.           Assessment & Plan:    See Problem List for Assessment and Plan of chronic medical problems.

## 2021-01-17 ENCOUNTER — Ambulatory Visit: Payer: Medicare PPO | Admitting: Internal Medicine

## 2021-01-17 ENCOUNTER — Other Ambulatory Visit: Payer: Self-pay

## 2021-01-17 VITALS — BP 134/76 | HR 78 | Temp 98.3°F | Ht 65.0 in | Wt 169.2 lb

## 2021-01-17 DIAGNOSIS — G8929 Other chronic pain: Secondary | ICD-10-CM | POA: Insufficient documentation

## 2021-01-17 DIAGNOSIS — M79622 Pain in left upper arm: Secondary | ICD-10-CM | POA: Diagnosis not present

## 2021-01-17 DIAGNOSIS — N644 Mastodynia: Secondary | ICD-10-CM | POA: Diagnosis not present

## 2021-01-17 DIAGNOSIS — M549 Dorsalgia, unspecified: Secondary | ICD-10-CM

## 2021-01-17 MED ORDER — METHOCARBAMOL 500 MG PO TABS: ORAL_TABLET | ORAL | 3 refills | Status: DC

## 2021-01-17 NOTE — Assessment & Plan Note (Signed)
Chronic Muscular in nature Improved with rolfing ? Related to left upper arm pain Also has some shoulder pain Has tried several things - methocarbamol, meloxicam, topicals Will refer to Dr Ranell Patrick for further evaluation / treatment Methocarbamol 500 mg Q 6 hr prn

## 2021-01-17 NOTE — Assessment & Plan Note (Signed)
Chronic Started after she broke her left elbow Has pain in the upper arm when she flexes her arm, no elbow pain, left upper back pain Has seen ortho, had mri of elbow Has tried several things Rolfing did help Will refer to Dr Ranell Patrick for further evaluation / treatment Methocarbamol 500 mg Q 6 hr prn

## 2021-01-17 NOTE — Assessment & Plan Note (Signed)
Acute Mid sternum/left breast tenderness - no palpable lumps per pt but her breast has had trauma in the past and has scar tissue Diagnostic mammogram ordered for [facility] - she has an appt next week

## 2021-01-17 NOTE — Patient Instructions (Addendum)
° °  Medications changes include :   meloxicam 15 mg daily  Your prescription(s) have been submitted to your pharmacy. Please take as directed and contact our office if you believe you are having problem(s) with the medication(s).   A referral was ordered for physical medicine and rehab - Dr Ranell Patrick.         Someone from their office will call you to schedule an appointment.    Diagnostic Mammogram ordered.

## 2021-01-20 ENCOUNTER — Encounter: Payer: Self-pay | Admitting: Physical Medicine and Rehabilitation

## 2021-01-25 ENCOUNTER — Telehealth: Payer: Self-pay | Admitting: Internal Medicine

## 2021-01-25 NOTE — Telephone Encounter (Signed)
Faxed today

## 2021-01-25 NOTE — Telephone Encounter (Signed)
Rep w/ [facility] states the patient has an appointment tomorrow 01-25-2021 and is requesting a prior auth signed and faxed back prior to patient's appt  Form has been printed and placed in provider's box  Fax [phone]

## 2021-01-26 ENCOUNTER — Encounter: Payer: Self-pay | Admitting: Internal Medicine

## 2021-01-26 DIAGNOSIS — N644 Mastodynia: Secondary | ICD-10-CM | POA: Diagnosis not present

## 2021-01-26 DIAGNOSIS — R928 Other abnormal and inconclusive findings on diagnostic imaging of breast: Secondary | ICD-10-CM | POA: Diagnosis not present

## 2021-01-26 LAB — HM MAMMOGRAPHY

## 2021-01-27 ENCOUNTER — Encounter: Payer: Self-pay | Admitting: Internal Medicine

## 2021-01-27 DIAGNOSIS — M79602 Pain in left arm: Secondary | ICD-10-CM

## 2021-01-27 DIAGNOSIS — M549 Dorsalgia, unspecified: Secondary | ICD-10-CM

## 2021-01-31 ENCOUNTER — Ambulatory Visit (INDEPENDENT_AMBULATORY_CARE_PROVIDER_SITE_OTHER): Payer: Medicare PPO

## 2021-01-31 ENCOUNTER — Ambulatory Visit: Payer: Medicare PPO | Admitting: Sports Medicine

## 2021-01-31 ENCOUNTER — Other Ambulatory Visit: Payer: Self-pay

## 2021-01-31 VITALS — BP 122/70 | HR 99 | Ht 65.0 in | Wt 172.0 lb

## 2021-01-31 DIAGNOSIS — M67922 Unspecified disorder of synovium and tendon, left upper arm: Secondary | ICD-10-CM | POA: Diagnosis not present

## 2021-01-31 DIAGNOSIS — M25512 Pain in left shoulder: Secondary | ICD-10-CM | POA: Diagnosis not present

## 2021-01-31 DIAGNOSIS — M19012 Primary osteoarthritis, left shoulder: Secondary | ICD-10-CM | POA: Diagnosis not present

## 2021-01-31 MED ORDER — MELOXICAM 15 MG PO TABS: 15.0000 mg | ORAL_TABLET | Freq: Every day | ORAL | 0 refills | Status: DC

## 2021-01-31 NOTE — Progress Notes (Signed)
Holly Cochran D.[place] Phone: [phone]   Assessment and Plan:     1. Left shoulder pain, unspecified chronicity 2. Tendinopathy of left biceps tendon -Chronic exacerbation, initial sports medicine visit - Waxing and waning left biceps pain for 2 years since elbow fracture - Suspect that elbow fracture and her rehab process led to strains of left brachioradialis and left biceps tendon - Start meloxicam 15 mg daily x2 weeks and may use remainder as needed for pain control.  Instructed not to use additional NSAIDs while taking meloxicam.  May use Tylenol for breakthrough pain - Start HEP and physical therapy for biceps and brachioradialis -X-ray obtained in clinic.  My interpretation: No acute fracture or dislocation  Pertinent previous records reviewed include MRI elbow 07/04/2020   Follow Up: 3 to 4 weeks for reevaluation.  Could consider biceps tendon CSI based on patient's progression   Subjective:   I, Holly Cochran, am serving as a Education administrator for Doctor Glennon Mac  Chief Complaint: left bicep and upper chest pain   HPI:   01/31/21 Patient is a 71 year old female complaining of left bicep and upper back pain. Patient states Shoulder pain - > two years ago broke both elbows - since then left arm has not been just right.  She has seen ortho - had l elbow injection and took meloxicam for a while and it helped.  She is still having left upper arm pain - not elbow pain -- it hurts when she flexes her arm - it feels muscular.  She has chronic left shoulder and left upper back muscular pain that has been helped by rolfing massage  but the man she goes to is out of town for an extended period of time.  She has started acupunture and it has helped some. The pain in her left shoulder upper chest area . Worse with movement. Has done heat, salon patch, meloxicam, mm relaxers . Pt does note that she always has  tight upper back   Relevant Historical Information: History of bilateral elbow fracture, history of femur fracture, osteoporosis on Fosamax  Additional pertinent review of systems negative.   Current Outpatient Medications:    meloxicam (MOBIC) 15 MG tablet, Take 1 tablet (15 mg total) by mouth daily., Disp: 30 tablet, Rfl: 0   alendronate (FOSAMAX) 70 MG tablet, TAKE 1 TABLET EVERY 7 DAYS. TAKE WITH A FULL GLASS OF WATER ON AN EMPTY STOMACH., Disp: 12 tablet, Rfl: 3   Cholecalciferol (VITAMIN D3) 25 MCG (1000 UT) CAPS, Take 1,000 Units by mouth daily., Disp: , Rfl:    escitalopram (LEXAPRO) 20 MG tablet, TAKE 1 TABLET ONCE DAILY., Disp: 90 tablet, Rfl: 1   levothyroxine (SYNTHROID) 125 MCG tablet, TAKE ONE TABLET ONCE DAILY AND TAKE 1/2 TABLET ON TUESDAY, THURSDAY AND SATURDAY, Disp: 70 tablet, Rfl: 0   losartan (COZAAR) 25 MG tablet, TAKE 1 TABLET ONCE DAILY., Disp: 90 tablet, Rfl: 1   meloxicam (MOBIC) 15 MG tablet, Take 1 tablet (15 mg total) by mouth daily., Disp: 30 tablet, Rfl: 1   methocarbamol (ROBAXIN) 500 MG tablet, TAKE 1 TABLET EVERY 6 HOURS AS NEEDED FOR MUSCLE SPASM., Disp: 60 tablet, Rfl: 3   omeprazole (PRILOSEC) 40 MG capsule, TAKE ONE CAPSULE ONCE DAILY 30 MINUTES BEFORE a meal, Disp: 90 capsule, Rfl: 0   pravastatin (PRAVACHOL) 40 MG tablet, TAKE (1) TABLET DAILY AT BEDTIME., Disp: 90 tablet, Rfl: 2  Objective:     Vitals:   01/31/21 0953  BP: 122/70  Pulse: 99  SpO2: 99%  Weight: 172 lb (78 kg)  Height: 5\' 5"  (1.651 m)      Body mass index is 28.62 kg/m.    Physical Exam:    Gen: Appears well, nad, nontoxic and pleasant Neuro:sensation intact, strength is 5/5 with df/pf/inv/ev, muscle tone wnl Skin: no suspicious lesion or defmority Psych: A&O, appropriate mood and affect  Left shoulder: no deformity, swelling or muscle wasting No scapular winging FF 180, abd 180, int 0, ext 90   NTTP over the [place], clavicle, ac, coracoid, biceps groove, humerus,  deltoid, trapezius, cervical spine Positive speeds Pain with resisted elbow flexion, worse with hand in full pronation and between pronation/supination vin with full supination Neg neer, hawkings, empty can, subscap liftoff,  obriens, crossarm Neg ant drawer, sulcus sign, apprehension Negative Spurling's test bilat FROM of neck    Electronically signed by:  Holly Cochran D.[facility] 10:33 AM 01/31/21

## 2021-01-31 NOTE — Patient Instructions (Addendum)
Good to see you  Meloxicam 15 mg daily for 2 weeks use the remainder as needed No other NSAID Bicep, brachioradialis  HEP  PT referral  3-4 week follow up

## 2021-02-01 ENCOUNTER — Other Ambulatory Visit: Payer: Self-pay | Admitting: Internal Medicine

## 2021-02-01 DIAGNOSIS — E890 Postprocedural hypothyroidism: Secondary | ICD-10-CM

## 2021-02-04 ENCOUNTER — Encounter: Payer: Self-pay | Admitting: Internal Medicine

## 2021-02-06 MED ORDER — SEMAGLUTIDE-WEIGHT MANAGEMENT 0.5 MG/0.5ML ~~LOC~~ SOAJ: 0.5000 mg | SUBCUTANEOUS | 0 refills | Status: AC

## 2021-02-06 MED ORDER — SEMAGLUTIDE-WEIGHT MANAGEMENT 0.25 MG/0.5ML ~~LOC~~ SOAJ: 0.2500 mg | SUBCUTANEOUS | 0 refills | Status: AC

## 2021-02-06 MED ORDER — SEMAGLUTIDE-WEIGHT MANAGEMENT 1 MG/0.5ML ~~LOC~~ SOAJ: 1.0000 mg | SUBCUTANEOUS | 0 refills | Status: AC

## 2021-02-10 ENCOUNTER — Encounter: Payer: Self-pay | Admitting: Internal Medicine

## 2021-02-10 NOTE — Progress Notes (Signed)
Outside notes received. Information abstracted. Notes sent to scan.  

## 2021-02-19 NOTE — Therapy (Signed)
OUTPATIENT PHYSICAL THERAPY SHOULDER EVALUATION   Patient Name: Holly Cochran MRN: [id number] DOB:12-07-1950, 71 y.o., female Today's Date: 02/22/2021   PT End of Session - 02/22/21 1309     Visit Number 1    Number of Visits 9    Date for PT Re-Evaluation 04/19/21    Authorization Type [employer] MCR    Progress Note Due on Visit 10    PT Start Time 1215    PT Stop Time 1300    PT Time Calculation (min) 45 min    Activity Tolerance Patient tolerated treatment well    Behavior During Therapy [facility] for tasks assessed/performed             Past Medical History:  Diagnosis Date   Common peroneal neuropathy of left lower extremity 05/20/2014   FASCIITIS, PLANTAR 10/14/2007   Qualifier: Diagnosis of  By: Linna Darner MD, Gwyndolyn Saxon     GERD (gastroesophageal reflux disease)    Hyperlipidemia    Osteopenia 2015   T score -1.1   Thyroid disease    hyperthyroid-RA I   Past Surgical History:  Procedure Laterality Date   COLONOSCOPY  2005    Dr Sharlett Iles, negative   INTRAMEDULLARY (IM) NAIL INTERTROCHANTERIC Left 11/18/2017   Procedure: INTRAMEDULLARY (IM) NAIL INTERTROCHANTRIC;  Surgeon: Dorna Leitz, MD;  Location: [facility];  Service: Orthopedics;  Laterality: Left;   PLANTAR FASCIA SURGERY  08/2010   TONSILLECTOMY     Patient Active Problem List   Diagnosis Date Noted   Upper back pain on left side 01/17/2021   Left upper arm pain 01/17/2021   Breast pain, left 01/17/2021   TMJ arthralgia 04/14/2019   Essential hypertension 03/15/2019   Intertrochanteric fracture of left femur ([place]) 11/18/2017   Vitamin D deficiency 08/31/2016   Family history of diabetes mellitus in mother 01/30/2016   Anxiety and depression 03/02/2015   Chronically dry eyes 07/20/2014   Common peroneal neuropathy of left lower extremity 05/20/2014   GERD (gastroesophageal reflux disease) 02/08/2011   RHINITIS 11/24/2008   GANGLION OF TENDON SHEATH 11/10/2008   Hyperlipidemia 08/27/2007   Osteoporosis  08/27/2007   Hypothyroidism, postradioiodine therapy 06/01/2006    PCP: Binnie Rail, MD  REFERRING PROVIDER: Glennon Mac, DO  REFERRING DIAG: [id number] (ICD-10-CM) - Left shoulder pain, unspecified chronicity  THERAPY DIAG:  Chronic left shoulder pain  Muscle weakness (generalized)   ONSET DATE: 06/2019  SUBJECTIVE:                                                                                                                                                                                      SUBJECTIVE STATEMENT: Pt reports primary c/o  anterior Lt shoulder pain stemming from an incident in which she sustained BIL elbow fxs from a fall when walking her dog in 2021. She also states that the Lt shoulder pain can reach across her collarbone. Pt also states she still has elbow pain and continues to have decreased Lt grip strength with pain, leading to difficulty holding up a coffee cup, although this has improved since her fall. She also has a hx of osteoporosis. She denies any clicking/ popping/ catching/ or N/T. Aggravating factors include reaching forward, swinging a golf club, and carrying >15 pounds. Easing factors include diclofenac gel, pain medication, rest. Current pain is 0/10, worst pain is 4/10. Pt denies any unexplained weight change, unrelenting night pain, bowel or bladder changes, nausea/ vomiting.   PERTINENT HISTORY: Osteoporosis, hx of BIL elbow fxs,   PAIN:  Are you having pain? No NPRS scale: 0/10 Pain location: Lt shoulder PAIN TYPE: sharp Pain description: intermittent  Aggravating factors: reaching forward, swinging a golf club, carrying >15 pounds Relieving factors: diclofenac gel, pain medication, rest  PRECAUTIONS: None  WEIGHT BEARING RESTRICTIONS No  FALLS:  Has patient fallen in last 6 months? No Number of falls: 0  LIVING ENVIRONMENT: Lives with: lives with their family Lives in: House/apartment Stairs: No;  Has following equipment at  home: None  OCCUPATION: Retired, plays golf recreationally  PLOF: Independent  PATIENT GOALS Return to golfing, carrying >15 pounds, housework  OBJECTIVE:   DIAGNOSTIC FINDINGS:  01/31/2021: DG Shoulder Left: IMPRESSION: Mild acromioclavicular osteoarthritis.  No acute fracture.  PATIENT SURVEYS:  FOTO 63%, predicted 69% in 10 visits  COGNITION:  Overall cognitive status: Within functional limits for tasks assessed     SENSATION:  Light touch: Appears intact  POSTURE: Forward head, BIL shoulders; tight pecs BIL  UPPER EXTREMITY AROM/PROM:  A/PROM Right 02/22/2021 Left 02/22/2021  Shoulder flexion WNL WNL, painful at [facility] joint  Shoulder abduction WNL WNL  Shoulder internal rotation WNL WNL  Shoulder external rotation WNL WNL  Shoulder horizontal adduction WNL WNL, painful at [facility] joint  Elbow flexion WNL WNL  Elbow extension WNL WNL  (Blank rows = not tested)  UPPER EXTREMITY MMT:  MMT Right 02/22/2021 Left 02/22/2021  Shoulder flexion 5/5 5/5 mild pain  Shoulder extension 5/5 5/5  Shoulder abduction 5/5 5/5  Shoulder horizontal adduction 5/5 5/5 pain  Shoulder internal rotation 5/5 5/5  Shoulder external rotation 5/5 5/5  Middle trapezius 5/5 4/5p!  Lower trapezius 3+/5 3/5  Latissimus Dorsi 5/5 4/5  Elbow flexion 5/5 5/5  Elbow extension 5/5 5/5  Grip strength (lbs) 66 65  (Blank rows = not tested)  SHOULDER SPECIAL TESTS:  Neer's: (-) BIL  Hawkins-Kennedy: (-) BIL  Cross-body adduction with OP: (+) on Lt  Speed's test: (-) BIL  Biceps load test I and II: (-) BIL  JOINT MOBILITY TESTING:  GHJ mobilizations WNL in all planes BIL;   PALPATION:  TTP to Lt AC joint and just inferior to [facility] joint  Functional Tests:  Pt able to lift 15# from waist to overhead with some difficulty and 2/10 pain   TODAY'S TREATMENT:  Issued HEP    PATIENT EDUCATION: Education details: Pt educated about potential underlying pathophysiology behind her pain presentation,  POC, prognosis, and HEP Person educated: Patient Education method: Consulting civil engineer, Demonstration, and Handouts Education comprehension: verbalized understanding and returned demonstration   HOME EXERCISE PROGRAM: Access Code: [email or web address] URL: [email or web address] Date: 02/22/2021 Prepared by: Vanessa [place]  Exercises Shoulder External Rotation and Scapular Retraction with  Resistance - 1 x daily - 7 x weekly - 3 sets - 10 reps - 3-sec hold Corner Pec Major Stretch - 1 x daily - 7 x weekly - 2 sets - 1-min hold Kneeling Plank with Scapular Protraction Retraction AROM - 1 x daily - 7 x weekly - 3 sets - 10 reps - 3-sec hold Standing Shoulder Row with Anchored Resistance - 1 x daily - 7 x weekly - 3 sets - 10 reps - 3-sec hold   ASSESSMENT:  CLINICAL IMPRESSION: Patient is a 71 y.o. F who was seen today for physical therapy evaluation and treatment for chronic Lt shoulder pain. Upon assessment, the pt's primary impairments include painful Lt shoulder flexion and horizontal adduction, TTP to Lt AC joint, weak Lt parascapular strength, impaired lifting capability, and limited pectoral extensibility BIL. Ruling up [facility] joint dysfunction due to these findings, in combination with positive cross-body adduction with overpressure and recent diagnostic imaging findings. Ruling out RTC, biceps tendinopathy, and labral pathology. The patient's impairments are limiting patient from cleaning, community activity, laundry, and yard work. Personal factors including 1-2 comorbidities: osteoporosis and hx of BIL elbow fxs  are also affecting patient's functional outcome. Patient will benefit from skilled PT to address above impairments and improve overall function.  REHAB POTENTIAL: Good  CLINICAL DECISION MAKING: Stable/uncomplicated  EVALUATION COMPLEXITY: Low   GOALS: Goals reviewed with patient? Yes  SHORT TERM GOALS:  STG Name Target Date Goal status  1 Pt will report understanding  and adherence to her HEP in order to promote independence in the management of her primary impairments. Baseline: HEP provided at eval 03/22/2021 INITIAL   LONG TERM GOALS:   LTG Name Target Date Goal status  1 Pt will demonstrate ability to lift 25# from waist to overhead with BUE with 0/10 pain in order to put away Christmas decorations with less limitation. Baseline: Pts report 2/10 pain when lifting >15 pounds 04/19/2021 INITIAL  2 Pt will achieve a 69% FOTO score in order to demonstrate improved functional ability as it relates to her shoulder pain. Baseline: 63% 04/19/2021 INITIAL  3 Pt will achieve BIL global parascapular strength of 5/5 with 0/10 pain in order to promote WNL scapular mechanics when golfing. Baseline: See MMT schart 04/19/2021 INITIAL  4 Pt will demonstrate WNL Lt shoulder flexion and horizontal adduction AROM with 0/10 pain in order to get dressed with less limitation. Baseline: Pain with flexion and horizontal adduction 04/19/2021 INITIAL   PLAN: PT FREQUENCY: 1x/week  PT DURATION: 8 weeks  PLANNED INTERVENTIONS: Therapeutic exercises, Therapeutic activity, Neuro Muscular re-education, Patient/Family education, Joint mobilization, Dry Needling, Electrical stimulation, Spinal mobilization, Cryotherapy, Moist heat, Taping, Vasopneumatic device, and Manual therapy  PLAN FOR NEXT SESSION: Progress Lt shoulder/ parascapular strengthening, promote anterior stretching   Vanessa [place], PT, DPT 02/22/21 1:45 PM

## 2021-02-22 ENCOUNTER — Ambulatory Visit: Payer: Medicare PPO | Attending: Sports Medicine

## 2021-02-22 ENCOUNTER — Other Ambulatory Visit: Payer: Self-pay

## 2021-02-22 DIAGNOSIS — M25512 Pain in left shoulder: Secondary | ICD-10-CM | POA: Insufficient documentation

## 2021-02-22 DIAGNOSIS — M6281 Muscle weakness (generalized): Secondary | ICD-10-CM | POA: Insufficient documentation

## 2021-02-22 DIAGNOSIS — G8929 Other chronic pain: Secondary | ICD-10-CM | POA: Insufficient documentation

## 2021-02-25 NOTE — Progress Notes (Signed)
Benito Mccreedy D.[place] Phone: [phone]   Assessment and Plan:     1. Left shoulder pain, unspecified chronicity 2. Tendinopathy of left biceps tendon 3. Arthritis of left acromioclavicular joint -Chronic with exacerbation, subsequent visit - Continued pain in the anterior shoulder along biceps tendon as well as more acute pain over [facility] joint radiating along clavicle but overall is improved compared with prior office visit - Discontinue meloxicam daily.  May use remainder as needed for breakthrough pain - Start Tylenol 500 mg 1-2 times per day for day-to-day pain relief - Continue Voltaren gel over painful areas 1-2 times a day for pain relief - Continue physical therapy   Pertinent previous records reviewed include physical therapy note from 02/22/2021   Follow Up: 4 weeks for reevaluation.  If no improvement or worsening of symptoms, could discuss CSI at [facility] joint versus biceps tendon based on area of pain   Subjective:   I, Moenique Parris, am serving as a Education administrator for Doctor Glennon Mac  Chief Complaint: left bicep and upper chest pain   HPI:  01/31/21 Patient is a 71 year old female complaining of left bicep and upper back pain. Patient states Shoulder pain - > two years ago broke both elbows - since then left arm has not been just right.  She has seen ortho - had l elbow injection and took meloxicam for a while and it helped.  She is still having left upper arm pain - not elbow pain -- it hurts when she flexes her arm - it feels muscular.  She has chronic left shoulder and left upper back muscular pain that has been helped by rolfing massage  but the man she goes to is out of town for an extended period of time.  She has started acupunture and it has helped some. The pain in her left shoulder upper chest area . Worse with movement. Has done heat, salon patch, meloxicam, mm relaxers . Pt does note that  she always has tight upper back   02/28/2021 Patient states that she has only been to PT one time they weren't abe to get her in until last week. Brought a cream to discuss but it really helps meloxicam helped a lot still has a little pain when bringing arm across . Thinks it will take some time in PT to get it right      Relevant Historical Information: History of bilateral elbow fracture, history of femur fracture, osteoporosis on Fosamax  Additional pertinent review of systems negative.   Current Outpatient Medications:    alendronate (FOSAMAX) 70 MG tablet, TAKE 1 TABLET EVERY 7 DAYS. TAKE WITH A FULL GLASS OF WATER ON AN EMPTY STOMACH., Disp: 12 tablet, Rfl: 3   Cholecalciferol (VITAMIN D3) 25 MCG (1000 UT) CAPS, Take 1,000 Units by mouth daily., Disp: , Rfl:    escitalopram (LEXAPRO) 20 MG tablet, TAKE 1 TABLET ONCE DAILY., Disp: 90 tablet, Rfl: 1   levothyroxine (SYNTHROID) 125 MCG tablet, TAKE ONE TABLET ONCE DAILY AND TAKE 1/2 TABLET ON TUESDAY, THURSDAY AND SATURDAY, Disp: 70 tablet, Rfl: 0   losartan (COZAAR) 25 MG tablet, TAKE 1 TABLET ONCE DAILY., Disp: 90 tablet, Rfl: 1   meloxicam (MOBIC) 15 MG tablet, Take 1 tablet (15 mg total) by mouth daily. (Patient not taking: Reported on 02/22/2021), Disp: 30 tablet, Rfl: 1   meloxicam (MOBIC) 15 MG tablet, Take 1 tablet (15 mg total) by  mouth daily. (Patient not taking: Reported on 02/22/2021), Disp: 30 tablet, Rfl: 0   methocarbamol (ROBAXIN) 500 MG tablet, TAKE 1 TABLET EVERY 6 HOURS AS NEEDED FOR MUSCLE SPASM., Disp: 60 tablet, Rfl: 3   omeprazole (PRILOSEC) 40 MG capsule, TAKE ONE CAPSULE ONCE DAILY 30 MINUTES BEFORE a meal, Disp: 90 capsule, Rfl: 0   pravastatin (PRAVACHOL) 40 MG tablet, TAKE (1) TABLET DAILY AT BEDTIME., Disp: 90 tablet, Rfl: 2   Semaglutide-Weight Management 0.25 MG/0.5ML SOAJ, Inject 0.25 mg into the skin once a week for 28 days., Disp: 2 mL, Rfl: 0   [START ON 03/07/2021] Semaglutide-Weight Management 0.5 MG/0.5ML  SOAJ, Inject 0.5 mg into the skin once a week for 28 days., Disp: 2 mL, Rfl: 0   [START ON 04/05/2021] Semaglutide-Weight Management 1 MG/0.5ML SOAJ, Inject 1 mg into the skin once a week for 28 days., Disp: 2 mL, Rfl: 0   Objective:     Vitals:   02/28/21 0954  BP: 118/80  Pulse: 79  SpO2: 98%  Weight: 171 lb (77.6 kg)  Height: 5\' 5"  (1.651 m)      Body mass index is 28.46 kg/m.    Physical Exam:    Gen: Appears well, nad, nontoxic and pleasant Neuro:sensation intact, strength is 5/5 with df/pf/inv/ev, muscle tone wnl Skin: no suspicious lesion or defmority Psych: A&O, appropriate mood and affect  Left shoulder: no deformity, swelling or muscle wasting No scapular winging FF 180, abd 180, int 0, ext 90 TTP [place], AC, biceps groove NTTP over the  , clavicle,  , coracoid,  humerus, deltoid, trapezius, cervical spine Positive speeds, positive crossarm Neg neer, hawkings, empty can, subscap liftoff,  , obriens,   Neg ant drawer, sulcus sign, apprehension Negative Spurling's test bilat FROM of neck    Electronically signed by:  Benito Mccreedy D.[facility] 10:24 AM 02/28/21

## 2021-02-28 ENCOUNTER — Ambulatory Visit: Payer: Medicare PPO | Admitting: Sports Medicine

## 2021-02-28 ENCOUNTER — Other Ambulatory Visit: Payer: Self-pay

## 2021-02-28 VITALS — BP 118/80 | HR 79 | Ht 65.0 in | Wt 171.0 lb

## 2021-02-28 DIAGNOSIS — M67922 Unspecified disorder of synovium and tendon, left upper arm: Secondary | ICD-10-CM

## 2021-02-28 DIAGNOSIS — M25512 Pain in left shoulder: Secondary | ICD-10-CM

## 2021-02-28 DIAGNOSIS — M19012 Primary osteoarthritis, left shoulder: Secondary | ICD-10-CM | POA: Diagnosis not present

## 2021-02-28 NOTE — Patient Instructions (Addendum)
Good to see you  Discontinue meloxicam  Use remainder of meloxicam as needed for pain  Tylenol 500 mg 1-2 times a day for pain relief  Can continue Voltaren gel daily  Continue PT  1 month follow up

## 2021-03-02 ENCOUNTER — Ambulatory Visit: Payer: Medicare PPO

## 2021-03-02 ENCOUNTER — Other Ambulatory Visit: Payer: Self-pay

## 2021-03-02 DIAGNOSIS — M6281 Muscle weakness (generalized): Secondary | ICD-10-CM

## 2021-03-02 DIAGNOSIS — M25512 Pain in left shoulder: Secondary | ICD-10-CM | POA: Diagnosis not present

## 2021-03-02 DIAGNOSIS — G8929 Other chronic pain: Secondary | ICD-10-CM

## 2021-03-02 NOTE — Therapy (Signed)
OUTPATIENT PHYSICAL THERAPY TREATMENT NOTE   Patient Name: Holly Cochran MRN: [id number] DOB:[birth year], 71 y.o., female Today's Date: 03/02/2021  PCP: Binnie Rail, MD REFERRING PROVIDER: Glennon Mac, DO   PT End of Session - 03/02/21 1217     Visit Number 2    Number of Visits 9    Date for PT Re-Evaluation 04/19/21    Authorization Type [employer] MCR    Progress Note Due on Visit 10    PT Start Time 1217    PT Stop Time 0932    PT Time Calculation (min) 41 min    Activity Tolerance Patient tolerated treatment well    Behavior During Therapy [facility] for tasks assessed/performed             Past Medical History:  Diagnosis Date   Common peroneal neuropathy of left lower extremity 05/20/2014   FASCIITIS, PLANTAR 10/14/2007   Qualifier: Diagnosis of  By: Linna Darner MD, Gwyndolyn Saxon     GERD (gastroesophageal reflux disease)    Hyperlipidemia    Osteopenia 2015   T score -1.1   Thyroid disease    hyperthyroid-RA I   Past Surgical History:  Procedure Laterality Date   COLONOSCOPY  2005    Dr Sharlett Iles, negative   INTRAMEDULLARY (IM) NAIL INTERTROCHANTERIC Left 11/18/2017   Procedure: INTRAMEDULLARY (IM) NAIL INTERTROCHANTRIC;  Surgeon: Dorna Leitz, MD;  Location: [facility];  Service: Orthopedics;  Laterality: Left;   PLANTAR FASCIA SURGERY  08/2010   TONSILLECTOMY     Patient Active Problem List   Diagnosis Date Noted   Upper back pain on left side 01/17/2021   Left upper arm pain 01/17/2021   Breast pain, left 01/17/2021   TMJ arthralgia 04/14/2019   Essential hypertension 03/15/2019   Intertrochanteric fracture of left femur ([place]) 11/18/2017   Vitamin D deficiency 08/31/2016   Family history of diabetes mellitus in mother 01/30/2016   Anxiety and depression 03/02/2015   Chronically dry eyes 07/20/2014   Common peroneal neuropathy of left lower extremity 05/20/2014   GERD (gastroesophageal reflux disease) 02/08/2011   RHINITIS 11/24/2008   GANGLION OF TENDON  SHEATH 11/10/2008   Hyperlipidemia 08/27/2007   Osteoporosis 08/27/2007   Hypothyroidism, postradioiodine therapy 06/01/2006    REFERRING DIAG: M25.512 (ICD-10-CM) - Left shoulder pain, unspecified chronicity  THERAPY DIAG:  Chronic left shoulder pain  Muscle weakness (generalized)  PERTINENT HISTORY: Osteoporosis, hx of BIL elbow fxs,   PRECAUTIONS: None  SUBJECTIVE: I'm having some pain in the front of my L shoulder, I might have stretched it out a little too much.   PAIN:  Are you having pain? No NPRS scale: 3/10 Pain location: Lt shoulder PAIN TYPE: sharp Pain description: intermittent  Aggravating factors: reaching forward, swinging a golf club, carrying >15 pounds Relieving factors: diclofenac gel, pain medication, rest      OBJECTIVE:    DIAGNOSTIC FINDINGS:  01/31/2021: DG Shoulder Left: IMPRESSION: Mild acromioclavicular osteoarthritis.  No acute fracture.   PATIENT SURVEYS:  FOTO 63%, predicted 69% in 10 visits   COGNITION:          Overall cognitive status: Within functional limits for tasks assessed                               SENSATION:          Light touch: Appears intact   POSTURE: Forward head, BIL shoulders; tight pecs BIL   UPPER EXTREMITY AROM/PROM:  A/PROM Right 02/22/2021 Left 02/22/2021  Shoulder flexion WNL WNL, painful at [facility] joint  Shoulder abduction WNL WNL  Shoulder internal rotation WNL WNL  Shoulder external rotation WNL WNL  Shoulder horizontal adduction WNL WNL, painful at [facility] joint  Elbow flexion WNL WNL  Elbow extension WNL WNL  (Blank rows = not tested)   UPPER EXTREMITY MMT:   MMT Right 02/22/2021 Left 02/22/2021  Shoulder flexion 5/5 5/5 mild pain  Shoulder extension 5/5 5/5  Shoulder abduction 5/5 5/5  Shoulder horizontal adduction 5/5 5/5 pain  Shoulder internal rotation 5/5 5/5  Shoulder external rotation 5/5 5/5  Middle trapezius 5/5 4/5p!  Lower trapezius 3+/5 3/5  Latissimus Dorsi 5/5 4/5  Elbow flexion  5/5 5/5  Elbow extension 5/5 5/5  Grip strength (lbs) 66 65  (Blank rows = not tested)   SHOULDER SPECIAL TESTS:           Neer's: (-) BIL           Hawkins-Kennedy: (-) BIL           Cross-body adduction with OP: (+) on Lt           Speed's test: (-) BIL           Biceps load test I and II: (-) BIL   JOINT MOBILITY TESTING:  GHJ mobilizations WNL in all planes BIL;    PALPATION:  TTP to Lt AC joint and just inferior to [facility] joint   Functional Tests:  Pt able to lift 15# from waist to overhead with some difficulty and 2/10 pain            TODAY'S TREATMENT:  [facility] Adult PT Treatment:                                                DATE: 03/02/2021 Therapeutic Exercise: UBE level 3 x 5 mins/ 2.5 forward/retro  Corner pec stretch 2 x 30" Rows RTB 2 x 10 Shoulder extension RTB 2 x 10 ER/IR RTB BIL x 10 each Shoulder ab/adduction RTB x 10 BIL Prone ITWY's, no weight, x 10 each Thoracic extension over 1/2 foam roll 15" hold x 5 Kneeling plank with protraction/retraction 3" hold x 10 Moved to wall to teach mechanics of pro/retraction as she was having difficulty understanding motion Placing 2# from counter to cabinet 2 x 10 L Horizontal abduction RTB x 10 Diagonals RTB x 10   [facility] Adult PT Treatment:                                                DATE:02/22/2021 Issued HEP       PATIENT EDUCATION: Education details: Pt educated about potential underlying pathophysiology behind her pain presentation, POC, prognosis, and HEP Person educated: Patient Education method: Consulting civil engineer, Media planner, and Handouts Education comprehension: verbalized understanding and returned demonstration     HOME EXERCISE PROGRAM: Access Code: [email or web address] URL: [email or web address] Date: 02/22/2021 Prepared by: Vanessa    Exercises Shoulder External Rotation and Scapular Retraction with Resistance - 1 x daily - 7 x weekly - 3 sets - 10 reps - 3-sec hold Corner Pec Major  Stretch - 1 x daily - 7 x weekly - 2 sets - 1-min hold Kneeling  Plank with Scapular Protraction Retraction AROM - 1 x daily - 7 x weekly - 3 sets - 10 reps - 3-sec hold Standing Shoulder Row with Anchored Resistance - 1 x daily - 7 x weekly - 3 sets - 10 reps - 3-sec hold     ASSESSMENT:   CLINICAL IMPRESSION: Patient presents to PT with mild pain on anterior L shoulder. She exhibits good participation throughout session and was able to complete all prescribed exercises. She exhibited an increase in pain with lifting handweight into middle shelf of cabinet to 4/10. Next session plan to increase theraband resistance and incorporate lat pull downs. Patient continues to benefit from skilled PT services and should be progressed as able to improve functional independence.    REHAB POTENTIAL: Good   CLINICAL DECISION MAKING: Stable/uncomplicated   EVALUATION COMPLEXITY: Low     GOALS: Goals reviewed with patient? Yes   SHORT TERM GOALS:   STG Name Target Date Goal status  1 Pt will report understanding and adherence to her HEP in order to promote independence in the management of her primary impairments. Baseline: HEP provided at eval 03/22/2021 INITIAL    LONG TERM GOALS:    LTG Name Target Date Goal status  1 Pt will demonstrate ability to lift 25# from waist to overhead with BUE with 0/10 pain in order to put away Christmas decorations with less limitation. Baseline: Pts report 2/10 pain when lifting >15 pounds 04/19/2021 INITIAL  2 Pt will achieve a 69% FOTO score in order to demonstrate improved functional ability as it relates to her shoulder pain. Baseline: 63% 04/19/2021 INITIAL  3 Pt will achieve BIL global parascapular strength of 5/5 with 0/10 pain in order to promote WNL scapular mechanics when golfing. Baseline: See MMT schart 04/19/2021 INITIAL  4 Pt will demonstrate WNL Lt shoulder flexion and horizontal adduction AROM with 0/10 pain in order to get dressed with less  limitation. Baseline: Pain with flexion and horizontal adduction 04/19/2021 INITIAL    PLAN: PT FREQUENCY: 1x/week   PT DURATION: 8 weeks   PLANNED INTERVENTIONS: Therapeutic exercises, Therapeutic activity, Neuro Muscular re-education, Patient/Family education, Joint mobilization, Dry Needling, Electrical stimulation, Spinal mobilization, Cryotherapy, Moist heat, Taping, Vasopneumatic device, and Manual therapy   PLAN FOR NEXT SESSION: Progress Lt shoulder/ parascapular strengthening, promote anterior stretching, increase theraband resistance, lat pull downs    [place], PTA 03/02/2021, 12:44 PM

## 2021-03-08 ENCOUNTER — Other Ambulatory Visit: Payer: Self-pay

## 2021-03-08 ENCOUNTER — Ambulatory Visit: Payer: Medicare PPO

## 2021-03-08 DIAGNOSIS — M25512 Pain in left shoulder: Secondary | ICD-10-CM | POA: Diagnosis not present

## 2021-03-08 DIAGNOSIS — M6281 Muscle weakness (generalized): Secondary | ICD-10-CM

## 2021-03-08 DIAGNOSIS — G8929 Other chronic pain: Secondary | ICD-10-CM

## 2021-03-08 NOTE — Therapy (Signed)
OUTPATIENT PHYSICAL THERAPY TREATMENT NOTE   Patient Name: Holly Cochran MRN: [id number] DOB:[birth year], 70 y.o., female Today's Date: 03/08/2021  PCP: Binnie Rail, MD REFERRING PROVIDER: Glennon Mac, DO   PT End of Session - 03/08/21 1130     Visit Number 3    Number of Visits 9    Date for PT Re-Evaluation 04/19/21    Authorization Type [employer] MCR    Progress Note Due on Visit 10    PT Start Time 1130    PT Stop Time 1210    PT Time Calculation (min) 40 min    Activity Tolerance Patient tolerated treatment well    Behavior During Therapy [facility] for tasks assessed/performed              Past Medical History:  Diagnosis Date   Common peroneal neuropathy of left lower extremity 05/20/2014   FASCIITIS, PLANTAR 10/14/2007   Qualifier: Diagnosis of  By: Linna Darner MD, Gwyndolyn Saxon     GERD (gastroesophageal reflux disease)    Hyperlipidemia    Osteopenia 2015   T score -1.1   Thyroid disease    hyperthyroid-RA I   Past Surgical History:  Procedure Laterality Date   COLONOSCOPY  2005    Dr Sharlett Iles, negative   INTRAMEDULLARY (IM) NAIL INTERTROCHANTERIC Left 11/18/2017   Procedure: INTRAMEDULLARY (IM) NAIL INTERTROCHANTRIC;  Surgeon: Dorna Leitz, MD;  Location: [facility];  Service: Orthopedics;  Laterality: Left;   PLANTAR FASCIA SURGERY  08/2010   TONSILLECTOMY     Patient Active Problem List   Diagnosis Date Noted   Upper back pain on left side 01/17/2021   Left upper arm pain 01/17/2021   Breast pain, left 01/17/2021   TMJ arthralgia 04/14/2019   Essential hypertension 03/15/2019   Intertrochanteric fracture of left femur ([place]) 11/18/2017   Vitamin D deficiency 08/31/2016   Family history of diabetes mellitus in mother 01/30/2016   Anxiety and depression 03/02/2015   Chronically dry eyes 07/20/2014   Common peroneal neuropathy of left lower extremity 05/20/2014   GERD (gastroesophageal reflux disease) 02/08/2011   RHINITIS 11/24/2008   GANGLION OF TENDON  SHEATH 11/10/2008   Hyperlipidemia 08/27/2007   Osteoporosis 08/27/2007   Hypothyroidism, postradioiodine therapy 06/01/2006    REFERRING DIAG: M25.512 (ICD-10-CM) - Left shoulder pain, unspecified chronicity  THERAPY DIAG:  Chronic left shoulder pain  Muscle weakness (generalized)  PERTINENT HISTORY: Osteoporosis, hx of BIL elbow fxs,   PRECAUTIONS: None  SUBJECTIVE: I hit about 30 golf balls yesterday and haven't had any pain. I was doing easy swings.  PAIN:  Are you having pain? No (currently) NPRS scale: 4/10 (worst) Pain location: Lt shoulder PAIN TYPE: sharp Pain description: intermittent  Aggravating factors: reaching forward, swinging a golf club, carrying >15 pounds Relieving factors: diclofenac gel, pain medication, rest      OBJECTIVE:    DIAGNOSTIC FINDINGS:  01/31/2021: DG Shoulder Left: IMPRESSION: Mild acromioclavicular osteoarthritis.  No acute fracture.   PATIENT SURVEYS:  FOTO 63%, predicted 69% in 10 visits   COGNITION:          Overall cognitive status: Within functional limits for tasks assessed                               SENSATION:          Light touch: Appears intact   POSTURE: Forward head, BIL shoulders; tight pecs BIL   UPPER EXTREMITY AROM/PROM:   A/PROM Right  02/22/2021 Left 02/22/2021  Shoulder flexion WNL WNL, painful at [facility] joint  Shoulder abduction WNL WNL  Shoulder internal rotation WNL WNL  Shoulder external rotation WNL WNL  Shoulder horizontal adduction WNL WNL, painful at [facility] joint  Elbow flexion WNL WNL  Elbow extension WNL WNL  (Blank rows = not tested)   UPPER EXTREMITY MMT:   MMT Right 02/22/2021 Left 02/22/2021  Shoulder flexion 5/5 5/5 mild pain  Shoulder extension 5/5 5/5  Shoulder abduction 5/5 5/5  Shoulder horizontal adduction 5/5 5/5 pain  Shoulder internal rotation 5/5 5/5  Shoulder external rotation 5/5 5/5  Middle trapezius 5/5 4/5p!  Lower trapezius 3+/5 3/5  Latissimus Dorsi 5/5 4/5  Elbow  flexion 5/5 5/5  Elbow extension 5/5 5/5  Grip strength (lbs) 66 65  (Blank rows = not tested)   SHOULDER SPECIAL TESTS:           Neer's: (-) BIL           Hawkins-Kennedy: (-) BIL           Cross-body adduction with OP: (+) on Lt           Speed's test: (-) BIL           Biceps load test I and II: (-) BIL   JOINT MOBILITY TESTING:  GHJ mobilizations WNL in all planes BIL;    PALPATION:  TTP to Lt AC joint and just inferior to [facility] joint   Functional Tests:  Pt able to lift 15# from waist to overhead with some difficulty and 2/10 pain            TODAY'S TREATMENT:  [facility] Adult PT Treatment:                                                DATE: 03/08/2021 Therapeutic Exercise: UBE level 3 x 4 mins/ 2 forward/retro  Corner pec stretch 3 x 30" Seated ER GTB 2 x 10 Thoracic extension over 1/2 foam roll 15" hold x 5 Wall plank with protraction/retraction 2 x 10 Placing 3# from counter to cabinet 2 x 10 L Horizontal abduction GTB 2 x 10 Diagonals GTB x 10 BIL Prone alternating UE/LE lift  2 x 10 Prone ITWY's, no weight, x 10 each Lat pull downs 20# 2 x 10 Sidelying open books x 10 BIL   [facility] Adult PT Treatment:                                                DATE: 03/02/2021 Therapeutic Exercise: UBE level 3 x 5 mins/ 2.5 forward/retro  Corner pec stretch 2 x 30" Rows RTB 2 x 10 Shoulder extension RTB 2 x 10 ER/IR RTB BIL x 10 each Shoulder ab/adduction RTB x 10 BIL Prone ITWY's, no weight, x 10 each Thoracic extension over 1/2 foam roll 15" hold x 5 Kneeling plank with protraction/retraction 3" hold x 10 Moved to wall to teach mechanics of pro/retraction as she was having difficulty understanding motion Placing 2# from counter to cabinet 2 x 10 L Horizontal abduction RTB x 10 Diagonals RTB x 10   [facility] Adult PT Treatment:  DATE:02/22/2021 Issued HEP       PATIENT EDUCATION: Education details: Pt educated about potential  underlying pathophysiology behind her pain presentation, POC, prognosis, and HEP Person educated: Patient Education method: Consulting civil engineer, Demonstration, and Handouts Education comprehension: verbalized understanding and returned demonstration     HOME EXERCISE PROGRAM: Access Code: [email or web address] URL: [email or web address] Date: 02/22/2021 Prepared by: Vanessa [place]olaevsk   Exercises Shoulder External Rotation and Scapular Retraction with Resistance - 1 x daily - 7 x weekly - 3 sets - 10 reps - 3-sec hold Corner Pec Major Stretch - 1 x daily - 7 x weekly - 2 sets - 1-min hold Kneeling Plank with Scapular Protraction Retraction AROM - 1 x daily - 7 x weekly - 3 sets - 10 reps - 3-sec hold Standing Shoulder Row with Anchored Resistance - 1 x daily - 7 x weekly - 3 sets - 10 reps - 3-sec hold     ASSESSMENT:   CLINICAL IMPRESSION: Patient presents to PT with mild anterior L shoulder pain and no increase in pain after playing golf for the first time since last fall. She exhibits an increase in pain with cross body movements of her left upper extremity and when lying on her left side. She was able to complete all prescribed exercises with increased resistance form last session with no adverse effects. Patient continues to benefit from skilled PT services and should be progressed as able to improve functional independence.    REHAB POTENTIAL: Good   CLINICAL DECISION MAKING: Stable/uncomplicated   EVALUATION COMPLEXITY: Low     GOALS: Goals reviewed with patient? Yes   SHORT TERM GOALS:   STG Name Target Date Goal status  1 Pt will report understanding and adherence to her HEP in order to promote independence in the management of her primary impairments. Baseline: HEP provided at eval 03/22/2021 INITIAL    LONG TERM GOALS:    LTG Name Target Date Goal status  1 Pt will demonstrate ability to lift 25# from waist to overhead with BUE with 0/10 pain in order to put away  Christmas decorations with less limitation. Baseline: Pts report 2/10 pain when lifting >15 pounds 04/19/2021 INITIAL  2 Pt will achieve a 69% FOTO score in order to demonstrate improved functional ability as it relates to her shoulder pain. Baseline: 63% 04/19/2021 INITIAL  3 Pt will achieve BIL global parascapular strength of 5/5 with 0/10 pain in order to promote WNL scapular mechanics when golfing. Baseline: See MMT schart 04/19/2021 INITIAL  4 Pt will demonstrate WNL Lt shoulder flexion and horizontal adduction AROM with 0/10 pain in order to get dressed with less limitation. Baseline: Pain with flexion and horizontal adduction 04/19/2021 INITIAL    PLAN: PT FREQUENCY: 1x/week   PT DURATION: 8 weeks   PLANNED INTERVENTIONS: Therapeutic exercises, Therapeutic activity, Neuro Muscular re-education, Patient/Family education, Joint mobilization, Dry Needling, Electrical stimulation, Spinal mobilization, Cryotherapy, Moist heat, Taping, Vasopneumatic device, and Manual therapy   PLAN FOR NEXT SESSION: Progress Lt shoulder/ parascapular strengthening, promote anterior stretching, increase theraband resistance, lat pull downs    [place], PTA 03/08/2021, 12:11 PM

## 2021-03-10 ENCOUNTER — Other Ambulatory Visit: Payer: Self-pay | Admitting: Internal Medicine

## 2021-03-10 DIAGNOSIS — H524 Presbyopia: Secondary | ICD-10-CM | POA: Diagnosis not present

## 2021-03-10 DIAGNOSIS — H25812 Combined forms of age-related cataract, left eye: Secondary | ICD-10-CM | POA: Diagnosis not present

## 2021-03-10 DIAGNOSIS — E7800 Pure hypercholesterolemia, unspecified: Secondary | ICD-10-CM

## 2021-03-10 DIAGNOSIS — H52223 Regular astigmatism, bilateral: Secondary | ICD-10-CM | POA: Diagnosis not present

## 2021-03-10 DIAGNOSIS — H5203 Hypermetropia, bilateral: Secondary | ICD-10-CM | POA: Diagnosis not present

## 2021-03-15 ENCOUNTER — Other Ambulatory Visit: Payer: Self-pay

## 2021-03-15 ENCOUNTER — Ambulatory Visit: Payer: Medicare PPO | Attending: Sports Medicine

## 2021-03-15 DIAGNOSIS — G8929 Other chronic pain: Secondary | ICD-10-CM | POA: Diagnosis not present

## 2021-03-15 DIAGNOSIS — M6281 Muscle weakness (generalized): Secondary | ICD-10-CM | POA: Diagnosis not present

## 2021-03-15 DIAGNOSIS — M25512 Pain in left shoulder: Secondary | ICD-10-CM | POA: Insufficient documentation

## 2021-03-15 NOTE — Therapy (Signed)
OUTPATIENT PHYSICAL THERAPY TREATMENT NOTE   Patient Name: Holly Cochran MRN: [id number] DOB:Apr 10, 1950, 71 y.o., female Today's Date: 03/15/2021  PCP: Binnie Rail, MD REFERRING PROVIDER: Glennon Mac, DO   PT End of Session - 03/15/21 1130     Visit Number 4    Number of Visits 9    Date for PT Re-Evaluation 04/19/21    Authorization Type [employer] MCR    Progress Note Due on Visit 10    PT Start Time 1130    PT Stop Time 1215    PT Time Calculation (min) 45 min    Activity Tolerance Patient tolerated treatment well    Behavior During Therapy [facility] for tasks assessed/performed               Past Medical History:  Diagnosis Date   Common peroneal neuropathy of left lower extremity 05/20/2014   FASCIITIS, PLANTAR 10/14/2007   Qualifier: Diagnosis of  By: Linna Darner MD, Gwyndolyn Saxon     GERD (gastroesophageal reflux disease)    Hyperlipidemia    Osteopenia 2015   T score -1.1   Thyroid disease    hyperthyroid-RA I   Past Surgical History:  Procedure Laterality Date   COLONOSCOPY  2005    Dr Sharlett Iles, negative   INTRAMEDULLARY (IM) NAIL INTERTROCHANTERIC Left 11/18/2017   Procedure: INTRAMEDULLARY (IM) NAIL INTERTROCHANTRIC;  Surgeon: Dorna Leitz, MD;  Location: [facility];  Service: Orthopedics;  Laterality: Left;   PLANTAR FASCIA SURGERY  08/2010   TONSILLECTOMY     Patient Active Problem List   Diagnosis Date Noted   Upper back pain on left side 01/17/2021   Left upper arm pain 01/17/2021   Breast pain, left 01/17/2021   TMJ arthralgia 04/14/2019   Essential hypertension 03/15/2019   Intertrochanteric fracture of left femur ([place]) 11/18/2017   Vitamin D deficiency 08/31/2016   Family history of diabetes mellitus in mother 01/30/2016   Anxiety and depression 03/02/2015   Chronically dry eyes 07/20/2014   Common peroneal neuropathy of left lower extremity 05/20/2014   GERD (gastroesophageal reflux disease) 02/08/2011   RHINITIS 11/24/2008   GANGLION OF TENDON  SHEATH 11/10/2008   Hyperlipidemia 08/27/2007   Osteoporosis 08/27/2007   Hypothyroidism, postradioiodine therapy 06/01/2006    REFERRING DIAG: M25.512 (ICD-10-CM) - Left shoulder pain, unspecified chronicity  THERAPY DIAG:  Chronic left shoulder pain  Muscle weakness (generalized)  PERTINENT HISTORY: Osteoporosis, hx of BIL elbow fxs,   PRECAUTIONS: None  SUBJECTIVE: I still have that same nagging pain only when I cross my arm over my chest.  PAIN:  Are you having pain? No (currently) NPRS scale: 0/10 (currently)  Pain location: Lt shoulder PAIN TYPE: sharp Pain description: intermittent  Aggravating factors: reaching forward, swinging a golf club, carrying >15 pounds Relieving factors: diclofenac gel, pain medication, rest     OBJECTIVE:    DIAGNOSTIC FINDINGS:  01/31/2021: DG Shoulder Left: IMPRESSION: Mild acromioclavicular osteoarthritis.  No acute fracture.   PATIENT SURVEYS:  FOTO 63%, predicted 69% in 10 visits   COGNITION:          Overall cognitive status: Within functional limits for tasks assessed                               SENSATION:          Light touch: Appears intact   POSTURE: Forward head, BIL shoulders; tight pecs BIL   UPPER EXTREMITY AROM/PROM:   A/PROM Right  02/22/2021 Left 02/22/2021  Shoulder flexion WNL WNL, painful at [facility] joint  Shoulder abduction WNL WNL  Shoulder internal rotation WNL WNL  Shoulder external rotation WNL WNL  Shoulder horizontal adduction WNL WNL, painful at [place] joint  Elbow flexion WNL WNL  Elbow extension WNL WNL  (Blank rows = not tested)   UPPER EXTREMITY MMT:   MMT Right 02/22/2021 Left 02/22/2021 Right 03/15/2021 Left 03/15/2021  Shoulder flexion 5/5 5/5 mild pain    Shoulder extension 5/5 5/5    Shoulder abduction 5/5 5/5    Shoulder horizontal adduction 5/5 5/5 pain    Shoulder internal rotation 5/5 5/5    Shoulder external rotation 5/5 5/5    Middle trapezius 5/5 4/5p!  4/5  Lower trapezius 3+/5  3/5 3+/5 3+/5  Latissimus Dorsi 5/5 4/5    Elbow flexion 5/5 5/5    Elbow extension 5/5 5/5    Grip strength (lbs) 66 65    (Blank rows = not tested)   SHOULDER SPECIAL TESTS:           Neer's: (-) BIL           Hawkins-Kennedy: (-) BIL           Cross-body adduction with OP: (+) on Lt           Speed's test: (-) BIL           Biceps load test I and II: (-) BIL   JOINT MOBILITY TESTING:  GHJ mobilizations WNL in all planes BIL;    PALPATION:  TTP to Lt AC joint and just inferior to [employer] joint   Functional Tests:  Pt able to lift 15# from waist to overhead with some difficulty and 2/10 pain            TODAY'S TREATMENT:  [facility] Adult PT Treatment:                                                DATE: 03/15/2021 Therapeutic Exercise: UBE level 3 x 5 mins/ 2.5 mins forward/retro  Corner pec stretch 3 x 30" Seated ER GTB 2 x 10 Placing 3# from counter to cabinet 2 x 10 L  Horizontal abduction GTB 2 x 10 Diagonals GTB x 10 BIL Prone alternating UE/LE lift  2 x 10 Prone ITWY's, no weight, x 10 each Lat pull downs 20# 2 x 10 Rows 20# 2 x 10 Sidelying open books x 10 BIL    [facility] Adult PT Treatment:                                                DATE: 03/08/2021 Therapeutic Exercise: UBE level 3 x 4 mins/ 2 forward/retro  Corner pec stretch 3 x 30" Seated ER GTB 2 x 10 Thoracic extension over 1/2 foam roll 15" hold x 5 Wall plank with protraction/retraction 2 x 10 Placing 3# from counter to cabinet 2 x 10 L Horizontal abduction GTB 2 x 10 Diagonals GTB x 10 BIL Prone alternating UE/LE lift  2 x 10 Prone ITWY's, no weight, x 10 each Lat pull downs 20# 2 x 10 Sidelying open books x 10 BIL   [facility] Adult PT Treatment:  DATE: 03/02/2021 Therapeutic Exercise: UBE level 3 x 5 mins/ 2.5 forward/retro  Corner pec stretch 2 x 30" Rows RTB 2 x 10 Shoulder extension RTB 2 x 10 ER/IR RTB BIL x 10 each Shoulder ab/adduction RTB x 10  BIL Prone ITWY's, no weight, x 10 each Thoracic extension over 1/2 foam roll 15" hold x 5 Kneeling plank with protraction/retraction 3" hold x 10 Moved to wall to teach mechanics of pro/retraction as she was having difficulty understanding motion Placing 2# from counter to cabinet 2 x 10 L Horizontal abduction RTB x 10 Diagonals RTB x 10        PATIENT EDUCATION: Education details: Pt educated about potential underlying pathophysiology behind her pain presentation, POC, prognosis, and HEP Person educated: Patient Education method: Consulting civil engineer, Demonstration, and Handouts Education comprehension: verbalized understanding and returned demonstration     HOME EXERCISE PROGRAM: Access Code: [email or web address] URL: [email or web address] Date: 02/22/2021 Prepared by: Vanessa Plymouth   Exercises Shoulder External Rotation and Scapular Retraction with Resistance - 1 x daily - 7 x weekly - 3 sets - 10 reps - 3-sec hold Corner Pec Major Stretch - 1 x daily - 7 x weekly - 2 sets - 1-min hold Kneeling Plank with Scapular Protraction Retraction AROM - 1 x daily - 7 x weekly - 3 sets - 10 reps - 3-sec hold Standing Shoulder Row with Anchored Resistance - 1 x daily - 7 x weekly - 3 sets - 10 reps - 3-sec hold     ASSESSMENT:   CLINICAL IMPRESSION: Patient presents to PT with no current pain but reports mild pain on her anterior L shoulder with cross body motions. She was able to complete all prescribed exercises with no adverse effects. Continue to focus on improving left shoulder and periscapular strength. Patient continues to benefit from skilled PT services and should be progressed as able to improve functional independence.   REHAB POTENTIAL: Good   CLINICAL DECISION MAKING: Stable/uncomplicated   EVALUATION COMPLEXITY: Low     GOALS: Goals reviewed with patient? Yes   SHORT TERM GOALS:   STG Name Target Date Goal status  1 Pt will report understanding and adherence to her  HEP in order to promote independence in the management of her primary impairments. Baseline: HEP provided at eval 03/22/2021 MET    LONG TERM GOALS:    LTG Name Target Date Goal status  1 Pt will demonstrate ability to lift 25# from waist to overhead with BUE with 0/10 pain in order to put away Christmas decorations with less limitation. Baseline: Pts report 2/10 pain when lifting >15 pounds 04/19/2021 INITIAL  2 Pt will achieve a 69% FOTO score in order to demonstrate improved functional ability as it relates to her shoulder pain. Baseline: 63% 04/19/2021 INITIAL  3 Pt will achieve BIL global parascapular strength of 5/5 with 0/10 pain in order to promote WNL scapular mechanics when golfing. Baseline: See MMT schart 04/19/2021 INITIAL  4 Pt will demonstrate WNL Lt shoulder flexion and horizontal adduction AROM with 0/10 pain in order to get dressed with less limitation. Baseline: Pain with flexion and horizontal adduction 04/19/2021 INITIAL    PLAN: PT FREQUENCY: 1x/week   PT DURATION: 8 weeks   PLANNED INTERVENTIONS: Therapeutic exercises, Therapeutic activity, Neuro Muscular re-education, Patient/Family education, Joint mobilization, Dry Needling, Electrical stimulation, Spinal mobilization, Cryotherapy, Moist heat, Taping, Vasopneumatic device, and Manual therapy   PLAN FOR NEXT SESSION: Progress Lt shoulder/ parascapular strengthening, promote anterior stretching    Evelene Croon, PTA  03/15/2021, 11:39 AM

## 2021-03-19 NOTE — Therapy (Incomplete)
OUTPATIENT PHYSICAL THERAPY TREATMENT NOTE   Patient Name: Holly Cochran MRN: [id number] DOB:04-Nov-1950, 71 y.o., female Today's Date: 03/19/2021  PCP: Binnie Rail, MD REFERRING PROVIDER: Glennon Mac, DO       Past Medical History:  Diagnosis Date   Common peroneal neuropathy of left lower extremity 05/20/2014   FASCIITIS, PLANTAR 10/14/2007   Qualifier: Diagnosis of  By: Linna Darner MD, Gwyndolyn Saxon     GERD (gastroesophageal reflux disease)    Hyperlipidemia    Osteopenia 2015   T score -1.1   Thyroid disease    hyperthyroid-RA I   Past Surgical History:  Procedure Laterality Date   COLONOSCOPY  2005    Dr Sharlett Iles, negative   INTRAMEDULLARY (IM) NAIL INTERTROCHANTERIC Left 11/18/2017   Procedure: INTRAMEDULLARY (IM) NAIL INTERTROCHANTRIC;  Surgeon: Dorna Leitz, MD;  Location: [facility];  Service: Orthopedics;  Laterality: Left;   PLANTAR FASCIA SURGERY  08/2010   TONSILLECTOMY     Patient Active Problem List   Diagnosis Date Noted   Upper back pain on left side 01/17/2021   Left upper arm pain 01/17/2021   Breast pain, left 01/17/2021   TMJ arthralgia 04/14/2019   Essential hypertension 03/15/2019   Intertrochanteric fracture of left femur ([place]) 11/18/2017   Vitamin D deficiency 08/31/2016   Family history of diabetes mellitus in mother 01/30/2016   Anxiety and depression 03/02/2015   Chronically dry eyes 07/20/2014   Common peroneal neuropathy of left lower extremity 05/20/2014   GERD (gastroesophageal reflux disease) 02/08/2011   RHINITIS 11/24/2008   GANGLION OF TENDON SHEATH 11/10/2008   Hyperlipidemia 08/27/2007   Osteoporosis 08/27/2007   Hypothyroidism, postradioiodine therapy 06/01/2006    REFERRING DIAG: M25.512 (ICD-10-CM) - Left shoulder pain, unspecified chronicity  THERAPY DIAG:  No diagnosis found.  PERTINENT HISTORY: Osteoporosis, hx of BIL elbow fxs,   PRECAUTIONS: None  SUBJECTIVE: *** I still have that same nagging pain only when  I cross my arm over my chest.  PAIN:  Are you having pain? No (currently) NPRS scale: ***/10 (currently)  Pain location: Lt shoulder PAIN TYPE: sharp Pain description: intermittent  Aggravating factors: reaching forward, swinging a golf club, carrying >15 pounds Relieving factors: diclofenac gel, pain medication, rest     OBJECTIVE:    DIAGNOSTIC FINDINGS:  01/31/2021: DG Shoulder Left: IMPRESSION: Mild acromioclavicular osteoarthritis.  No acute fracture.   PATIENT SURVEYS:  FOTO 63%, predicted 69% in 10 visits   COGNITION:          Overall cognitive status: Within functional limits for tasks assessed                               SENSATION:          Light touch: Appears intact   POSTURE: Forward head, BIL shoulders; tight pecs BIL   UPPER EXTREMITY AROM/PROM:   A/PROM Right 02/22/2021 Left 02/22/2021  Shoulder flexion WNL WNL, painful at [facility] joint  Shoulder abduction WNL WNL  Shoulder internal rotation WNL WNL  Shoulder external rotation WNL WNL  Shoulder horizontal adduction WNL WNL, painful at [facility] joint  Elbow flexion WNL WNL  Elbow extension WNL WNL  (Blank rows = not tested)   UPPER EXTREMITY MMT:   MMT Right 02/22/2021 Left 02/22/2021 Right 03/15/2021 Left 03/15/2021  Shoulder flexion 5/5 5/5 mild pain    Shoulder extension 5/5 5/5    Shoulder abduction 5/5 5/5    Shoulder horizontal adduction 5/5 5/5 pain  Shoulder internal rotation 5/5 5/5    Shoulder external rotation 5/5 5/5    Middle trapezius 5/5 4/5p!  4/5  Lower trapezius 3+/5 3/5 3+/5 3+/5  Latissimus Dorsi 5/5 4/5    Elbow flexion 5/5 5/5    Elbow extension 5/5 5/5    Grip strength (lbs) 66 65    (Blank rows = not tested)   SHOULDER SPECIAL TESTS:           Neer's: (-) BIL           Hawkins-Kennedy: (-) BIL           Cross-body adduction with OP: (+) on Lt           Speed's test: (-) BIL           Biceps load test I and II: (-) BIL   JOINT MOBILITY TESTING:  GHJ mobilizations WNL in  all planes BIL;    PALPATION:  TTP to Lt AC joint and just inferior to [facility] joint   Functional Tests:  Pt able to lift 15# from waist to overhead with some difficulty and 2/10 pain            TODAY'S TREATMENT:  [facility] Adult PT Treatment:                                                DATE: 03/22/2021 Therapeutic Exercise: UBE level 3 x 5 mins/ 2.5 mins forward/retro  Corner pec stretch 3 x 30" Seated ER GTB 2 x 10 Placing 3# from counter to cabinet 2 x 10 L  Placing 6# from counter to cabinet using B UE 2 x 10 Horizontal abduction GTB 2 x 10 Diagonals GTB x 10 BIL Prone alternating UE/LE lift  2 x 10 Prone ITWY's, no weight, x 10 each Prone supermans x10 Lat pull downs 20# 2 x 10 Rows 20# 2 x 10 Sidelying open books x 10 BIL   [facility] Adult PT Treatment:                                                DATE: 03/15/2021 Therapeutic Exercise: UBE level 3 x 5 mins/ 2.5 mins forward/retro  Corner pec stretch 3 x 30" Seated ER GTB 2 x 10 Placing 3# from counter to cabinet 2 x 10 L  Horizontal abduction GTB 2 x 10 Diagonals GTB x 10 BIL Prone alternating UE/LE lift  2 x 10 Prone ITWY's, no weight, x 10 each Lat pull downs 20# 2 x 10 Rows 20# 2 x 10 Sidelying open books x 10 BIL    [facility] Adult PT Treatment:                                                DATE: 03/08/2021 Therapeutic Exercise: UBE level 3 x 4 mins/ 2 forward/retro  Corner pec stretch 3 x 30" Seated ER GTB 2 x 10 Thoracic extension over 1/2 foam roll 15" hold x 5 Wall plank with protraction/retraction 2 x 10 Placing 3# from counter to cabinet 2 x 10 L Horizontal abduction GTB 2 x  10 Diagonals GTB x 10 BIL Prone alternating UE/LE lift  2 x 10 Prone ITWY's, no weight, x 10 each Lat pull downs 20# 2 x 10 Sidelying open books x 10 BIL   [facility] Adult PT Treatment:                                                DATE: 03/02/2021 Therapeutic Exercise: UBE level 3 x 5 mins/ 2.5 forward/retro  Corner pec stretch 2 x  30" Rows RTB 2 x 10 Shoulder extension RTB 2 x 10 ER/IR RTB BIL x 10 each Shoulder ab/adduction RTB x 10 BIL Prone ITWY's, no weight, x 10 each Thoracic extension over 1/2 foam roll 15" hold x 5 Kneeling plank with protraction/retraction 3" hold x 10 Moved to wall to teach mechanics of pro/retraction as she was having difficulty understanding motion Placing 2# from counter to cabinet 2 x 10 L Horizontal abduction RTB x 10 Diagonals RTB x 10        PATIENT EDUCATION: Education details: Pt educated about potential underlying pathophysiology behind her pain presentation, POC, prognosis, and HEP Person educated: Patient Education method: Consulting civil engineer, Demonstration, and Handouts Education comprehension: verbalized understanding and returned demonstration     HOME EXERCISE PROGRAM: Access Code: [email or web address] URL: [email or web address] Date: 02/22/2021 Prepared by: Vanessa Grandin   Exercises Shoulder External Rotation and Scapular Retraction with Resistance - 1 x daily - 7 x weekly - 3 sets - 10 reps - 3-sec hold Corner Pec Major Stretch - 1 x daily - 7 x weekly - 2 sets - 1-min hold Kneeling Plank with Scapular Protraction Retraction AROM - 1 x daily - 7 x weekly - 3 sets - 10 reps - 3-sec hold Standing Shoulder Row with Anchored Resistance - 1 x daily - 7 x weekly - 3 sets - 10 reps - 3-sec hold     ASSESSMENT:   CLINICAL IMPRESSION: ***  Patient presents to PT with no current pain but reports mild pain on her anterior L shoulder with cross body motions. She was able to complete all prescribed exercises with no adverse effects. Continue to focus on improving left shoulder and periscapular strength. Patient continues to benefit from skilled PT services and should be progressed as able to improve functional independence.   REHAB POTENTIAL: Good   CLINICAL DECISION MAKING: Stable/uncomplicated   EVALUATION COMPLEXITY: Low     GOALS: Goals reviewed with patient?  Yes   SHORT TERM GOALS:   STG Name Target Date Goal status  1 Pt will report understanding and adherence to her HEP in order to promote independence in the management of her primary impairments. Baseline: HEP provided at eval 03/22/2021 MET    LONG TERM GOALS:    LTG Name Target Date Goal status  1 Pt will demonstrate ability to lift 25# from waist to overhead with BUE with 0/10 pain in order to put away Christmas decorations with less limitation. Baseline: Pts report 2/10 pain when lifting >15 pounds 04/19/2021 INITIAL  2 Pt will achieve a 69% FOTO score in order to demonstrate improved functional ability as it relates to her shoulder pain. Baseline: 63% 04/19/2021 INITIAL  3 Pt will achieve BIL global parascapular strength of 5/5 with 0/10 pain in order to promote WNL scapular mechanics when golfing. Baseline: See MMT schart 04/19/2021 INITIAL  4 Pt will demonstrate WNL Lt  shoulder flexion and horizontal adduction AROM with 0/10 pain in order to get dressed with less limitation. Baseline: Pain with flexion and horizontal adduction 04/19/2021 INITIAL    PLAN: PT FREQUENCY: 1x/week   PT DURATION: 8 weeks   PLANNED INTERVENTIONS: Therapeutic exercises, Therapeutic activity, Neuro Muscular re-education, Patient/Family education, Joint mobilization, Dry Needling, Electrical stimulation, Spinal mobilization, Cryotherapy, Moist heat, Taping, Vasopneumatic device, and Manual therapy   PLAN FOR NEXT SESSION: Progress Lt shoulder/ parascapular strengthening, promote anterior stretching    Evelene Croon, PTA 03/19/2021, 12:09 PM

## 2021-03-22 ENCOUNTER — Other Ambulatory Visit: Payer: Self-pay

## 2021-03-22 ENCOUNTER — Ambulatory Visit: Payer: Medicare PPO

## 2021-03-22 DIAGNOSIS — M6281 Muscle weakness (generalized): Secondary | ICD-10-CM | POA: Diagnosis not present

## 2021-03-22 DIAGNOSIS — G8929 Other chronic pain: Secondary | ICD-10-CM

## 2021-03-22 DIAGNOSIS — M25512 Pain in left shoulder: Secondary | ICD-10-CM | POA: Diagnosis not present

## 2021-03-25 NOTE — Progress Notes (Signed)
? ? Holly Cochran ?[facility] ?[place] ?Phone: [phone] ?  ?Assessment and Plan:   ?  ?1. Left shoulder pain, unspecified chronicity ?2. Tendinopathy of left biceps tendon ?-Chronic with exacerbation, subsequent visit ?- Overall improvement in exacerbation of left shoulder pain over the past month using conservative therapy.  Biceps tendon appears to be overall improving moderately with most prominent pain currently related to left pec ?- Can use remaining meloxicam as needed for breakthrough pain ?- Recommend continuing Tylenol as needed for day-to-day pain relief ?- Recommend starting Voltaren gel over painful areas 1-2 times per day for pain relief.  Patient had an expired Voltaren gel and was asking for refill.  Informed that it is now over-the-counter ?- Continue physical therapy ?  ?Pertinent previous records reviewed include none   ?  ?Follow Up: As needed if no improvement or worsening of symptoms ?  ?Subjective:   ?I, Holly Cochran, am serving as a Education administrator for Doctor Holly Cochran ? ?Chief Complaint: left shoulder and upper chest pain  ? ?HPI:  ?01/31/21 ?Patient is a 71 year old female complaining of left bicep and upper back pain. Patient states Shoulder pain - > two years ago broke both elbows - since then left arm has not been just right.  She has seen ortho - had l elbow injection and took meloxicam for a while and it helped.  She is still having left upper arm pain - not elbow pain -- it hurts when she flexes her arm - it feels muscular.  She has chronic left shoulder and left upper back muscular pain that has been helped by rolfing massage  but the man she goes to is out of town for an extended period of time.  She has started acupunture and it has helped some. The pain in her left shoulder upper chest area . Worse with movement. Has done heat, salon patch, meloxicam, mm relaxers . Pt does note that she always has tight upper  back  ?  ?02/28/2021 ?Patient states that she has only been to PT one time they weren't abe to get her in until last week. Brought a cream to discuss but it really helps meloxicam helped a lot still has a little pain when bringing arm across . Thinks it will take some time in PT to get it right  ?  ? 03/28/2021 ?Patient states would like a diclofenac sodium refill helps with ROM, horizontal ADDuction  she can feel but overall its much better  ?  ?Relevant Historical Information: History of bilateral elbow fracture, history of femur fracture, osteoporosis on Fosamax ? ?Additional pertinent review of systems negative. ? ? ?Current Outpatient Medications:  ?  alendronate (FOSAMAX) 70 MG tablet, TAKE 1 TABLET EVERY 7 DAYS. TAKE WITH A FULL GLASS OF WATER ON AN EMPTY STOMACH., Disp: 12 tablet, Rfl: 3 ?  Cholecalciferol (VITAMIN D3) 25 MCG (1000 UT) CAPS, Take 1,000 Units by mouth daily., Disp: , Rfl:  ?  escitalopram (LEXAPRO) 20 MG tablet, TAKE 1 TABLET BY MOUTH ONCE DAILY., Disp: 90 tablet, Rfl: 1 ?  levothyroxine (SYNTHROID) 125 MCG tablet, TAKE ONE TABLET ONCE DAILY AND TAKE 1/2 TABLET ON TUESDAY, THURSDAY AND SATURDAY, Disp: 70 tablet, Rfl: 0 ?  losartan (COZAAR) 25 MG tablet, TAKE 1 TABLET BY MOUTH ONCE DAILY., Disp: 90 tablet, Rfl: 1 ?  meloxicam (MOBIC) 15 MG tablet, Take 1 tablet (15 mg total) by mouth daily., Disp: 30 tablet,  Rfl: 1 ?  methocarbamol (ROBAXIN) 500 MG tablet, TAKE 1 TABLET EVERY 6 HOURS AS NEEDED FOR MUSCLE SPASM., Disp: 60 tablet, Rfl: 3 ?  omeprazole (PRILOSEC) 40 MG capsule, TAKE ONE CAPSULE ONCE DAILY 30 MINUTES BEFORE a meal, Disp: 90 capsule, Rfl: 0 ?  pravastatin (PRAVACHOL) 40 MG tablet, TAKE ONE TABLET DAILY AT BEDTIME., Disp: 90 tablet, Rfl: 1 ?  meloxicam (MOBIC) 15 MG tablet, Take 1 tablet (15 mg total) by mouth daily. (Patient not taking: Reported on 02/22/2021), Disp: 30 tablet, Rfl: 0 ?  Semaglutide-Weight Management 0.5 MG/0.5ML SOAJ, Inject 0.5 mg into the skin once a week for 28  days., Disp: 2 mL, Rfl: 0 ?  [START ON 04/05/2021] Semaglutide-Weight Management 1 MG/0.5ML SOAJ, Inject 1 mg into the skin once a week for 28 days., Disp: 2 mL, Rfl: 0  ? ?Objective:   ?  ?Vitals:  ? 03/28/21 0959  ?BP: 122/80  ?Pulse: 87  ?SpO2: 97%  ?Weight: 169 lb (76.7 kg)  ?Height: '5\' 5"'$  (1.651 m)  ?  ?  ?Body mass index is 28.12 kg/m?.  ?  ?Physical Exam:   ? ?Gen: Appears well, nad, nontoxic and pleasant ?Neuro:sensation intact, strength is 5/5 with df/pf/inv/ev, muscle tone wnl ?Skin: no suspicious lesion or defmority ?Psych: A&O, appropriate mood and affect ? ?Shoulder: no deformity, swelling or muscle wasting ?No scapular winging ?FF 180, abd 180, int 0, ext 90 ?TTP left pectoralis radiating to proximal bicep ?NTTP over the [place], clavicle, ac, coracoid, biceps groove, humerus, deltoid, trapezius, cervical spine ?Positive speeds, positive crossarm.  Both decreased in pain compared with prior office visit ?Neg neer, hawkings, empty can, subscap liftoff,  , obriens,   ?Neg ant drawer, sulcus sign, apprehension ?Negative Spurling's test bilat ?FROM of neck  ? ? ?Electronically signed by:  ?Holly Cochran ?[facility] ?10:32 AM 03/28/21 ?

## 2021-03-26 NOTE — Therapy (Signed)
?OUTPATIENT PHYSICAL THERAPY TREATMENT NOTE ? ? ?Patient Name: Holly Cochran ?MRN: [id number] ?DOB:March 04, 1950, 71 y.o., female ?Today's Date: 03/29/2021 ? ?PCP: Binnie Rail, MD ?REFERRING PROVIDER: Glennon Mac, DO ? ? PT End of Session - 03/29/21 1130   ? ? Visit Number 6   ? Number of Visits 9   ? Date for PT Re-Evaluation 04/19/21   ? Authorization Type [employer] MCR   ? Progress Note Due on Visit 10   ? PT Start Time 1130   ? PT Stop Time 1210   ? PT Time Calculation (min) 40 min   ? Activity Tolerance Patient tolerated treatment well   ? Behavior During Therapy [facility] for tasks assessed/performed   ? ?  ?  ? ?  ? ? ? ? ? ? ?Past Medical History:  ?Diagnosis Date  ? Common peroneal neuropathy of left lower extremity 05/20/2014  ? [place], [place] 10/14/2007  ? Qualifier: Diagnosis of  By: Linna Darner MD, Gwyndolyn Saxon    ? GERD (gastroesophageal reflux disease)   ? Hyperlipidemia   ? Osteopenia 2015  ? T score -1.1  ? Thyroid disease   ? hyperthyroid-RA I  ? ?Past Surgical History:  ?Procedure Laterality Date  ? COLONOSCOPY  2005  ?  Dr Sharlett Iles, negative  ? INTRAMEDULLARY (IM) NAIL INTERTROCHANTERIC Left 11/18/2017  ? Procedure: INTRAMEDULLARY (IM) NAIL INTERTROCHANTRIC;  Surgeon: Dorna Leitz, MD;  Location: [facility];  Service: Orthopedics;  Laterality: Left;  ? PLANTAR FASCIA SURGERY  08/2010  ? TONSILLECTOMY    ? ?Patient Active Problem List  ? Diagnosis Date Noted  ? Upper back pain on left side 01/17/2021  ? Left upper arm pain 01/17/2021  ? Breast pain, left 01/17/2021  ? TMJ arthralgia 04/14/2019  ? Essential hypertension 03/15/2019  ? Intertrochanteric fracture of left femur ([place]) 11/18/2017  ? Vitamin D deficiency 08/31/2016  ? Family history of diabetes mellitus in mother 01/30/2016  ? Anxiety and depression 03/02/2015  ? Chronically dry eyes 07/20/2014  ? Common peroneal neuropathy of left lower extremity 05/20/2014  ? GERD (gastroesophageal reflux disease) 02/08/2011  ? RHINITIS 11/24/2008  ? GANGLION OF  TENDON SHEATH 11/10/2008  ? Hyperlipidemia 08/27/2007  ? Osteoporosis 08/27/2007  ? Hypothyroidism, postradioiodine therapy 06/01/2006  ? ? ?REFERRING DIAG: M25.512 (ICD-10-CM) - Left shoulder pain, unspecified chronicity ? ?THERAPY DIAG:  ?Chronic left shoulder pain ? ?Muscle weakness (generalized) ? ?PERTINENT HISTORY: Osteoporosis, hx of BIL elbow fxs,  ? ?PRECAUTIONS: None ? ?SUBJECTIVE: I hit some golf balls yesterday and I'm feeling ok. I probably hit about 40. ? ? ?PAIN:  ?Are you having pain? No (currently) ?NPRS scale: 0/10 (currently)  ?Pain location: Lt shoulder ?PAIN TYPE: sharp ?Pain description: intermittent  ?Aggravating factors: reaching forward, swinging a golf club, carrying >15 pounds ?Relieving factors: diclofenac gel, pain medication, rest ? ? ?  ?OBJECTIVE:  ?  ?DIAGNOSTIC FINDINGS:  ?01/31/2021: DG Shoulder Left: IMPRESSION: ?Mild acromioclavicular osteoarthritis.  No acute fracture. ?  ?PATIENT SURVEYS:  ?FOTO 63%, predicted 69% in 10 visits ?03/29/2021: 64% ?  ?COGNITION: ?         Overall cognitive status: Within functional limits for tasks assessed ?                              ?SENSATION: ?         Light touch: Appears intact ?  ?POSTURE: ?Forward head, BIL shoulders; tight pecs BIL ?  ?UPPER EXTREMITY AROM/PROM: ?  ?  A/PROM Right ?02/22/2021 Left ?02/22/2021  ?Shoulder flexion WNL WNL, painful at [facility] joint  ?Shoulder abduction WNL WNL  ?Shoulder internal rotation WNL WNL  ?Shoulder external rotation WNL WNL  ?Shoulder horizontal adduction WNL WNL, painful at [facility] joint  ?Elbow flexion WNL WNL  ?Elbow extension WNL WNL  ?(Blank rows = not tested) ?  ?UPPER EXTREMITY MMT: ?  ?MMT Right ?02/22/2021 Left ?02/22/2021 Right ?03/15/2021 Left ?03/15/2021 Right ?03/29/21 Left ?03/29/21  ?Shoulder flexion 5/5 5/5 mild pain      ?Shoulder extension 5/5 5/5      ?Shoulder abduction 5/5 5/5      ?Shoulder horizontal adduction 5/5 5/5 pain      ?Shoulder internal rotation 5/5 5/5      ?Shoulder external rotation 5/5  5/5      ?Middle trapezius 5/5 4/5p!  4/5  4+/5  ?Lower trapezius 3+/5 3/5 3+/5 3+/5 3+/5 3+/5  ?Latissimus Dorsi 5/5 4/5      ?Elbow flexion 5/5 5/5      ?Elbow extension 5/5 5/5      ?Grip strength (lbs) 66 65      ?(Blank rows = not tested) ?  ?SHOULDER SPECIAL TESTS: ?          Neer's: (-) BIL ?          Hawkins-Kennedy: (-) BIL ?          Cross-body adduction with OP: (+) on Lt ?          Speed's test: (-) BIL ?          Biceps load test I and II: (-) BIL ?  ?JOINT MOBILITY TESTING:  ?GHJ mobilizations WNL in all planes BIL;  ?  ?PALPATION:  ?TTP to Lt AC joint and just inferior to [facility] joint ?  ?Functional Tests:  ?Pt able to lift 15# from waist to overhead with some difficulty and 2/10 pain ?           ?TODAY'S TREATMENT:  ?[facility] Adult PT Treatment:                                                DATE: 03/29/2021 ?Therapeutic Exercise: ?UBE level 4 x 5 mins/ 2.5 mins forward/retro  ?Seated ER GTB 2 x 15 ?Placing 3# from counter to cabinet 2 x 10 L (slightly crossing body) ?Placing 7# from counter to cabinet using B UE 2 x 10 ?Prone alternating UE/LE lift  2 x 10 BIL ?Prone ITWY's, no weight, x 10 each ?Lat pull downs 20# 3 x 10 ?Low rows 20# 3 x 10 ?High rows 20# 3x10 ?Chest press 15# 2x10 ?Sidelying open books x 10 R sidelying ?Palloff press 10# x10 BIL ? ? ?[facility] Adult PT Treatment:                                                DATE: 03/22/2021 ?Therapeutic Exercise: ?UBE level 4 x 5 mins/ 2.5 mins forward/retro  ?Corner pec stretch 3 x 30" ?Seated ER GTB 2 x 10 ?Shoulder IR towel stretch x30" BIL ?Placing 3# from counter to cabinet 2 x 10 L (slightly crossing body) ?Placing 6# from counter to cabinet using B UE 2 x 10 ?Prone alternating UE/LE  lift  2 x 10 ?Prone ITWY's, no weight, x 10 each ?Prone supermans x10 ?Lat pull downs 20# 2 x 10 ?Rows 20# 2 x 10 ?Chest press 15# 2x10 ?Sidelying open books x 10 BIL (pain in L shoulder when lying on L side) ?Palloff press 10# x10 BIL ? ? ?[facility] Adult PT Treatment:                                                 DATE: 03/15/2021 ?Therapeutic Exercise: ?UBE level 3 x 5 mins/ 2.5 mins forward/retro  ?Corner pec stretch 3 x 30" ?Seated ER GTB 2 x 10 ?Placing 3# from counter to cabinet 2 x 10 L  ?Horizontal abduction GTB 2 x 10 ?Diagonals GTB x 10 BIL ?Prone alternating UE/LE lift  2 x 10 ?Prone ITWY's, no weight, x 10 each ?Lat pull downs 20# 2 x 10 ?Rows 20# 2 x 10 ?Sidelying open books x 10 BIL ?  ?  ?  ?PATIENT EDUCATION: ?Education details: Pt educated about potential underlying pathophysiology behind her pain presentation, POC, prognosis, and HEP ?Person educated: Patient ?Education method: Explanation, Demonstration, and Handouts ?Education comprehension: verbalized understanding and returned demonstration ?  ?  ?HOME EXERCISE PROGRAM: ?Access Code: [email or web address] ?URL: [email or web address] ?Date: 02/22/2021 ?Prepared by: Vanessa Plaquemine ?  ?Exercises ?Shoulder External Rotation and Scapular Retraction with Resistance - 1 x daily - 7 x weekly - 3 sets - 10 reps - 3-sec hold ?Corner Pec Major Stretch - 1 x daily - 7 x weekly - 2 sets - 1-min hold ?Kneeling Plank with Scapular Protraction Retraction AROM - 1 x daily - 7 x weekly - 3 sets - 10 reps - 3-sec hold ?Standing Shoulder Row with Anchored Resistance - 1 x daily - 7 x weekly - 3 sets - 10 reps - 3-sec hold ?  ?  ?ASSESSMENT: ?  ?CLINICAL IMPRESSION: ?Patient presents to PT with no current pain and repots hitting golf balls yesterday with no adverse effects. She was able to complete all prescribed exercises with no increase in pain throughout session. Session today focused on increasing left shoulder and periscapular strength. She still exhibits weakness in bilateral lower trapezius muscles. Patient continues to benefit from skilled PT services and should be progressed as able to improve functional independence. ? ?  ?REHAB POTENTIAL: Good ?  ?CLINICAL DECISION MAKING: Stable/uncomplicated ?  ?EVALUATION COMPLEXITY:  Low ?  ?  ?GOALS: ?Goals reviewed with patient? Yes ?  ?SHORT TERM GOALS: ?  ?STG Name Target Date Goal status  ?1 Pt will report understanding and adherence to her HEP in order to promote independence in

## 2021-03-28 ENCOUNTER — Ambulatory Visit (INDEPENDENT_AMBULATORY_CARE_PROVIDER_SITE_OTHER): Payer: Medicare PPO | Admitting: Sports Medicine

## 2021-03-28 ENCOUNTER — Other Ambulatory Visit: Payer: Self-pay

## 2021-03-28 VITALS — BP 122/80 | HR 87 | Ht 65.0 in | Wt 169.0 lb

## 2021-03-28 DIAGNOSIS — M25512 Pain in left shoulder: Secondary | ICD-10-CM

## 2021-03-28 DIAGNOSIS — M67922 Unspecified disorder of synovium and tendon, left upper arm: Secondary | ICD-10-CM | POA: Diagnosis not present

## 2021-03-28 NOTE — Patient Instructions (Addendum)
Good to see you  ?Get Voltaren gel diclofenac cream over the counter can use 1-2 times a day over painful areas ?Use tylenol for pain relief ?Use remaining meloxicam as needed for breakthrough pain  ?Gradually re introduce activities  like golfing  ?As needed follow up  ?

## 2021-03-29 ENCOUNTER — Ambulatory Visit: Payer: Medicare PPO

## 2021-03-29 ENCOUNTER — Other Ambulatory Visit: Payer: Self-pay | Admitting: Internal Medicine

## 2021-03-29 DIAGNOSIS — G8929 Other chronic pain: Secondary | ICD-10-CM

## 2021-03-29 DIAGNOSIS — M6281 Muscle weakness (generalized): Secondary | ICD-10-CM | POA: Diagnosis not present

## 2021-03-29 DIAGNOSIS — M25512 Pain in left shoulder: Secondary | ICD-10-CM | POA: Diagnosis not present

## 2021-04-02 NOTE — Therapy (Signed)
?OUTPATIENT PHYSICAL THERAPY TREATMENT NOTE ? ? ?Patient Name: Holly Cochran ?MRN: [id number] ?DOB:27-Aug-1950, 71 y.o., female ?Today's Date: 04/05/2021 ? ?PCP: Binnie Rail, MD ?REFERRING PROVIDER: Glennon Mac, DO ? ? PT End of Session - 04/05/21 1132   ? ? Visit Number 7   ? Number of Visits 9   ? Date for PT Re-Evaluation 04/19/21   ? Authorization Type [employer] MCR   ? Authorization Time Period FOTO v6,v10   ? Progress Note Due on Visit 10   ? PT Start Time 1132   ? PT Stop Time 4132   ? PT Time Calculation (min) 43 min   ? Activity Tolerance Patient tolerated treatment well   ? Behavior During Therapy [facility] for tasks assessed/performed   ? ?  ?  ? ?  ? ? ? ? ? ? ? ?Past Medical History:  ?Diagnosis Date  ? Common peroneal neuropathy of left lower extremity 05/20/2014  ? [place], [place] 10/14/2007  ? Qualifier: Diagnosis of  By: Linna Darner MD, Gwyndolyn Saxon    ? GERD (gastroesophageal reflux disease)   ? Hyperlipidemia   ? Osteopenia 2015  ? T score -1.1  ? Thyroid disease   ? hyperthyroid-RA I  ? ?Past Surgical History:  ?Procedure Laterality Date  ? COLONOSCOPY  2005  ?  Dr Sharlett Iles, negative  ? INTRAMEDULLARY (IM) NAIL INTERTROCHANTERIC Left 11/18/2017  ? Procedure: INTRAMEDULLARY (IM) NAIL INTERTROCHANTRIC;  Surgeon: Dorna Leitz, MD;  Location: [facility];  Service: Orthopedics;  Laterality: Left;  ? PLANTAR FASCIA SURGERY  08/2010  ? TONSILLECTOMY    ? ?Patient Active Problem List  ? Diagnosis Date Noted  ? Upper back pain on left side 01/17/2021  ? Left upper arm pain 01/17/2021  ? Breast pain, left 01/17/2021  ? TMJ arthralgia 04/14/2019  ? Essential hypertension 03/15/2019  ? Intertrochanteric fracture of left femur ([place]) 11/18/2017  ? Vitamin D deficiency 08/31/2016  ? Family history of diabetes mellitus in mother 01/30/2016  ? Anxiety and depression 03/02/2015  ? Chronically dry eyes 07/20/2014  ? Common peroneal neuropathy of left lower extremity 05/20/2014  ? GERD (gastroesophageal reflux disease)  02/08/2011  ? RHINITIS 11/24/2008  ? GANGLION OF TENDON SHEATH 11/10/2008  ? Hyperlipidemia 08/27/2007  ? Osteoporosis 08/27/2007  ? Hypothyroidism, postradioiodine therapy 06/01/2006  ? ? ?REFERRING DIAG: M25.512 (ICD-10-CM) - Left shoulder pain, unspecified chronicity ? ?THERAPY DIAG:  ?Chronic left shoulder pain ? ?Muscle weakness (generalized) ? ?PERTINENT HISTORY: Osteoporosis, hx of BIL elbow fxs,  ? ?PRECAUTIONS: None ? ?SUBJECTIVE: Pt reports that her shoulder has been feeling better, especially with cross-body adduction. Pt reports that her pain is mostly recreated with combined shoulder extension and cross-body adduction. She adds she has been doing her HEP daily. ? ? ?PAIN:  ?Are you having pain? No (currently) ?NPRS scale: 0/10 (currently)  ?Pain location: Lt shoulder ?PAIN TYPE: sharp ?Pain description: intermittent  ?Aggravating factors: reaching forward, swinging a golf club, carrying >15 pounds ?Relieving factors: diclofenac gel, pain medication, rest ? ? ?  ?OBJECTIVE:  ? *Unless otherwise noted, objective information collected previously* ? ?DIAGNOSTIC FINDINGS:  ?01/31/2021: DG Shoulder Left: IMPRESSION: ?Mild acromioclavicular osteoarthritis.  No acute fracture. ?  ?PATIENT SURVEYS:  ?FOTO 63%, predicted 69% in 10 visits ?03/29/2021: 64% ?  ?COGNITION: ?         Overall cognitive status: Within functional limits for tasks assessed ?                              ?  SENSATION: ?         Light touch: Appears intact ?  ?POSTURE: ?Forward head, BIL shoulders; tight pecs BIL ?  ?UPPER EXTREMITY AROM/PROM: ?  ?A/PROM Right ?02/22/2021 Left ?02/22/2021  ?Shoulder flexion WNL WNL, painful at [facility] joint  ?Shoulder abduction WNL WNL  ?Shoulder internal rotation WNL WNL  ?Shoulder external rotation WNL WNL  ?Shoulder horizontal adduction WNL WNL, painful at [facility] joint  ?Elbow flexion WNL WNL  ?Elbow extension WNL WNL  ?(Blank rows = not tested) ?  ?UPPER EXTREMITY MMT: ?  ?MMT Right ?02/22/2021 Left ?02/22/2021  Right ?03/15/2021 Left ?03/15/2021 Right ?03/29/21 Left ?03/29/21  ?Shoulder flexion 5/5 5/5 mild pain      ?Shoulder extension 5/5 5/5      ?Shoulder abduction 5/5 5/5      ?Shoulder horizontal adduction 5/5 5/5 pain      ?Shoulder internal rotation 5/5 5/5      ?Shoulder external rotation 5/5 5/5      ?Middle trapezius 5/5 4/5p!  4/5  4+/5  ?Lower trapezius 3+/5 3/5 3+/5 3+/5 3+/5 3+/5  ?Latissimus Dorsi 5/5 4/5      ?Elbow flexion 5/5 5/5      ?Elbow extension 5/5 5/5      ?Grip strength (lbs) 66 65      ?(Blank rows = not tested) ?  ?SHOULDER SPECIAL TESTS: ?          Neer's: (-) BIL ?          Hawkins-Kennedy: (-) BIL ?          Cross-body adduction with OP: (+) on Lt ?          Speed's test: (-) BIL ?          Biceps load test I and II: (-) BIL ?  ?JOINT MOBILITY TESTING:  ?GHJ mobilizations WNL in all planes BIL;  ?  ?PALPATION:  ?TTP to Lt AC joint and just inferior to [facility] joint ?  ?Functional Tests:  ?Pt able to lift 15# from waist to overhead with some difficulty and 2/10 pain ?           ?TODAY'S TREATMENT:  ? ?[facility] Adult PT Treatment:                                                DATE: 04/05/2021 ?Therapeutic Exercise: ?Mini-lunge push-pull at [place] with two 7# cables 2x10 BIL ?Standing shoulder rolls 2x10 forward and backward ?Supine chest flies with slow, eccentric lowering 2x10 at 90d of abduction and 2x10 at 30d of abduction with 3# dumbbells ?Supine serratus punches with 3# dumbbells 2x10 ?Seated lat pull downs 25# 3 x 10 ?Seated low rows 25# 3 x 10 ?Seated high rows 25# 3x10 ?Standing PNF D2 shoulder flexion with YTB 3x10 on Lt ?Manual Therapy: ?N/A ?Neuromuscular re-ed: ?N/A ?Therapeutic Activity: ?N/A ?Modalities: ?N/A ?Self Care: ?N/A ? ? ?[place] Adult PT Treatment:                                                DATE: 03/29/2021 ?Therapeutic Exercise: ?UBE level 4 x 5 mins/ 2.5 mins forward/retro  ?Seated ER GTB 2 x 15 ?Placing 3# from counter to cabinet 2 x  10 L (slightly crossing  body) ?Placing 7# from counter to cabinet using B UE 2 x 10 ?Prone alternating UE/LE lift  2 x 10 BIL ?Prone ITWY's, no weight, x 10 each ?Lat pull downs 20# 3 x 10 ?Low rows 20# 3 x 10 ?High rows 20# 3x10 ?Chest press 15# 2x10 ?Sidelying open books x 10 R sidelying ?Palloff press 10# x10 BIL ? ? ?[facility] Adult PT Treatment:                                                DATE: 03/22/2021 ?Therapeutic Exercise: ?UBE level 4 x 5 mins/ 2.5 mins forward/retro  ?Corner pec stretch 3 x 30" ?Seated ER GTB 2 x 10 ?Shoulder IR towel stretch x30" BIL ?Placing 3# from counter to cabinet 2 x 10 L (slightly crossing body) ?Placing 6# from counter to cabinet using B UE 2 x 10 ?Prone alternating UE/LE lift  2 x 10 ?Prone ITWY's, no weight, x 10 each ?Prone supermans x10 ?Lat pull downs 20# 2 x 10 ?Rows 20# 2 x 10 ?Chest press 15# 2x10 ?Sidelying open books x 10 BIL (pain in L shoulder when lying on L side) ?Palloff press 10# x10 BIL ? ?  ?  ?  ?PATIENT EDUCATION: ?Education details: Pt educated about potential underlying pathophysiology behind her pain presentation, POC, prognosis, and HEP ?Person educated: Patient ?Education method: Explanation, Demonstration, and Handouts ?Education comprehension: verbalized understanding and returned demonstration ?  ?  ?HOME EXERCISE PROGRAM: ?Access Code: [email or web address] ?URL: [email or web address] ?Date: 02/22/2021 ?Prepared by: Vanessa Sturgis ?  ?Exercises ?Shoulder External Rotation and Scapular Retraction with Resistance - 1 x daily - 7 x weekly - 3 sets - 10 reps - 3-sec hold ?Corner Pec Major Stretch - 1 x daily - 7 x weekly - 2 sets - 1-min hold ?Kneeling Plank with Scapular Protraction Retraction AROM - 1 x daily - 7 x weekly - 3 sets - 10 reps - 3-sec hold ?Standing Shoulder Row with Anchored Resistance - 1 x daily - 7 x weekly - 3 sets - 10 reps - 3-sec hold ? ?Added: ?- Shoulder PNF D2 with Resistance (Mirrored)  - 1 x daily - 7 x weekly - 3 sets - 10 reps ?  ?   ?ASSESSMENT: ?  ?CLINICAL IMPRESSION: ?Pt responded well to all exercises today, demonstrating good form and no increase in pain. She will continue to benefit from skilled PT to address her primary impairments and return to her prior leve

## 2021-04-05 ENCOUNTER — Ambulatory Visit: Payer: Medicare PPO

## 2021-04-05 ENCOUNTER — Other Ambulatory Visit: Payer: Self-pay

## 2021-04-05 DIAGNOSIS — M25512 Pain in left shoulder: Secondary | ICD-10-CM | POA: Diagnosis not present

## 2021-04-05 DIAGNOSIS — G8929 Other chronic pain: Secondary | ICD-10-CM

## 2021-04-05 DIAGNOSIS — M6281 Muscle weakness (generalized): Secondary | ICD-10-CM

## 2021-04-13 NOTE — Therapy (Signed)
?OUTPATIENT PHYSICAL THERAPY TREATMENT NOTE ? ? ?Patient Name: Holly Cochran ?MRN: [id number] ?DOB:30-Jan-1950, 71 y.o., female ?Today's Date: 04/14/2021 ? ?PCP: Binnie Rail, MD ?REFERRING PROVIDER: Glennon Mac, DO ? ? PT End of Session - 04/14/21 1833   ? ? Visit Number 8   ? Number of Visits 9   ? Date for PT Re-Evaluation 04/19/21   ? Authorization Type [employer] MCR   ? Authorization Time Period FOTO v6,v10   ? Progress Note Due on Visit 10   ? PT Start Time 0623   ? PT Stop Time 1912   ? PT Time Calculation (min) 39 min   ? Activity Tolerance Patient tolerated treatment well   ? Behavior During Therapy [facility] for tasks assessed/performed   ? ?  ?  ? ?  ? ? ? ? ? ? ? ? ?Past Medical History:  ?Diagnosis Date  ? Common peroneal neuropathy of left lower extremity 05/20/2014  ? [place], [place] 10/14/2007  ? Qualifier: Diagnosis of  By: Linna Darner MD, Gwyndolyn Saxon    ? GERD (gastroesophageal reflux disease)   ? Hyperlipidemia   ? Osteopenia 2015  ? T score -1.1  ? Thyroid disease   ? hyperthyroid-RA I  ? ?Past Surgical History:  ?Procedure Laterality Date  ? COLONOSCOPY  2005  ?  Dr Sharlett Iles, negative  ? INTRAMEDULLARY (IM) NAIL INTERTROCHANTERIC Left 11/18/2017  ? Procedure: INTRAMEDULLARY (IM) NAIL INTERTROCHANTRIC;  Surgeon: Dorna Leitz, MD;  Location: [facility];  Service: Orthopedics;  Laterality: Left;  ? PLANTAR FASCIA SURGERY  08/2010  ? TONSILLECTOMY    ? ?Patient Active Problem List  ? Diagnosis Date Noted  ? Upper back pain on left side 01/17/2021  ? Left upper arm pain 01/17/2021  ? Breast pain, left 01/17/2021  ? TMJ arthralgia 04/14/2019  ? Essential hypertension 03/15/2019  ? Intertrochanteric fracture of left femur ([place]) 11/18/2017  ? Vitamin D deficiency 08/31/2016  ? Family history of diabetes mellitus in mother 01/30/2016  ? Anxiety and depression 03/02/2015  ? Chronically dry eyes 07/20/2014  ? Common peroneal neuropathy of left lower extremity 05/20/2014  ? GERD (gastroesophageal reflux disease)  02/08/2011  ? RHINITIS 11/24/2008  ? GANGLION OF TENDON SHEATH 11/10/2008  ? Hyperlipidemia 08/27/2007  ? Osteoporosis 08/27/2007  ? Hypothyroidism, postradioiodine therapy 06/01/2006  ? ? ?REFERRING DIAG: M25.512 (ICD-10-CM) - Left shoulder pain, unspecified chronicity ? ?THERAPY DIAG:  ?Chronic left shoulder pain ? ?Muscle weakness (generalized) ? ?PERTINENT HISTORY: Osteoporosis, hx of BIL elbow fxs,  ? ?PRECAUTIONS: None ? ?SUBJECTIVE: Pt reports light Lt anterior shoulder pain today. She reports varied adherence to her HEP due to being out of town without her exercise band.  ? ? ?PAIN:  ?Are you having pain? No (currently) ?NPRS scale: 1-2/10 (currently)  ?Pain location: Lt shoulder ?PAIN TYPE: sharp ?Pain description: intermittent  ?Aggravating factors: reaching forward, swinging a golf club, carrying >15 pounds ?Relieving factors: diclofenac gel, pain medication, rest ? ? ?  ?OBJECTIVE:  ? *Unless otherwise noted, objective information collected previously* ? ?DIAGNOSTIC FINDINGS:  ?01/31/2021: DG Shoulder Left: IMPRESSION: ?Mild acromioclavicular osteoarthritis.  No acute fracture. ?  ?PATIENT SURVEYS:  ?FOTO 63%, predicted 69% in 10 visits ?03/29/2021: 64% ?  ?COGNITION: ?         Overall cognitive status: Within functional limits for tasks assessed ?                              ?SENSATION: ?  Light touch: Appears intact ?  ?POSTURE: ?Forward head, BIL shoulders; tight pecs BIL ?  ?UPPER EXTREMITY AROM/PROM: ?  ?A/PROM Right ?02/22/2021 Left ?02/22/2021  ?Shoulder flexion WNL WNL, painful at [facility] joint  ?Shoulder abduction WNL WNL  ?Shoulder internal rotation WNL WNL  ?Shoulder external rotation WNL WNL  ?Shoulder horizontal adduction WNL WNL, painful at [facility] joint  ?Elbow flexion WNL WNL  ?Elbow extension WNL WNL  ?(Blank rows = not tested) ?  ?UPPER EXTREMITY MMT: ?  ?MMT Right ?02/22/2021 Left ?02/22/2021 Right ?03/15/2021 Left ?03/15/2021 Right ?03/29/21 Left ?03/29/21  ?Shoulder flexion 5/5 5/5 mild pain       ?Shoulder extension 5/5 5/5      ?Shoulder abduction 5/5 5/5      ?Shoulder horizontal adduction 5/5 5/5 pain      ?Shoulder internal rotation 5/5 5/5      ?Shoulder external rotation 5/5 5/5      ?Middle trapezius 5/5 4/5p!  4/5  4+/5  ?Lower trapezius 3+/5 3/5 3+/5 3+/5 3+/5 3+/5  ?Latissimus Dorsi 5/5 4/5      ?Elbow flexion 5/5 5/5      ?Elbow extension 5/5 5/5      ?Grip strength (lbs) 66 65      ?(Blank rows = not tested) ?  ?SHOULDER SPECIAL TESTS: ?          Neer's: (-) BIL ?          Hawkins-Kennedy: (-) BIL ?          Cross-body adduction with OP: (+) on Lt ?          Speed's test: (-) BIL ?          Biceps load test I and II: (-) BIL ?  ?JOINT MOBILITY TESTING:  ?GHJ mobilizations WNL in all planes BIL;  ?  ?PALPATION:  ?TTP to Lt AC joint and just inferior to [facility] joint ?  ?Functional Tests:  ?Pt able to lift 15# from waist to overhead with some difficulty and 2/10 pain ?           ?TODAY'S TREATMENT:  ? ?[place] Adult PT Treatment:                                                DATE: 04/14/2021 ?Therapeutic Exercise: ?UBE level 1 x46mn forward/ x2 min backward while collecting subjective information ?Seated chest flies with slow eccentric return with 10# 1x8, 15# 2x8 ?Standing Lt shoulder PNF D2 shoulder flexion with YTB 2x10 ?Standing shoulder rolls 2x10 forward and backward ?Lt shoulder scaption stepwise lift from waist to shoulder-level shelf and then to overhead shelf and back again with 3# dumbbell 2x8 ?Standing mini arm circles by side 2x20 ?Bird dogs 2x10 BIL ?Cat cows 2x10 ?Manual Therapy: ?N/A ?Neuromuscular re-ed: ?N/A ?Therapeutic Activity: ?N/A ?Modalities: ?N/A ?Self Care: ?N/A ? ? ?[place] [place] [facility]:                                                DATE: 04/05/2021 ?Therapeutic Exercise: ?Mini-lunge push-pull at [place] two 7# cables 2x10 BIL ?Standing shoulder rolls 2x10 forward and backward ?Supine chest flies with slow, eccentric lowering 2x10 at 90d of abduction and 2x10  at 30d of abduction with 3# dumbbells ?Supine serratus punches with 3# dumbbells 2x10 ?Seated lat pull downs 25# 3 x 10 ?Seated low rows 25# 3 x 10 ?Seated high rows 25# 3x10 ?Standing PNF D2 shoulder flexion with YTB 3x10 on Lt ?Manual Therapy: ?N/A ?Neuromuscular re-ed: ?N/A ?Therapeutic Activity: ?N/A ?Modalities: ?N/A ?Self Care: ?N/A ? ? ?[place] Adult PT Treatment:                                                DATE: 03/29/2021 ?Therapeutic Exercise: ?UBE level 4 x 5 mins/ 2.5 mins forward/retro  ?Seated ER GTB 2 x 15 ?Placing 3# from counter to cabinet 2 x 10 L (slightly crossing body) ?Placing 7# from counter to cabinet using B UE 2 x 10 ?Prone alternating UE/LE lift  2 x 10 BIL ?Prone ITWY's, no weight, x 10 each ?Lat pull downs 20# 3 x 10 ?Low rows 20# 3 x 10 ?High rows 20# 3x10 ?Chest press 15# 2x10 ?Sidelying open books x 10 R sidelying ?Palloff press 10# x10 BIL ? ? ? ? ?  ?  ?  ?PATIENT EDUCATION: ?Education details: Pt educated about potential underlying pathophysiology behind her pain presentation, POC, prognosis, and HEP ?Person educated: Patient ?Education method: Explanation, Demonstration, and Handouts ?Education comprehension: verbalized understanding and returned demonstration ?  ?  ?HOME EXERCISE PROGRAM: ?Access Code: [email or web address] ?URL: [email or web address] ?Date: 02/22/2021 ?Prepared by: Vanessa Lonoke ?  ?Exercises ?Shoulder External Rotation and Scapular Retraction with Resistance - 1 x daily - 7 x weekly - 3 sets - 10 reps - 3-sec hold ?Corner Pec Major Stretch - 1 x daily - 7 x weekly - 2 sets - 1-min hold ?Kneeling Plank with Scapular Protraction Retraction AROM - 1 x daily - 7 x weekly - 3 sets - 10 reps - 3-sec hold ?Standing Shoulder Row with Anchored Resistance - 1 x daily - 7 x weekly - 3 sets - 10 reps - 3-sec hold ? ?Added: ?- Shoulder PNF D2 with Resistance (Mirrored)  - 1 x daily - 7 x weekly - 3 sets - 10 reps ?  ?  ?ASSESSMENT: ?  ?CLINICAL IMPRESSION: ?Pt responded  well to all interventions today, demonstrating good form and no increase in pain with selected exercises. She tolerated resisted horizontal adduction during chest fly activity. Pt will continue to benefit fr

## 2021-04-14 ENCOUNTER — Other Ambulatory Visit: Payer: Self-pay | Admitting: Internal Medicine

## 2021-04-14 ENCOUNTER — Ambulatory Visit: Payer: Medicare PPO | Attending: Sports Medicine

## 2021-04-14 DIAGNOSIS — G8929 Other chronic pain: Secondary | ICD-10-CM | POA: Insufficient documentation

## 2021-04-14 DIAGNOSIS — M25512 Pain in left shoulder: Secondary | ICD-10-CM | POA: Insufficient documentation

## 2021-04-14 DIAGNOSIS — E890 Postprocedural hypothyroidism: Secondary | ICD-10-CM

## 2021-04-14 DIAGNOSIS — M6281 Muscle weakness (generalized): Secondary | ICD-10-CM | POA: Insufficient documentation

## 2021-04-19 ENCOUNTER — Ambulatory Visit: Payer: Medicare PPO

## 2021-04-19 DIAGNOSIS — G8929 Other chronic pain: Secondary | ICD-10-CM | POA: Diagnosis not present

## 2021-04-19 DIAGNOSIS — H25813 Combined forms of age-related cataract, bilateral: Secondary | ICD-10-CM | POA: Diagnosis not present

## 2021-04-19 DIAGNOSIS — M25512 Pain in left shoulder: Secondary | ICD-10-CM | POA: Diagnosis not present

## 2021-04-19 DIAGNOSIS — H5203 Hypermetropia, bilateral: Secondary | ICD-10-CM | POA: Diagnosis not present

## 2021-04-19 DIAGNOSIS — H524 Presbyopia: Secondary | ICD-10-CM | POA: Diagnosis not present

## 2021-04-19 DIAGNOSIS — M6281 Muscle weakness (generalized): Secondary | ICD-10-CM | POA: Diagnosis not present

## 2021-04-19 DIAGNOSIS — H52223 Regular astigmatism, bilateral: Secondary | ICD-10-CM | POA: Diagnosis not present

## 2021-04-19 NOTE — Therapy (Signed)
?OUTPATIENT PHYSICAL THERAPY TREATMENT NOTE/ DISCHARGE SUMMARY ? ? ?Patient Name: Holly Cochran ?MRN: [id number] ?DOB:1950-09-18, 71 y.o., female ?Today's Date: 04/19/2021 ? ?PCP: Binnie Rail, MD ?REFERRING PROVIDER: Glennon Mac, DO ? ? PT End of Session - 04/19/21 1159   ? ? Visit Number 9   ? Number of Visits 9   ? Date for PT Re-Evaluation 04/19/21   ? Authorization Type [employer] MCR   ? Authorization Time Period FOTO v6,v10   ? Progress Note Due on Visit 10   ? PT Start Time 1130   ? PT Stop Time 1208   ? PT Time Calculation (min) 38 min   ? Activity Tolerance Patient tolerated treatment well   ? Behavior During Therapy [facility] for tasks assessed/performed   ? ?  ?  ? ?  ? ? ? ? ? ? ? ? ? ?Past Medical History:  ?Diagnosis Date  ? Common peroneal neuropathy of left lower extremity 05/20/2014  ? [place], [place] 10/14/2007  ? Qualifier: Diagnosis of  By: Linna Darner MD, Gwyndolyn Saxon    ? GERD (gastroesophageal reflux disease)   ? Hyperlipidemia   ? Osteopenia 2015  ? T score -1.1  ? Thyroid disease   ? hyperthyroid-RA I  ? ?Past Surgical History:  ?Procedure Laterality Date  ? COLONOSCOPY  2005  ?  Dr Sharlett Iles, negative  ? INTRAMEDULLARY (IM) NAIL INTERTROCHANTERIC Left 11/18/2017  ? Procedure: INTRAMEDULLARY (IM) NAIL INTERTROCHANTRIC;  Surgeon: Dorna Leitz, MD;  Location: [facility];  Service: Orthopedics;  Laterality: Left;  ? PLANTAR FASCIA SURGERY  08/2010  ? TONSILLECTOMY    ? ?Patient Active Problem List  ? Diagnosis Date Noted  ? Upper back pain on left side 01/17/2021  ? Left upper arm pain 01/17/2021  ? Breast pain, left 01/17/2021  ? TMJ arthralgia 04/14/2019  ? Essential hypertension 03/15/2019  ? Intertrochanteric fracture of left femur ([place]) 11/18/2017  ? Vitamin D deficiency 08/31/2016  ? Family history of diabetes mellitus in mother 01/30/2016  ? Anxiety and depression 03/02/2015  ? Chronically dry eyes 07/20/2014  ? Common peroneal neuropathy of left lower extremity 05/20/2014  ? GERD (gastroesophageal  reflux disease) 02/08/2011  ? RHINITIS 11/24/2008  ? GANGLION OF TENDON SHEATH 11/10/2008  ? Hyperlipidemia 08/27/2007  ? Osteoporosis 08/27/2007  ? Hypothyroidism, postradioiodine therapy 06/01/2006  ? ? ?REFERRING DIAG: M25.512 (ICD-10-CM) - Left shoulder pain, unspecified chronicity ? ?THERAPY DIAG:  ?Chronic left shoulder pain ? ?Muscle weakness (generalized) ? ?PERTINENT HISTORY: Osteoporosis, hx of BIL elbow fxs,  ? ?PRECAUTIONS: None ? ?SUBJECTIVE: Pt denies any pain today, adding that she has continued to do her HEP. She reports feeling ready for discharge at this time. ? ? ?PAIN:  ?Are you having pain? No (currently) ?NPRS scale: 0/10 (currently)  ?Pain location: Lt shoulder ?PAIN TYPE: sharp ?Pain description: intermittent  ?Aggravating factors: reaching forward, swinging a golf club, carrying >15 pounds ?Relieving factors: diclofenac gel, pain medication, rest ? ? ?  ?OBJECTIVE:  ? *Unless otherwise noted, objective information collected previously* ? ?DIAGNOSTIC FINDINGS:  ?01/31/2021: DG Shoulder Left: IMPRESSION: ?Mild acromioclavicular osteoarthritis.  No acute fracture. ?  ?PATIENT SURVEYS:  ?FOTO 63%, predicted 69% in 10 visits ?03/29/2021: 64% ?04/19/2021: 70% ?  ?COGNITION: ?         Overall cognitive status: Within functional limits for tasks assessed ?                              ?  SENSATION: ?         Light touch: Appears intact ?  ?POSTURE: ?Forward head, BIL shoulders; tight pecs BIL ?  ?UPPER EXTREMITY AROM/PROM: ?  ?A/PROM Right ?02/22/2021 Left ?02/22/2021 Left ?04/19/2021  ?Shoulder flexion WNL WNL, painful at [facility] joint WNL  ?Shoulder abduction WNL WNL   ?Shoulder internal rotation WNL WNL   ?Shoulder external rotation WNL WNL   ?Shoulder horizontal adduction WNL WNL, painful at [facility] joint WNL, 2-3/10 pain at Lt ACJ  ?Elbow flexion WNL WNL   ?Elbow extension WNL WNL   ?(Blank rows = not tested) ?  ?UPPER EXTREMITY MMT: ?  ?MMT Right ?02/22/2021 Left ?02/22/2021 Right ?03/15/2021 Left ?03/15/2021  Right ?03/29/21 Left ?03/29/21 Right ?04/19/2021 Left ?04/19/2021  ?Shoulder flexion 5/5 5/5 mild pain        ?Shoulder extension 5/5 5/5        ?Shoulder abduction 5/5 5/5        ?Shoulder horizontal adduction 5/5 5/5 pain        ?Shoulder internal rotation 5/5 5/5        ?Shoulder external rotation 5/5 5/5        ?Middle trapezius 5/5 4/5p!  4/5  4+/5 5/5 5/5  ?Lower trapezius 3+/5 3/5 3+/5 3+/5 3+/5 3+/5 4+/5 4+/5  ?Latissimus Dorsi 5/5 4/5     5/5 5/5  ?Elbow flexion 5/5 5/5        ?Elbow extension 5/5 5/5        ?Grip strength (lbs) 66 65        ?(Blank rows = not tested) ?  ?SHOULDER SPECIAL TESTS: ?          Neer's: (-) BIL ?          Hawkins-Kennedy: (-) BIL ?          Cross-body adduction with OP: (+) on Lt ?          Speed's test: (-) BIL ?          Biceps load test I and II: (-) BIL ?  ?JOINT MOBILITY TESTING:  ?GHJ mobilizations WNL in all planes BIL;  ?  ?PALPATION:  ?TTP to Lt AC joint and just inferior to [facility] joint ?  ?Functional Tests:  ?Pt able to lift 15# from waist to overhead with some difficulty and 2/10 pain ? ?04/19/2021: Pt able to lift 25# kettlebell overhead with BUE with 0/10 pain ?           ?TODAY'S TREATMENT:  ? ?[place] Adult PT Treatment:                                                DATE: 04/19/2021 ?Therapeutic Exercise: ?Push-up on table 2x8 ?Supine chest flies with 2# dumbbells 3x10 ?Seated trunk extension with hands behind head pec stretch x2 minutes ?Manual Therapy: ?N/A ?Neuromuscular re-ed: ?N/A ?Therapeutic Activity: ?Re-assessment of objective data with pt education on progress made in PT ?Re-administration of FOTO with education ?Modalities: ?N/A ?Self Care: ?N/A ? ? ?[place] Adult PT Treatment:                                                DATE: 04/14/2021 ?Therapeutic Exercise: ?UBE level 1 x83mn forward/ x2  min backward while collecting subjective information ?Seated chest flies with slow eccentric return with 10# 1x8, 15# 2x8 ?Standing Lt shoulder PNF D2 shoulder flexion with YTB  2x10 ?Standing shoulder rolls 2x10 forward and backward ?Lt shoulder scaption stepwise lift from waist to shoulder-level shelf and then to overhead shelf and back again with 3# dumbbell 2x8 ?Standing mini arm circles by side 2x20 ?Bird dogs 2x10 BIL ?Cat cows 2x10 ?Manual Therapy: ?N/A ?Neuromuscular re-ed: ?N/A ?Therapeutic Activity: ?N/A ?Modalities: ?N/A ?Self Care: ?N/A ? ? ?[place] Adult PT Treatment:                                                DATE: 04/05/2021 ?Therapeutic Exercise: ?Mini-lunge push-pull at [place] with two 7# cables 2x10 BIL ?Standing shoulder rolls 2x10 forward and backward ?Supine chest flies with slow, eccentric lowering 2x10 at 90d of abduction and 2x10 at 30d of abduction with 3# dumbbells ?Supine serratus punches with 3# dumbbells 2x10 ?Seated lat pull downs 25# 3 x 10 ?Seated low rows 25# 3 x 10 ?Seated high rows 25# 3x10 ?Standing PNF D2 shoulder flexion with YTB 3x10 on Lt ?Manual Therapy: ?N/A ?Neuromuscular re-ed: ?N/A ?Therapeutic Activity: ?N/A ?Modalities: ?N/A ?Self Care: ?N/A ? ? ? ? ?  ?  ?  ?PATIENT EDUCATION: ?Education details: Pt educated about potential underlying pathophysiology behind her pain presentation, POC, prognosis, and HEP ?Person educated: Patient ?Education method: Explanation, Demonstration, and Handouts ?Education comprehension: verbalized understanding and returned demonstration ?  ?  ?HOME EXERCISE PROGRAM: ?Access Code: [email or web address] ?URL: [email or web address] ?Date: 02/22/2021 ?Prepared by: Vanessa  ?  ?Exercises ?Shoulder External Rotation and Scapular Retraction with Resistance - 1 x daily - 7 x weekly - 3 sets - 10 reps - 3-sec hold ?Corner Pec Major Stretch - 1 x daily - 7 x weekly - 2 sets - 1-min hold ?Kneeling Plank with Scapular Protraction Retraction AROM - 1 x daily - 7 x weekly - 3 sets - 10 reps - 3-sec hold ?Standing Shoulder Row with Anchored Resistance - 1 x daily - 7 x weekly - 3 sets - 10 reps - 3-sec  hold ? ?Added: ?- Shoulder PNF D2 with Resistance (Mirrored)  - 1 x daily - 7 x weekly - 3 sets - 10 reps ? ?Added 04/19/2021: ?- Push Up on Table  - 1 x daily - 7 x weekly - 3 sets - 8 reps ?- Supine Chest Flys  - 1

## 2021-04-22 ENCOUNTER — Other Ambulatory Visit: Payer: Self-pay

## 2021-04-22 ENCOUNTER — Telehealth: Payer: Self-pay

## 2021-04-22 DIAGNOSIS — E890 Postprocedural hypothyroidism: Secondary | ICD-10-CM

## 2021-04-22 MED ORDER — LEVOTHYROXINE SODIUM 125 MCG PO TABS: ORAL_TABLET | ORAL | 1 refills | Status: DC

## 2021-04-22 NOTE — Telephone Encounter (Signed)
Refill sent in for patient today. ?

## 2021-04-22 NOTE — Telephone Encounter (Signed)
Pt is requesting a a new Rx for levothyroxine (SYNTHROID) 125 MCG tablet. ? ?Pt spilled medication while out of town and only has 2 pills remaining.  ? ?Pharm[facility] ?[facility] [place]  [place]/17/21 ? ? ? ?

## 2021-05-03 DIAGNOSIS — M9901 Segmental and somatic dysfunction of cervical region: Secondary | ICD-10-CM | POA: Diagnosis not present

## 2021-05-03 DIAGNOSIS — M5412 Radiculopathy, cervical region: Secondary | ICD-10-CM | POA: Diagnosis not present

## 2021-05-04 DIAGNOSIS — M5412 Radiculopathy, cervical region: Secondary | ICD-10-CM | POA: Diagnosis not present

## 2021-05-04 DIAGNOSIS — M9901 Segmental and somatic dysfunction of cervical region: Secondary | ICD-10-CM | POA: Diagnosis not present

## 2021-05-09 DIAGNOSIS — M5412 Radiculopathy, cervical region: Secondary | ICD-10-CM | POA: Diagnosis not present

## 2021-05-09 DIAGNOSIS — M9901 Segmental and somatic dysfunction of cervical region: Secondary | ICD-10-CM | POA: Diagnosis not present

## 2021-05-12 ENCOUNTER — Encounter: Payer: Self-pay | Admitting: Internal Medicine

## 2021-05-12 DIAGNOSIS — M9901 Segmental and somatic dysfunction of cervical region: Secondary | ICD-10-CM | POA: Diagnosis not present

## 2021-05-12 DIAGNOSIS — M5412 Radiculopathy, cervical region: Secondary | ICD-10-CM | POA: Diagnosis not present

## 2021-05-13 ENCOUNTER — Other Ambulatory Visit: Payer: Self-pay

## 2021-05-13 MED ORDER — SEMAGLUTIDE-WEIGHT MANAGEMENT 1 MG/0.5ML ~~LOC~~ SOAJ: 1.0000 mg | SUBCUTANEOUS | 2 refills | Status: DC

## 2021-05-16 NOTE — Patient Instructions (Addendum)
? ? ?  Medications changes include :   none ? ? ?Your prescription(s) have been sent to your pharmacy.  ? ? ? ?

## 2021-05-16 NOTE — Progress Notes (Signed)
? ? ?Subjective:  ? ? Patient ID: Holly Cochran, female    DOB: 1950-05-19, 71 y.o.   MRN: [id number] ? ? ? ? ? ?HPI ?Holly Cochran is here for  ?Chief Complaint  ?Patient presents with  ? Results  ?  Test results  ? ? ?Shoulders always tight.  This is chronic and ongoing.  When she does some deep tissue work it often helps, but it is hard to maintain.  She has taken the meloxicam and methocarbamol in the past and they helped some. ? ?Had [facility] screening recently and wanted to review the results. ? ? ?Medications and allergies reviewed with patient and updated if appropriate. ? ?Current Outpatient Medications on File Prior to Visit  ?Medication Sig Dispense Refill  ? alendronate (FOSAMAX) 70 MG tablet TAKE 1 TABLET EVERY 7 DAYS. TAKE WITH A FULL GLASS OF WATER ON AN EMPTY STOMACH. 12 tablet 3  ? Cholecalciferol (VITAMIN D3) 25 MCG (1000 UT) CAPS Take 1,000 Units by mouth daily.    ? escitalopram (LEXAPRO) 20 MG tablet TAKE 1 TABLET BY MOUTH ONCE DAILY. 90 tablet 1  ? levothyroxine (SYNTHROID) 125 MCG tablet TAKE ONE TABLET ONCE DAILY AND TAKE 1/2 TABLET ON TUESDAY, THURSDAY AND SATURDAY 70 tablet 1  ? losartan (COZAAR) 25 MG tablet TAKE 1 TABLET BY MOUTH ONCE DAILY. 90 tablet 1  ? omeprazole (PRILOSEC) 40 MG capsule TAKE ONE CAPSULE BY MOUTH ONCE DAILY 30 MINUTES BEFORE A MEAL 90 capsule 0  ? pravastatin (PRAVACHOL) 40 MG tablet TAKE ONE TABLET DAILY AT BEDTIME. 90 tablet 1  ? [START ON 07/10/2021] Semaglutide-Weight Management 1 MG/0.5ML SOAJ Inject 1 mg into the skin once a week. 2 mL 2  ? ?No current facility-administered medications on file prior to visit.  ? ? ?Review of Systems ? ?   ?Objective:  ? ?Vitals:  ? 05/17/21 0812  ?BP: 116/76  ?Pulse: 82  ?Temp: 98 ?F (36.7 ?C)  ?SpO2: 96%  ? ?Filed Weights  ? 05/17/21 4944  ?Weight: 166 lb (75.3 kg)  ? ?Body mass index is 27.62 kg/m?. ? ?BP Readings from Last 3 Encounters:  ?05/17/21 116/76  ?03/28/21 122/80  ?02/28/21 118/80  ? ? ?Wt Readings from Last 3 Encounters:   ?05/17/21 166 lb (75.3 kg)  ?03/28/21 169 lb (76.7 kg)  ?02/28/21 171 lb (77.6 kg)  ? ? ? ?  05/17/2021  ? 10:47 AM 12/22/2020  ?  8:49 AM 10/02/2019  ?  1:22 PM 03/14/2019  ?  2:54 PM 01/03/2017  ?  8:08 AM  ?Depression screen PHQ 2/9  ?Decreased Interest 0 0 0 0 0  ?Down, Depressed, Hopeless 0 0 0 0 0  ?PHQ - 2 Score 0 0 0 0 0  ?Altered sleeping 0      ?Tired, decreased energy 1      ?Change in appetite 0      ?Feeling bad or failure about yourself  0      ?Trouble concentrating 0      ?Moving slowly or fidgety/restless 0      ?Suicidal thoughts 0      ?PHQ-9 Score 1      ?Difficult doing work/chores Not difficult at all      ? ? ? ?   ? View : No data to display.  ?  ?  ?  ? ? ? ? ?  ?Physical Exam ?Constitutional: She appears well-developed and well-nourished. No distress.  ?HENT:  ?Head: Normocephalic and atraumatic.  ?Skin:  Skin is warm and dry. She is not diaphoretic.  ?Psychiatric: She has a normal mood and affect. Her behavior is normal.  ? ? ? ?Lab Results  ?Component Value Date  ? WBC 8.5 05/20/2020  ? HGB 14.8 05/20/2020  ? HCT 44.2 05/20/2020  ? PLT 342.0 05/20/2020  ? GLUCOSE 86 05/20/2020  ? CHOL 140 05/20/2020  ? TRIG 171.0 (H) 05/20/2020  ? HDL 40.30 05/20/2020  ? [place] 66 05/20/2020  ? ALT 18 05/20/2020  ? AST 18 05/20/2020  ? NA 138 05/20/2020  ? K 5.1 05/20/2020  ? CL 102 05/20/2020  ? CREATININE 0.73 05/20/2020  ? BUN 17 05/20/2020  ? CO2 30 05/20/2020  ? TSH 2.00 05/20/2020  ? HGBA1C 5.7 05/20/2020  ? ? ?Review of the [facility] screening and tests with her. ? ?   ?Assessment & Plan:  ? ? ? ? ?See Problem List for Assessment and Plan of chronic medical problems. ? ? ? ? ?

## 2021-05-17 ENCOUNTER — Encounter: Payer: Self-pay | Admitting: Internal Medicine

## 2021-05-17 ENCOUNTER — Ambulatory Visit: Payer: Medicare PPO | Admitting: Internal Medicine

## 2021-05-17 VITALS — BP 116/76 | HR 82 | Temp 98.0°F | Ht 65.0 in | Wt 166.0 lb

## 2021-05-17 DIAGNOSIS — M5412 Radiculopathy, cervical region: Secondary | ICD-10-CM | POA: Diagnosis not present

## 2021-05-17 DIAGNOSIS — Z0000 Encounter for general adult medical examination without abnormal findings: Secondary | ICD-10-CM

## 2021-05-17 DIAGNOSIS — E7849 Other hyperlipidemia: Secondary | ICD-10-CM | POA: Diagnosis not present

## 2021-05-17 DIAGNOSIS — I10 Essential (primary) hypertension: Secondary | ICD-10-CM | POA: Diagnosis not present

## 2021-05-17 DIAGNOSIS — F419 Anxiety disorder, unspecified: Secondary | ICD-10-CM | POA: Diagnosis not present

## 2021-05-17 DIAGNOSIS — M810 Age-related osteoporosis without current pathological fracture: Secondary | ICD-10-CM | POA: Diagnosis not present

## 2021-05-17 DIAGNOSIS — M9901 Segmental and somatic dysfunction of cervical region: Secondary | ICD-10-CM | POA: Diagnosis not present

## 2021-05-17 DIAGNOSIS — K219 Gastro-esophageal reflux disease without esophagitis: Secondary | ICD-10-CM | POA: Diagnosis not present

## 2021-05-17 DIAGNOSIS — E890 Postprocedural hypothyroidism: Secondary | ICD-10-CM

## 2021-05-17 DIAGNOSIS — E559 Vitamin D deficiency, unspecified: Secondary | ICD-10-CM

## 2021-05-17 DIAGNOSIS — F32A Depression, unspecified: Secondary | ICD-10-CM

## 2021-05-17 MED ORDER — MELOXICAM 15 MG PO TABS: 15.0000 mg | ORAL_TABLET | Freq: Every day | ORAL | 1 refills | Status: DC

## 2021-05-17 MED ORDER — METHOCARBAMOL 500 MG PO TABS: ORAL_TABLET | ORAL | 3 refills | Status: DC

## 2021-05-17 MED ORDER — ASPIRIN 81 MG PO TBEC: 81.0000 mg | DELAYED_RELEASE_TABLET | Freq: Every day | ORAL | 12 refills | Status: AC

## 2021-05-17 NOTE — Assessment & Plan Note (Signed)
Chronic ?GERD controlled ?Continue omeprazole 40 mg daily-discussed that she can try taking this every other day to see if she can reduce the dose ?

## 2021-05-17 NOTE — Assessment & Plan Note (Signed)
Chronic ?Had recent blood work done through [facility] well controlled ?Continue pravastatin 40 mg daily ?Continue regular exercise, healthy diet ?

## 2021-05-17 NOTE — Assessment & Plan Note (Signed)
Chronic °Continue vitamin D daily °

## 2021-05-17 NOTE — Assessment & Plan Note (Signed)
Chronic Blood pressure well-controlled Continue losartan 25 mg daily 

## 2021-05-17 NOTE — Assessment & Plan Note (Signed)
Chronic ?Started fosamax in 2021 - plan x 5 years ?Continue Fosamax 70 mg weekly ?Continue regular exercise ?Continue calcium and vitamin d daily ?dexa due - she would like to wait until she is on fosamax longer ?

## 2021-05-17 NOTE — Assessment & Plan Note (Signed)
Chronic ?Clinically euthyroid ?Just had blood work done through [facility] so do not really need to get blood work done at this time, but will need thyroid blood work done at some point this year ?Continue levothyroxine 1 tablet 4 days a week, half tablet 3 days a week ?

## 2021-05-17 NOTE — Assessment & Plan Note (Signed)
Chronic Controlled, stable Continue lexapro 20 mg daily  

## 2021-05-19 DIAGNOSIS — M9901 Segmental and somatic dysfunction of cervical region: Secondary | ICD-10-CM | POA: Diagnosis not present

## 2021-05-19 DIAGNOSIS — M5412 Radiculopathy, cervical region: Secondary | ICD-10-CM | POA: Diagnosis not present

## 2021-05-20 DIAGNOSIS — M5412 Radiculopathy, cervical region: Secondary | ICD-10-CM | POA: Diagnosis not present

## 2021-05-20 DIAGNOSIS — M9901 Segmental and somatic dysfunction of cervical region: Secondary | ICD-10-CM | POA: Diagnosis not present

## 2021-05-31 DIAGNOSIS — M5412 Radiculopathy, cervical region: Secondary | ICD-10-CM | POA: Diagnosis not present

## 2021-05-31 DIAGNOSIS — M9901 Segmental and somatic dysfunction of cervical region: Secondary | ICD-10-CM | POA: Diagnosis not present

## 2021-06-02 DIAGNOSIS — M5412 Radiculopathy, cervical region: Secondary | ICD-10-CM | POA: Diagnosis not present

## 2021-06-02 DIAGNOSIS — M9901 Segmental and somatic dysfunction of cervical region: Secondary | ICD-10-CM | POA: Diagnosis not present

## 2021-06-07 DIAGNOSIS — M5412 Radiculopathy, cervical region: Secondary | ICD-10-CM | POA: Diagnosis not present

## 2021-06-07 DIAGNOSIS — M9901 Segmental and somatic dysfunction of cervical region: Secondary | ICD-10-CM | POA: Diagnosis not present

## 2021-06-14 DIAGNOSIS — M9901 Segmental and somatic dysfunction of cervical region: Secondary | ICD-10-CM | POA: Diagnosis not present

## 2021-06-14 DIAGNOSIS — M5412 Radiculopathy, cervical region: Secondary | ICD-10-CM | POA: Diagnosis not present

## 2021-06-21 DIAGNOSIS — M5412 Radiculopathy, cervical region: Secondary | ICD-10-CM | POA: Diagnosis not present

## 2021-06-21 DIAGNOSIS — M9901 Segmental and somatic dysfunction of cervical region: Secondary | ICD-10-CM | POA: Diagnosis not present

## 2021-06-22 ENCOUNTER — Encounter: Payer: Self-pay | Admitting: Internal Medicine

## 2021-06-23 ENCOUNTER — Other Ambulatory Visit: Payer: Self-pay

## 2021-06-23 MED ORDER — SEMAGLUTIDE-WEIGHT MANAGEMENT 1 MG/0.5ML ~~LOC~~ SOAJ: 1.0000 mg | SUBCUTANEOUS | 0 refills | Status: DC

## 2021-06-28 DIAGNOSIS — M9901 Segmental and somatic dysfunction of cervical region: Secondary | ICD-10-CM | POA: Diagnosis not present

## 2021-06-28 DIAGNOSIS — M5412 Radiculopathy, cervical region: Secondary | ICD-10-CM | POA: Diagnosis not present

## 2021-06-29 ENCOUNTER — Other Ambulatory Visit: Payer: Self-pay | Admitting: Internal Medicine

## 2021-06-29 DIAGNOSIS — E7800 Pure hypercholesterolemia, unspecified: Secondary | ICD-10-CM

## 2021-07-13 DIAGNOSIS — M5412 Radiculopathy, cervical region: Secondary | ICD-10-CM | POA: Diagnosis not present

## 2021-07-13 DIAGNOSIS — M9901 Segmental and somatic dysfunction of cervical region: Secondary | ICD-10-CM | POA: Diagnosis not present

## 2021-07-22 DIAGNOSIS — M9901 Segmental and somatic dysfunction of cervical region: Secondary | ICD-10-CM | POA: Diagnosis not present

## 2021-07-22 DIAGNOSIS — M5412 Radiculopathy, cervical region: Secondary | ICD-10-CM | POA: Diagnosis not present

## 2021-07-27 DIAGNOSIS — M9901 Segmental and somatic dysfunction of cervical region: Secondary | ICD-10-CM | POA: Diagnosis not present

## 2021-07-27 DIAGNOSIS — M5412 Radiculopathy, cervical region: Secondary | ICD-10-CM | POA: Diagnosis not present

## 2021-08-04 ENCOUNTER — Encounter: Payer: Medicare PPO | Attending: Physical Medicine and Rehabilitation | Admitting: Physical Medicine and Rehabilitation

## 2021-08-04 ENCOUNTER — Encounter: Payer: Self-pay | Admitting: Physical Medicine and Rehabilitation

## 2021-08-04 VITALS — BP 123/72 | HR 85 | Ht 65.0 in | Wt 169.4 lb

## 2021-08-04 DIAGNOSIS — M7918 Myalgia, other site: Secondary | ICD-10-CM | POA: Diagnosis not present

## 2021-08-04 DIAGNOSIS — M898X1 Other specified disorders of bone, shoulder: Secondary | ICD-10-CM | POA: Insufficient documentation

## 2021-08-04 NOTE — Progress Notes (Signed)
Subjective:    Patient ID: Holly Cochran, female    DOB: 09-01-1950, 71 y.o.   MRN: [id number]  HPI Holly Cochran is a 71 year old woman who presents to establish care for arthritis in clavicles and tight traps.   1) Tight trapezius muscles -went to a rolfer and it really helped -she knows of only two more rolfers and she just can't get into see them, she gets benefits for a least two weeks.  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 They did a lot of different exercises and she asks if she is cleared to return to work with them.  Pain Inventory Average Pain 2 Pain Right Now 2 My pain is sharp  In the last 24 hours, has pain interfered with the following? General activity 0 Relation with others 0 Enjoyment of life 0 What TIME of day is your pain at its worst? morning  and evening Sleep (in general) NA  Pain is worse with: bending, inactivity, and some activites Pain improves with: therapy/exercise and medication Relief from Meds:  na  how many minutes can you walk? 60 ability to climb steps?  yes do you drive?  yes  retired  trouble walking has nerve damage in left leg and foot sometimes doesn't cooperate  Any changes since last visit?  no  Primary care Billey Gosling MD    Family History  Problem Relation Age of Onset   Diabetes Mother    Kidney disease Mother    Hypertension Mother    Cancer Father        Pancreatic   Breast cancer Paternal Aunt        Age 72's   Cancer Maternal Uncle        melanoma   Diabetes Maternal Uncle    Colon cancer Neg Hx    Esophageal cancer Neg Hx    Stomach cancer Neg Hx    Rectal cancer Neg Hx    Social History   Socioeconomic History   Marital status: Single    Spouse name: Not on file   Number of children: Not on file   Years of education: Not on file   Highest education  level: Not on file  Occupational History   Not on file  Tobacco Use   Smoking status: Never   Smokeless tobacco: Never  Substance and Sexual Activity   Alcohol use: Yes    Alcohol/week: 1.0 standard drink of alcohol    Types: 1 Standard drinks or equivalent per week    Comment: occasionally   Drug use: No   Sexual activity: Never    Birth control/protection: Abstinence, Post-menopausal    Comment: Virgin  Other Topics Concern   Not on file  Social History Narrative   Not on file   Social Determinants of Health   Financial Resource Strain: Low Risk  (12/22/2020)   Overall Financial Resource Strain (CARDIA)    Difficulty of Paying Living Expenses: Not hard at all  Food Insecurity: No Food Insecurity (12/22/2020)   Hunger Vital Sign    Worried About Running Out of Food in the Last Year: Never true    Ran Out of Food in the Last Year: Never true  Transportation Needs: No Transportation Needs (12/22/2020)   PRAPARE - Transportation    Lack of Transportation (Medical): No    Lack  of Transportation (Non-Medical): No  Physical Activity: Sufficiently Active (12/22/2020)   Exercise Vital Sign    Days of Exercise per Week: 5 days    Minutes of Exercise per Session: 30 min  Stress: No Stress Concern Present (12/22/2020)   [place]    Feeling of Stress : Not at all  Social Connections: [place] (12/22/2020)   Social Connection and Isolation Panel [NHANES]    Frequency of Communication with Friends and Family: More than three times a week    Frequency of Social Gatherings with Friends and Family: Once a week    Attends Religious Services: More than 4 times per year    Active Member of Clubs or Organizations: Yes    Attends Archivist Meetings: More than 4 times per year    Marital Status: Married   Past Surgical History:  Procedure Laterality Date   COLONOSCOPY  2005    Dr Sharlett Iles,  negative   INTRAMEDULLARY (IM) NAIL INTERTROCHANTERIC Left 11/18/2017   Procedure: INTRAMEDULLARY (IM) NAIL INTERTROCHANTRIC;  Surgeon: Dorna Leitz, MD;  Location: [facility];  Service: Orthopedics;  Laterality: Left;   PLANTAR FASCIA SURGERY  08/2010   TONSILLECTOMY     Past Medical History:  Diagnosis Date   Common peroneal neuropathy of left lower extremity 05/20/2014   FASCIITIS, PLANTAR 10/14/2007   Qualifier: Diagnosis of  By: Linna Darner MD, Gwyndolyn Saxon     GERD (gastroesophageal reflux disease)    Hyperlipidemia    Osteopenia 2015   T score -1.1   Thyroid disease    hyperthyroid-RA I   BP 123/72   Pulse 85   Ht '5\' 5"'$  (1.651 m)   Wt 169 lb 6.4 oz (76.8 kg)   LMP 01/09/2005   SpO2 95%   BMI 28.19 kg/m   Opioid Risk Score:   Fall Risk Score:  `1  Depression screen [facility] 2/9     08/04/2021   10:12 AM 05/17/2021   10:47 AM 12/22/2020    8:49 AM 10/02/2019    1:22 PM 03/14/2019    2:54 PM 01/03/2017    8:08 AM 03/03/2016    8:34 AM  Depression screen PHQ 2/9  Decreased Interest 0 0 0 0 0 0 0  Down, Depressed, Hopeless 0 0 0 0 0 0 0  PHQ - 2 Score 0 0 0 0 0 0 0  Altered sleeping 0 0       Tired, decreased energy 0 1       Change in appetite 0 0       Feeling bad or failure about yourself  0 0       Trouble concentrating 0 0       Moving slowly or fidgety/restless 0 0       Suicidal thoughts 0 0       PHQ-9 Score 0 1       Difficult doing work/chores  Not difficult at all          Review of Systems  Constitutional: Negative.   HENT: Negative.    Eyes: Negative.   Respiratory: Negative.    Cardiovascular: Negative.   Gastrointestinal: Negative.   Endocrine: Negative.   Genitourinary: Negative.   Musculoskeletal:  Positive for gait problem.       Shoulder pain bilateral and trapezius spasms  Skin: Negative.   Allergic/Immunologic: Negative.   Hematological: Negative.   Psychiatric/Behavioral: Negative.    All other systems reviewed and are negative.  Objective:    Physical Exam Gen: no distress, normal appearing HEENT: oral mucosa pink and moist, NCAT Cardio: Reg rate Chest: normal effort, normal rate of breathing Abd: soft, non-distended Ext: no edema Psych: pleasant, normal affect Skin: intact Neuro: Alert and oriented x3 Musculoskeletal: Tight cervical myofascia       Assessment & Plan:   1) Cervical myofascial pain syndrome -plan for trigger point injections next weekend -encouraged continued massage -discussed benefits of massage/trigger points/dry needling/heat in improving blood flow -prescribed OT for myofascial release, postural correction, and stretching and strengthening of upper back muscles  2) Clavicle pain s/p history of bilateral elbow fractures -Provided with a pain relief journal and discussed that it contains foods and lifestyle tips to naturally help to improve pain. Discussed that these lifestyle strategies are also very good for health unlike some medications which can have negative side effects. Discussed that the act of keeping a journal can be therapeutic and helpful to realize patterns what helps to trigger and alleviate pain.   -discussed benefits of CBD oil application and where it can be purchased -discussed benefits of anti-inflammatory foods for pain

## 2021-08-10 DIAGNOSIS — M5412 Radiculopathy, cervical region: Secondary | ICD-10-CM | POA: Diagnosis not present

## 2021-08-10 DIAGNOSIS — M9901 Segmental and somatic dysfunction of cervical region: Secondary | ICD-10-CM | POA: Diagnosis not present

## 2021-08-18 ENCOUNTER — Encounter: Payer: Medicare PPO | Attending: Physical Medicine and Rehabilitation | Admitting: Physical Medicine and Rehabilitation

## 2021-08-18 ENCOUNTER — Encounter: Payer: Self-pay | Admitting: Physical Medicine and Rehabilitation

## 2021-08-18 VITALS — BP 119/89 | HR 79 | Ht 65.0 in | Wt 169.6 lb

## 2021-08-18 DIAGNOSIS — M7918 Myalgia, other site: Secondary | ICD-10-CM | POA: Diagnosis not present

## 2021-08-18 NOTE — Progress Notes (Signed)

## 2021-08-23 DIAGNOSIS — M5412 Radiculopathy, cervical region: Secondary | ICD-10-CM | POA: Diagnosis not present

## 2021-08-23 DIAGNOSIS — M9901 Segmental and somatic dysfunction of cervical region: Secondary | ICD-10-CM | POA: Diagnosis not present

## 2021-08-25 DIAGNOSIS — L720 Epidermal cyst: Secondary | ICD-10-CM | POA: Diagnosis not present

## 2021-08-25 DIAGNOSIS — L821 Other seborrheic keratosis: Secondary | ICD-10-CM | POA: Diagnosis not present

## 2021-09-09 ENCOUNTER — Other Ambulatory Visit: Payer: Self-pay | Admitting: Internal Medicine

## 2021-09-20 ENCOUNTER — Ambulatory Visit: Payer: Medicare PPO | Admitting: Physical Medicine and Rehabilitation

## 2021-09-27 ENCOUNTER — Ambulatory Visit: Payer: Medicare PPO | Admitting: Dermatology

## 2021-10-11 DIAGNOSIS — D229 Melanocytic nevi, unspecified: Secondary | ICD-10-CM | POA: Diagnosis not present

## 2021-10-11 DIAGNOSIS — L814 Other melanin hyperpigmentation: Secondary | ICD-10-CM | POA: Diagnosis not present

## 2021-10-11 DIAGNOSIS — L820 Inflamed seborrheic keratosis: Secondary | ICD-10-CM | POA: Diagnosis not present

## 2021-10-11 DIAGNOSIS — L821 Other seborrheic keratosis: Secondary | ICD-10-CM | POA: Diagnosis not present

## 2021-10-11 DIAGNOSIS — L578 Other skin changes due to chronic exposure to nonionizing radiation: Secondary | ICD-10-CM | POA: Diagnosis not present

## 2021-10-13 ENCOUNTER — Encounter: Payer: Self-pay | Admitting: Internal Medicine

## 2021-10-14 ENCOUNTER — Other Ambulatory Visit: Payer: Self-pay

## 2021-10-14 MED ORDER — SEMAGLUTIDE-WEIGHT MANAGEMENT 1 MG/0.5ML ~~LOC~~ SOAJ
1.0000 mg | SUBCUTANEOUS | 0 refills | Status: DC
Start: 2021-10-14 — End: 2022-01-24

## 2021-10-31 ENCOUNTER — Ambulatory Visit: Payer: Medicare PPO | Admitting: Physical Medicine and Rehabilitation

## 2022-01-22 ENCOUNTER — Encounter: Payer: Self-pay | Admitting: Internal Medicine

## 2022-01-22 NOTE — Progress Notes (Unsigned)
Subjective:    Patient ID: Holly Cochran, female    DOB: [birth year], 72 y.o.   MRN: [id number]      HPI Holly Cochran is here for a Physical exam.    She would like to decreasing her medications if possible.  Overall doing well without concerns   Medications and allergies reviewed with patient and updated if appropriate.  Current Outpatient Medications on File Prior to Visit  Medication Sig Dispense Refill   alendronate (FOSAMAX) 70 MG tablet TAKE 1 TABLET EVERY 7 DAYS. TAKE WITH A FULL GLASS OF WATER ON AN EMPTY STOMACH. 12 tablet 3   aspirin 81 MG EC tablet Take 1 tablet (81 mg total) by mouth daily. Swallow whole. 30 tablet 12   Cholecalciferol (VITAMIN D3) 25 MCG (1000 UT) CAPS Take 1,000 Units by mouth daily.     escitalopram (LEXAPRO) 20 MG tablet TAKE 1 TABLET BY MOUTH ONCE DAILY. 90 tablet 1   levothyroxine (SYNTHROID) 125 MCG tablet TAKE ONE TABLET ONCE DAILY AND TAKE 1/2 TABLET ON TUESDAY, THURSDAY AND SATURDAY 70 tablet 1   losartan (COZAAR) 25 MG tablet TAKE 1 TABLET BY MOUTH ONCE DAILY. 90 tablet 1   methocarbamol (ROBAXIN) 500 MG tablet TAKE 1 TABLET EVERY 6 HOURS AS NEEDED FOR MUSCLE SPASM. 180 tablet 3   omeprazole (PRILOSEC) 40 MG capsule TAKE ONE CAPSULE BY MOUTH ONCE DAILY 30 MINUTES BEFORE A MEAL 90 capsule 1   pravastatin (PRAVACHOL) 40 MG tablet TAKE ONE TABLET DAILY AT BEDTIME. 90 tablet 1   No current facility-administered medications on file prior to visit.    Review of Systems  Constitutional:  Negative for fever.  Eyes:  Negative for visual disturbance.  Respiratory:  Negative for cough, shortness of breath and wheezing.   Cardiovascular:  Negative for chest pain, palpitations and leg swelling.  Gastrointestinal:  Negative for abdominal pain, blood in stool, constipation, diarrhea and nausea.  Genitourinary:  Negative for dysuria.  Musculoskeletal:  Positive for arthralgias and neck pain. Negative for back pain.  Skin:  Negative for rash.   Neurological:  Negative for light-headedness and headaches.  Psychiatric/Behavioral:  Negative for dysphoric mood. The patient is not nervous/anxious.        Objective:   Vitals:   01/24/22 0958  BP: 114/72  Pulse: 82  Temp: 98 F (36.7 C)  SpO2: 97%   Filed Weights   01/24/22 0958  Weight: 168 lb (76.2 kg)   Body mass index is 27.96 kg/m.  BP Readings from Last 3 Encounters:  01/24/22 114/72  08/18/21 119/89  08/04/21 123/72    Wt Readings from Last 3 Encounters:  01/24/22 168 lb (76.2 kg)  08/18/21 169 lb 9.6 oz (76.9 kg)  08/04/21 169 lb 6.4 oz (76.8 kg)       Physical Exam Constitutional: She appears well-developed and well-nourished. No distress.  HENT:  Head: Normocephalic and atraumatic.  Right Ear: External ear normal. Normal ear canal and TM Left Ear: External ear normal.  Normal ear canal and TM Mouth/Throat: Oropharynx is clear and moist.  Eyes: Conjunctivae normal.  Neck: Neck supple. No tracheal deviation present. No thyromegaly present.  No carotid bruit  Cardiovascular: Normal rate, regular rhythm and normal heart sounds.   No murmur heard.  No edema. Pulmonary/Chest: Effort normal and breath sounds normal. No respiratory distress. She has no wheezes. She has no rales.  Breast: deferred   Abdominal: Soft. She exhibits no distension. There is no tenderness.  Lymphadenopathy: She has  no cervical adenopathy.  Skin: Skin is warm and dry. She is not diaphoretic.  Psychiatric: She has a normal mood and affect. Her behavior is normal.     Lab Results  Component Value Date   WBC 8.5 05/20/2020   HGB 14.8 05/20/2020   HCT 44.2 05/20/2020   PLT 342.0 05/20/2020   GLUCOSE 86 05/20/2020   CHOL 140 05/20/2020   TRIG 171.0 (H) 05/20/2020   HDL 40.30 05/20/2020   LDLCALC 66 05/20/2020   ALT 18 05/20/2020   AST 18 05/20/2020   NA 138 05/20/2020   K 5.1 05/20/2020   CL 102 05/20/2020   CREATININE 0.73 05/20/2020   BUN 17 05/20/2020   CO2 30  05/20/2020   TSH 2.00 05/20/2020   HGBA1C 5.7 05/20/2020         Assessment & Plan:   Physical exam: Screening blood work  ordered Exercise  walking 4-5 miles most days Weight good-working on weight loss Substance abuse  none   Reviewed recommended immunizations.   Health Maintenance  Topic Date Due   COVID-19 Vaccine (5 - 2023-24 season) 12/01/2021   Medicare Annual Wellness (AWV)  12/22/2021   DEXA SCAN  05/18/2022 (Originally 09/19/2020)   MAMMOGRAM  01/27/2023   COLONOSCOPY (Pts 45-53yr Insurance coverage will need to be confirmed)  07/07/2024   DTaP/Tdap/Td (2 - Td or Tdap) 01/30/2026   Pneumonia Vaccine 72 Years old  Completed   INFLUENZA VACCINE  Completed   Hepatitis C Screening  Completed   Zoster Vaccines- Shingrix  Completed   HPV VACCINES  Aged Out          See Problem List for Assessment and Plan of chronic medical problems.

## 2022-01-22 NOTE — Patient Instructions (Addendum)
Blood work was ordered.   The lab is on the first floor.    Medications changes include : None    Return in about 1 year (around 01/25/2023) for Physical Exam, Schedule DEXA-[place].   Health Maintenance, Female Adopting a healthy lifestyle and getting preventive care are important in promoting health and wellness. Ask your health care provider about: The right schedule for you to have regular tests and exams. Things you can do on your own to prevent diseases and keep yourself healthy. What should I know about diet, weight, and exercise? Eat a healthy diet  Eat a diet that includes plenty of vegetables, fruits, low-fat dairy products, and lean protein. Do not eat a lot of foods that are high in solid fats, added sugars, or sodium. Maintain a healthy weight Body mass index (BMI) is used to identify weight problems. It estimates body fat based on height and weight. Your health care provider can help determine your BMI and help you achieve or maintain a healthy weight. Get regular exercise Get regular exercise. This is one of the most important things you can do for your health. Most adults should: Exercise for at least 150 minutes each week. The exercise should increase your heart rate and make you sweat (moderate-intensity exercise). Do strengthening exercises at least twice a week. This is in addition to the moderate-intensity exercise. Spend less time sitting. Even light physical activity can be beneficial. Watch cholesterol and blood lipids Have your blood tested for lipids and cholesterol at 71 years of age, then have this test every 5 years. Have your cholesterol levels checked more often if: Your lipid or cholesterol levels are high. You are older than 71 years of age. You are at high risk for heart disease. What should I know about cancer screening? Depending on your health history and family history, you may need to have cancer screening at various ages. This may  include screening for: Breast cancer. Cervical cancer. Colorectal cancer. Skin cancer. Lung cancer. What should I know about heart disease, diabetes, and high blood pressure? Blood pressure and heart disease High blood pressure causes heart disease and increases the risk of stroke. This is more likely to develop in people who have high blood pressure readings or are overweight. Have your blood pressure checked: Every 3-5 years if you are 75-32 years of age. Every year if you are 52 years old or older. Diabetes Have regular diabetes screenings. This checks your fasting blood sugar level. Have the screening done: Once every three years after age 5 if you are at a normal weight and have a low risk for diabetes. More often and at a younger age if you are overweight or have a high risk for diabetes. What should I know about preventing infection? Hepatitis B If you have a higher risk for hepatitis B, you should be screened for this virus. Talk with your health care provider to find out if you are at risk for hepatitis B infection. Hepatitis C Testing is recommended for: Everyone born from [birth year] through 1964. Anyone with known risk factors for hepatitis C. Sexually transmitted infections (STIs) Get screened for STIs, including gonorrhea and chlamydia, if: You are sexually active and are younger than 71 years of age. You are older than 71 years of age and your health care provider tells you that you are at risk for this type of infection. Your sexual activity has changed since you were last screened, and you are at increased  risk for chlamydia or gonorrhea. Ask your health care provider if you are at risk. Ask your health care provider about whether you are at high risk for HIV. Your health care provider may recommend a prescription medicine to help prevent HIV infection. If you choose to take medicine to prevent HIV, you should first get tested for HIV. You should then be tested every 3 months  for as long as you are taking the medicine. Pregnancy If you are about to stop having your period (premenopausal) and you may become pregnant, seek counseling before you get pregnant. Take 400 to 800 micrograms (mcg) of folic acid every day if you become pregnant. Ask for birth control (contraception) if you want to prevent pregnancy. Osteoporosis and menopause Osteoporosis is a disease in which the bones lose minerals and strength with aging. This can result in bone fractures. If you are 8 years old or older, or if you are at risk for osteoporosis and fractures, ask your health care provider if you should: Be screened for bone loss. Take a calcium or vitamin D supplement to lower your risk of fractures. Be given hormone replacement therapy (HRT) to treat symptoms of menopause. Follow these instructions at home: Alcohol use Do not drink alcohol if: Your health care provider tells you not to drink. You are pregnant, may be pregnant, or are planning to become pregnant. If you drink alcohol: Limit how much you have to: 0-1 drink a day. Know how much alcohol is in your drink. In the [place] one drink equals one 12 oz bottle of beer (355 mL), one 5 oz glass of wine (148 mL), or one 1 oz glass of hard liquor (44 mL). Lifestyle Do not use any products that contain nicotine or tobacco. These products include cigarettes, chewing tobacco, and vaping devices, such as e-cigarettes. If you need help quitting, ask your health care provider. Do not use street drugs. Do not share needles. Ask your health care provider for help if you need support or information about quitting drugs. General instructions Schedule regular health, dental, and eye exams. Stay current with your vaccines. Tell your health care provider if: You often feel depressed. You have ever been abused or do not feel safe at home. Summary Adopting a healthy lifestyle and getting preventive care are important in promoting health and  wellness. Follow your health care provider's instructions about healthy diet, exercising, and getting tested or screened for diseases. Follow your health care provider's instructions on monitoring your cholesterol and blood pressure. This information is not intended to replace advice given to you by your health care provider. Make sure you discuss any questions you have with your health care provider. Document Revised: 05/17/2020 Document Reviewed: 05/17/2020 Elsevier Patient Education  2023 [employer].

## 2022-01-24 ENCOUNTER — Ambulatory Visit (INDEPENDENT_AMBULATORY_CARE_PROVIDER_SITE_OTHER): Payer: Medicare PPO | Admitting: Internal Medicine

## 2022-01-24 VITALS — BP 114/72 | HR 82 | Temp 98.0°F | Ht 65.0 in | Wt 168.0 lb

## 2022-01-24 DIAGNOSIS — I10 Essential (primary) hypertension: Secondary | ICD-10-CM | POA: Diagnosis not present

## 2022-01-24 DIAGNOSIS — Z0000 Encounter for general adult medical examination without abnormal findings: Secondary | ICD-10-CM | POA: Diagnosis not present

## 2022-01-24 DIAGNOSIS — K219 Gastro-esophageal reflux disease without esophagitis: Secondary | ICD-10-CM

## 2022-01-24 DIAGNOSIS — F419 Anxiety disorder, unspecified: Secondary | ICD-10-CM

## 2022-01-24 DIAGNOSIS — E7849 Other hyperlipidemia: Secondary | ICD-10-CM | POA: Diagnosis not present

## 2022-01-24 DIAGNOSIS — M549 Dorsalgia, unspecified: Secondary | ICD-10-CM

## 2022-01-24 DIAGNOSIS — E890 Postprocedural hypothyroidism: Secondary | ICD-10-CM

## 2022-01-24 DIAGNOSIS — M810 Age-related osteoporosis without current pathological fracture: Secondary | ICD-10-CM | POA: Diagnosis not present

## 2022-01-24 DIAGNOSIS — G8929 Other chronic pain: Secondary | ICD-10-CM

## 2022-01-24 DIAGNOSIS — E559 Vitamin D deficiency, unspecified: Secondary | ICD-10-CM

## 2022-01-24 DIAGNOSIS — F32A Depression, unspecified: Secondary | ICD-10-CM

## 2022-01-24 LAB — CBC WITH DIFFERENTIAL/PLATELET
Basophils Absolute: 0 10*3/uL (ref 0.0–0.1)
Basophils Relative: 0.8 % (ref 0.0–3.0)
Eosinophils Absolute: 0.1 10*3/uL (ref 0.0–0.7)
Eosinophils Relative: 2.7 % (ref 0.0–5.0)
HCT: 41.7 % (ref 36.0–46.0)
Hemoglobin: 14.1 g/dL (ref 12.0–15.0)
Lymphocytes Relative: 31.9 % (ref 12.0–46.0)
Lymphs Abs: 1.6 10*3/uL (ref 0.7–4.0)
MCHC: 33.8 g/dL (ref 30.0–36.0)
MCV: 93.4 fl (ref 78.0–100.0)
Monocytes Absolute: 0.5 10*3/uL (ref 0.1–1.0)
Monocytes Relative: 9.5 % (ref 3.0–12.0)
Neutro Abs: 2.8 10*3/uL (ref 1.4–7.7)
Neutrophils Relative %: 55.1 % (ref 43.0–77.0)
Platelets: 307 10*3/uL (ref 150.0–400.0)
RBC: 4.47 Mil/uL (ref 3.87–5.11)
RDW: 13 % (ref 11.5–15.5)
WBC: 5.1 10*3/uL (ref 4.0–10.5)

## 2022-01-24 LAB — COMPREHENSIVE METABOLIC PANEL
ALT: 19 U/L (ref 0–35)
AST: 22 U/L (ref 0–37)
Albumin: 4.4 g/dL (ref 3.5–5.2)
Alkaline Phosphatase: 49 U/L (ref 39–117)
BUN: 13 mg/dL (ref 6–23)
CO2: 31 mEq/L (ref 19–32)
Calcium: 9.4 mg/dL (ref 8.4–10.5)
Chloride: 102 mEq/L (ref 96–112)
Creatinine, Ser: 0.8 mg/dL (ref 0.40–1.20)
GFR: 74.74 mL/min (ref >60.00)
Glucose, Bld: 96 mg/dL (ref 70–99)
Potassium: 5.1 mEq/L (ref 3.5–5.1)
Sodium: 139 mEq/L (ref 135–145)
Total Bilirubin: 0.7 mg/dL (ref 0.2–1.2)
Total Protein: 7.1 g/dL (ref 6.0–8.3)

## 2022-01-24 LAB — LIPID PANEL
Cholesterol: 137 mg/dL (ref 0–200)
HDL: 31.3 mg/dL — ABNORMAL LOW (ref >39.00)
LDL Cholesterol: 72 mg/dL (ref 0–99)
NonHDL: 105.39
Total CHOL/HDL Ratio: 4
Triglycerides: 169 mg/dL — ABNORMAL HIGH (ref 0.0–149.0)
VLDL: 33.8 mg/dL (ref 0.0–40.0)

## 2022-01-24 LAB — VITAMIN D 25 HYDROXY (VIT D DEFICIENCY, FRACTURES): VITD: 32.58 ng/mL (ref 30.00–100.00)

## 2022-01-24 LAB — TSH: TSH: 2.13 u[IU]/mL (ref 0.35–5.50)

## 2022-01-24 MED ORDER — MELOXICAM 15 MG PO TABS: 15.0000 mg | ORAL_TABLET | Freq: Every day | ORAL | 1 refills | Status: DC | PRN

## 2022-01-24 NOTE — Assessment & Plan Note (Addendum)
Chronic Controlled, Stable ?  Still need medication She will try decreasing Lexapro to 10 mg daily-can either stay at that dose or try to decrease further depending on how she feels She will contact me with any concerns

## 2022-01-24 NOTE — Assessment & Plan Note (Signed)
Chronic Taking vitamin D daily Check vitamin D level  

## 2022-01-24 NOTE — Assessment & Plan Note (Signed)
Chronic  Clinically euthyroid Check tsh and will titrate med dose if needed Currently taking levothyroxine 125 mcg 4 days a week, 60.25 mcg 3 days a week

## 2022-01-24 NOTE — Assessment & Plan Note (Signed)
Chronic Bilateral Muscular in nature Improved significantly with deep massage/[place] Continue methocarbamol 500 mg daily-can take more frequently if needed Can take meloxicam 15 mg daily as needed

## 2022-01-24 NOTE — Assessment & Plan Note (Addendum)
Chronic Blood pressure well controlled-may not need medication and she would like to try to go off of it CMP She will try to decrease losartan to 12.5 mg daily, monitor blood pressure and then stop medication and see how she does without it Continue to monitor blood pressure periodically to make sure it is controlled

## 2022-01-24 NOTE — Assessment & Plan Note (Addendum)
Chronic GERD controlled Continue omeprazole 40 mg QOD-discussed trying to slowly taper off medication for possible-can take as needed if needed

## 2022-01-24 NOTE — Assessment & Plan Note (Addendum)
Chronic DEXA due-ordered Continue Fosamax for 5 years-started 2021 Continue calcium and vitamin D supplementation Continue regular exercise Check vitamin D level

## 2022-01-24 NOTE — Assessment & Plan Note (Signed)
Chronic Regular exercise and healthy diet encouraged Check lipid panel  Continue pravastatin 40 mg daily

## 2022-01-26 ENCOUNTER — Ambulatory Visit (INDEPENDENT_AMBULATORY_CARE_PROVIDER_SITE_OTHER)
Admission: RE | Admit: 2022-01-26 | Discharge: 2022-01-26 | Disposition: A | Discharge status: Home / Self Care | Payer: Medicare PPO | Source: Ambulatory Visit | Attending: Internal Medicine | Admitting: Internal Medicine

## 2022-01-26 DIAGNOSIS — H40042 Steroid responder, left eye: Secondary | ICD-10-CM | POA: Diagnosis not present

## 2022-01-26 DIAGNOSIS — M810 Age-related osteoporosis without current pathological fracture: Secondary | ICD-10-CM | POA: Diagnosis not present

## 2022-01-26 DIAGNOSIS — H5201 Hypermetropia, right eye: Secondary | ICD-10-CM | POA: Diagnosis not present

## 2022-01-26 DIAGNOSIS — H04123 Dry eye syndrome of bilateral lacrimal glands: Secondary | ICD-10-CM | POA: Diagnosis not present

## 2022-01-26 DIAGNOSIS — H25813 Combined forms of age-related cataract, bilateral: Secondary | ICD-10-CM | POA: Diagnosis not present

## 2022-01-26 DIAGNOSIS — H524 Presbyopia: Secondary | ICD-10-CM | POA: Diagnosis not present

## 2022-01-26 DIAGNOSIS — H52223 Regular astigmatism, bilateral: Secondary | ICD-10-CM | POA: Diagnosis not present

## 2022-01-26 DIAGNOSIS — H40012 Open angle with borderline findings, low risk, left eye: Secondary | ICD-10-CM | POA: Diagnosis not present

## 2022-01-26 DIAGNOSIS — H53143 Visual discomfort, bilateral: Secondary | ICD-10-CM | POA: Diagnosis not present

## 2022-01-27 DIAGNOSIS — Z1231 Encounter for screening mammogram for malignant neoplasm of breast: Secondary | ICD-10-CM | POA: Diagnosis not present

## 2022-01-27 LAB — HM MAMMOGRAPHY

## 2022-02-02 ENCOUNTER — Encounter: Payer: Self-pay | Admitting: Internal Medicine

## 2022-02-02 ENCOUNTER — Other Ambulatory Visit: Payer: Self-pay | Admitting: Internal Medicine

## 2022-02-02 NOTE — Progress Notes (Signed)
Outside notes received. Information abstracted. Notes sent to scan. 

## 2022-02-10 NOTE — Progress Notes (Unsigned)
    Benito Mccreedy D.[place] Phone: [phone]   Assessment and Plan:     There are no diagnoses linked to this encounter.  ***   Pertinent previous records reviewed include ***   Follow Up: ***     Subjective:   I, Holly Cochran, am serving as a Education administrator for Doctor Glennon Mac  Chief Complaint: back pain   HPI:   02/13/2022 Patient is a 72 year old female complaining of back pain. Patient states    Relevant Historical Information: ***  Additional pertinent review of systems negative.   Current Outpatient Medications:    alendronate (FOSAMAX) 70 MG tablet, TAKE ONE TABLET BY MOUTH EVERY 7 DAYS WITH A FULL GLASS OF WATER ON AN EMPTY STOMACH, Disp: 12 tablet, Rfl: 3   aspirin 81 MG EC tablet, Take 1 tablet (81 mg total) by mouth daily. Swallow whole., Disp: 30 tablet, Rfl: 12   Cholecalciferol (VITAMIN D3) 25 MCG (1000 UT) CAPS, Take 1,000 Units by mouth daily., Disp: , Rfl:    escitalopram (LEXAPRO) 20 MG tablet, TAKE 1 TABLET BY MOUTH ONCE DAILY., Disp: 90 tablet, Rfl: 1   levothyroxine (SYNTHROID) 125 MCG tablet, TAKE ONE TABLET ONCE DAILY AND TAKE 1/2 TABLET ON TUESDAY, THURSDAY AND SATURDAY, Disp: 70 tablet, Rfl: 1   losartan (COZAAR) 25 MG tablet, TAKE 1 TABLET BY MOUTH ONCE DAILY., Disp: 90 tablet, Rfl: 1   meloxicam (MOBIC) 15 MG tablet, Take 1 tablet (15 mg total) by mouth daily as needed for pain., Disp: 90 tablet, Rfl: 1   methocarbamol (ROBAXIN) 500 MG tablet, TAKE 1 TABLET EVERY 6 HOURS AS NEEDED FOR MUSCLE SPASM., Disp: 180 tablet, Rfl: 3   omeprazole (PRILOSEC) 40 MG capsule, TAKE ONE CAPSULE BY MOUTH ONCE DAILY 30 MINUTES BEFORE A MEAL, Disp: 90 capsule, Rfl: 1   pravastatin (PRAVACHOL) 40 MG tablet, TAKE ONE TABLET DAILY AT BEDTIME., Disp: 90 tablet, Rfl: 1   Objective:     There were no vitals filed for this visit.    There is no height or weight on file to calculate BMI.     Physical Exam:    ***   Electronically signed by:  Benito Mccreedy D.[facility] 11:44 AM 02/10/22

## 2022-02-13 ENCOUNTER — Ambulatory Visit (INDEPENDENT_AMBULATORY_CARE_PROVIDER_SITE_OTHER)
Admission: RE | Admit: 2022-02-13 | Discharge: 2022-02-13 | Disposition: A | Discharge status: Home / Self Care | Payer: Medicare PPO | Source: Ambulatory Visit | Attending: Sports Medicine | Admitting: Sports Medicine

## 2022-02-13 ENCOUNTER — Ambulatory Visit: Payer: Medicare PPO | Admitting: Sports Medicine

## 2022-02-13 VITALS — BP 120/80 | HR 81 | Ht 65.0 in | Wt 169.0 lb

## 2022-02-13 DIAGNOSIS — M7061 Trochanteric bursitis, right hip: Secondary | ICD-10-CM | POA: Diagnosis not present

## 2022-02-13 DIAGNOSIS — G8929 Other chronic pain: Secondary | ICD-10-CM | POA: Diagnosis not present

## 2022-02-13 DIAGNOSIS — M25551 Pain in right hip: Secondary | ICD-10-CM | POA: Diagnosis not present

## 2022-02-13 DIAGNOSIS — M542 Cervicalgia: Secondary | ICD-10-CM

## 2022-02-13 DIAGNOSIS — M546 Pain in thoracic spine: Secondary | ICD-10-CM | POA: Diagnosis not present

## 2022-02-13 DIAGNOSIS — M4312 Spondylolisthesis, cervical region: Secondary | ICD-10-CM | POA: Diagnosis not present

## 2022-02-13 DIAGNOSIS — M545 Low back pain, unspecified: Secondary | ICD-10-CM

## 2022-02-13 DIAGNOSIS — M4602 Spinal enthesopathy, cervical region: Secondary | ICD-10-CM | POA: Diagnosis not present

## 2022-02-13 DIAGNOSIS — M5136 Other intervertebral disc degeneration, lumbar region: Secondary | ICD-10-CM | POA: Diagnosis not present

## 2022-02-13 DIAGNOSIS — M25511 Pain in right shoulder: Secondary | ICD-10-CM | POA: Diagnosis not present

## 2022-02-13 DIAGNOSIS — M47812 Spondylosis without myelopathy or radiculopathy, cervical region: Secondary | ICD-10-CM | POA: Diagnosis not present

## 2022-02-13 NOTE — Patient Instructions (Addendum)
Good to see you  Xrays at [place] today [place] meloxicam 15 mg daily x2 weeks.  If still having pain after 2 weeks, complete 3rd-week of meloxicam. May use remaining meloxicam as needed once daily for pain control.  Do not to use additional NSAIDs while taking meloxicam.  May use Tylenol [phone] mg 2 to 3 times a day for breakthrough pain. Thoracic HEP  3 week follow up

## 2022-02-27 NOTE — Progress Notes (Unsigned)
    Benito Mccreedy D.[place] Phone: [phone]   Assessment and Plan:     There are no diagnoses linked to this encounter.  ***   Pertinent previous records reviewed include ***   Follow Up: ***     Subjective:   I, Kura Bethards, am serving as a Education administrator for Doctor Glennon Mac   Chief Complaint: back pain    HPI:    02/13/2022 Patient is a 72 year old female complaining of back pain. Patient states that she has had thoracic back pain for a long time , had an injury as a little kid , also went to a chiro and he HVLA 12 years or plus ago, she is walking 4-5 miles a day and she just has pain , if she takes an anti inflammatory that really helps , she takes a muscle relaxer every night , has a pain in her neck as well, she goes to [facility] who told her there was something in her thoracic that is not right , she would like an MRI, she wants right hip, neck and back xrays    02/28/2022 Patient states   Relevant Historical Information:  Hypertension, GERD  Additional pertinent review of systems negative.   Current Outpatient Medications:    alendronate (FOSAMAX) 70 MG tablet, TAKE ONE TABLET BY MOUTH EVERY 7 DAYS WITH A FULL GLASS OF WATER ON AN EMPTY STOMACH, Disp: 12 tablet, Rfl: 3   aspirin 81 MG EC tablet, Take 1 tablet (81 mg total) by mouth daily. Swallow whole., Disp: 30 tablet, Rfl: 12   Cholecalciferol (VITAMIN D3) 25 MCG (1000 UT) CAPS, Take 1,000 Units by mouth daily., Disp: , Rfl:    escitalopram (LEXAPRO) 20 MG tablet, TAKE 1 TABLET BY MOUTH ONCE DAILY., Disp: 90 tablet, Rfl: 1   levothyroxine (SYNTHROID) 125 MCG tablet, TAKE ONE TABLET ONCE DAILY AND TAKE 1/2 TABLET ON TUESDAY, THURSDAY AND SATURDAY, Disp: 70 tablet, Rfl: 1   losartan (COZAAR) 25 MG tablet, TAKE 1 TABLET BY MOUTH ONCE DAILY., Disp: 90 tablet, Rfl: 1   meloxicam (MOBIC) 15 MG tablet, Take 1 tablet (15 mg total) by mouth daily  as needed for pain., Disp: 90 tablet, Rfl: 1   methocarbamol (ROBAXIN) 500 MG tablet, TAKE 1 TABLET EVERY 6 HOURS AS NEEDED FOR MUSCLE SPASM., Disp: 180 tablet, Rfl: 3   omeprazole (PRILOSEC) 40 MG capsule, TAKE ONE CAPSULE BY MOUTH ONCE DAILY 30 MINUTES BEFORE A MEAL, Disp: 90 capsule, Rfl: 1   pravastatin (PRAVACHOL) 40 MG tablet, TAKE ONE TABLET DAILY AT BEDTIME., Disp: 90 tablet, Rfl: 1   Objective:     There were no vitals filed for this visit.    There is no height or weight on file to calculate BMI.    Physical Exam:    ***   Electronically signed by:  Benito Mccreedy D.[facility] 3:14 PM 02/27/22

## 2022-02-28 ENCOUNTER — Ambulatory Visit: Payer: Medicare PPO | Admitting: Sports Medicine

## 2022-02-28 VITALS — BP 110/80 | HR 87 | Ht 65.0 in | Wt 165.0 lb

## 2022-02-28 DIAGNOSIS — M545 Low back pain, unspecified: Secondary | ICD-10-CM

## 2022-02-28 DIAGNOSIS — M7061 Trochanteric bursitis, right hip: Secondary | ICD-10-CM | POA: Diagnosis not present

## 2022-02-28 DIAGNOSIS — M546 Pain in thoracic spine: Secondary | ICD-10-CM | POA: Diagnosis not present

## 2022-02-28 DIAGNOSIS — G8929 Other chronic pain: Secondary | ICD-10-CM | POA: Diagnosis not present

## 2022-02-28 DIAGNOSIS — M542 Cervicalgia: Secondary | ICD-10-CM | POA: Diagnosis not present

## 2022-02-28 NOTE — Patient Instructions (Addendum)
Good to see you Discontinue meloxicam use remainder as needed Tylenol [phone] mg 2-3 times a day for pain relief  Tumeric daily  As needed follow up

## 2022-03-02 ENCOUNTER — Encounter: Payer: Self-pay | Admitting: Internal Medicine

## 2022-03-02 MED ORDER — HYDROCODONE BIT-HOMATROP MBR 5-1.5 MG/5ML PO SOLN: 5.0000 mL | Freq: Three times a day (TID) | ORAL | 0 refills | Status: DC | PRN

## 2022-03-11 ENCOUNTER — Other Ambulatory Visit: Payer: Self-pay | Admitting: Internal Medicine

## 2022-03-20 ENCOUNTER — Other Ambulatory Visit: Payer: Self-pay | Admitting: Internal Medicine

## 2022-03-20 DIAGNOSIS — E890 Postprocedural hypothyroidism: Secondary | ICD-10-CM

## 2022-03-20 DIAGNOSIS — E7800 Pure hypercholesterolemia, unspecified: Secondary | ICD-10-CM

## 2022-03-21 ENCOUNTER — Telehealth: Payer: Self-pay

## 2022-03-21 NOTE — Telephone Encounter (Signed)
Contacted [name] to schedule their annual wellness visit. Appointment made for 03/29/22.  Norton Blizzard, [place] ([facility])  [place] [facility] [phone]

## 2022-03-29 ENCOUNTER — Ambulatory Visit (INDEPENDENT_AMBULATORY_CARE_PROVIDER_SITE_OTHER): Payer: Medicare PPO

## 2022-03-29 VITALS — Ht 65.0 in | Wt 162.0 lb

## 2022-03-29 DIAGNOSIS — Z0000 Encounter for general adult medical examination without abnormal findings: Secondary | ICD-10-CM | POA: Diagnosis not present

## 2022-03-29 NOTE — Progress Notes (Signed)
I connected with  Holly Cochran on 03/29/22 by a audio enabled telemedicine application and verified that I am speaking with the correct person using two identifiers.  Patient Location: Home  Provider Location: Home Office  I discussed the limitations of evaluation and management by telemedicine. The patient expressed understanding and agreed to proceed.  Subjective:   Holly Cochran is a 72 y.o. female who presents for Medicare Annual (Subsequent) preventive examination.  Review of Systems     Cardiac Risk Factors include: advanced age (>17men, >50 women);dyslipidemia;hypertension;family history of premature cardiovascular disease     Objective:    Today's Vitals   03/29/22 0850  Weight: 162 lb (73.5 kg)  Height: 5\' 5"  (1.651 m)  PainSc: 0-No pain   Body mass index is 26.96 kg/m.     03/29/2022    8:52 AM 02/22/2021   12:22 PM 12/22/2020    8:44 AM 10/02/2019    1:19 PM 11/18/2017    7:00 PM 11/18/2017    8:46 AM 11/18/2017    4:39 AM  Advanced Directives  Does Patient Have a Medical Advance Directive? Yes Yes Yes Yes Yes Yes No  Type of Paramedic of [place];Living will [place];Living will Living will;Healthcare Power of Attorney Living will;Healthcare Power of Attorney Living will;Healthcare Power of Attorney    Does patient want to make changes to medical advance directive?  No - Patient declined No - Patient declined No - Patient declined No - Patient declined    Copy of [place] in Chart? No - copy requested Yes - validated most recent copy scanned in chart (See row information) No - copy requested No - copy requested No - copy requested    Would patient like information on creating a medical advance directive?       No - Patient declined    Current Medications (verified) Outpatient Encounter Medications as of 03/29/2022  Medication Sig   alendronate (FOSAMAX) 70 MG tablet TAKE ONE TABLET BY  MOUTH EVERY 7 DAYS WITH A FULL GLASS OF WATER ON AN EMPTY STOMACH   aspirin 81 MG EC tablet Take 1 tablet (81 mg total) by mouth daily. Swallow whole.   Cholecalciferol (VITAMIN D3) 25 MCG (1000 UT) CAPS Take 1,000 Units by mouth daily.   escitalopram (LEXAPRO) 20 MG tablet TAKE 1 TABLET BY MOUTH ONCE DAILY.   HYDROcodone bit-homatropine (HYCODAN) 5-1.5 MG/5ML syrup Take 5 mLs by mouth every 8 (eight) hours as needed for cough.   levothyroxine (SYNTHROID) 125 MCG tablet TAKE ONE TABLET ONCE DAILY AND TAKE 1/2 TABLET ON TUESDAY, THURSDAY AND SATURDAY   losartan (COZAAR) 25 MG tablet TAKE 1 TABLET BY MOUTH ONCE DAILY.   meloxicam (MOBIC) 15 MG tablet Take 1 tablet (15 mg total) by mouth daily as needed for pain.   methocarbamol (ROBAXIN) 500 MG tablet TAKE 1 TABLET EVERY 6 HOURS AS NEEDED FOR MUSCLE SPASM.   omeprazole (PRILOSEC) 40 MG capsule TAKE ONE CAPSULE BY MOUTH ONCE DAILY 30 MINUTES BEFORE A MEAL   pravastatin (PRAVACHOL) 40 MG tablet TAKE ONE TABLET DAILY AT BEDTIME.   No facility-administered encounter medications on file as of 03/29/2022.    Allergies (verified) Sulfasalazine   History: Past Medical History:  Diagnosis Date   Common peroneal neuropathy of left lower extremity 05/20/2014   FASCIITIS, PLANTAR 10/14/2007   Qualifier: Diagnosis of  By: Linna Darner MD, Gwyndolyn Saxon     GERD (gastroesophageal reflux disease)    Hyperlipidemia  Osteopenia 2015   T score -1.1   Thyroid disease    hyperthyroid-RA I   Past Surgical History:  Procedure Laterality Date   COLONOSCOPY  2005    Dr Sharlett Iles, negative   INTRAMEDULLARY (IM) NAIL INTERTROCHANTERIC Left 11/18/2017   Procedure: INTRAMEDULLARY (IM) NAIL INTERTROCHANTRIC;  Surgeon: Dorna Leitz, MD;  Location: [facility];  Service: Orthopedics;  Laterality: Left;   PLANTAR FASCIA SURGERY  08/2010   TONSILLECTOMY     Family History  Problem Relation Age of Onset   Diabetes Mother    Kidney disease Mother    Hypertension Mother     Cancer Father        Pancreatic   Breast cancer Paternal Aunt        Age 75's   Cancer Maternal Uncle        melanoma   Diabetes Maternal Uncle    Colon cancer Neg Hx    Esophageal cancer Neg Hx    Stomach cancer Neg Hx    Rectal cancer Neg Hx    Social History   Socioeconomic History   Marital status: Single    Spouse name: Not on file   Number of children: Not on file   Years of education: Not on file   Highest education level: Not on file  Occupational History   Not on file  Tobacco Use   Smoking status: Never   Smokeless tobacco: Never  Substance and Sexual Activity   Alcohol use: Yes    Alcohol/week: 1.0 standard drink of alcohol    Types: 1 Standard drinks or equivalent per week    Comment: occasionally   Drug use: No   Sexual activity: Never    Birth control/protection: Abstinence, Post-menopausal    Comment: [place]  Other Topics Concern   Not on file  Social History Narrative   Not on file   Social Determinants of Health   Financial Resource Strain: Low Risk  (03/29/2022)   Overall Financial Resource Strain (CARDIA)    Difficulty of Paying Living Expenses: Not hard at all  Food Insecurity: No Food Insecurity (03/29/2022)   Hunger Vital Sign    Worried About Running Out of Food in the Last Year: Never true    Ran Out of Food in the Last Year: Never true  Transportation Needs: No Transportation Needs (03/29/2022)   PRAPARE - Hydrologist (Medical): No    Lack of Transportation (Non-Medical): No  Physical Activity: Sufficiently Active (03/29/2022)   Exercise Vital Sign    Days of Exercise per Week: 7 days    Minutes of Exercise per Session: 30 min  Stress: No Stress Concern Present (03/29/2022)   [place]    Feeling of Stress : Not at all  Social Connections: [place] (03/29/2022)   Social Connection and Isolation Panel [NHANES]    Frequency of  Communication with Friends and Family: More than three times a week    Frequency of Social Gatherings with Friends and Family: Once a week    Attends Religious Services: More than 4 times per year    Active Member of [employer] or Organizations: Yes    Attends Music therapist: More than 4 times per year    Marital Status: Married    Tobacco Counseling Counseling given: Not Answered   Clinical Intake:  Pre-visit preparation completed: Yes  Pain : No/denies pain Pain Score: 0-No pain     BMI -  recorded: 26.96 Nutritional Status: BMI 25 -29 Overweight Nutritional Risks: None Diabetes: No  How often do you need to have someone help you when you read instructions, pamphlets, or other written materials from your doctor or pharmacy?: 1 - Never What is the last grade level you completed in school?: HSG  Diabetic? No  Interpreter Needed?: No  Information entered by :: Lisette Abu, LPN.   Activities of Daily Living    03/29/2022    8:54 AM  In your present state of health, do you have any difficulty performing the following activities:  Hearing? 0  Vision? 0  Difficulty concentrating or making decisions? 0  Walking or climbing stairs? 0  Dressing or bathing? 0  Doing errands, shopping? 0  Preparing Food and eating ? N  Using the Toilet? N  In the past six months, have you accidently leaked urine? N  Do you have problems with loss of bowel control? N  Managing your Medications? N  Managing your Finances? N  Housekeeping or managing your Housekeeping? N    Patient Care Team: Binnie Rail, MD as PCP - General (Internal Medicine) Berle Mull, MD as Consulting Physician (Family Medicine) Lavonna Monarch, MD (Inactive) as Consulting Physician (Dermatology) Marica Otter, OD (Optometry)  Indicate any recent Medical Services you may have received from other than [facility] providers in the past year (date may be approximate).     Assessment:   This is a  routine wellness examination for [place].  Hearing/Vision screen Hearing Screening - Comments:: Denies hearing difficulties   Vision Screening - Comments:: Wears rx glasses - up to date with routine eye exams with Marica Otter, OD.   Dietary issues and exercise activities discussed: Current Exercise Habits: Home exercise routine, Type of exercise: walking, Time (Minutes): 30, Frequency (Times/Week): 7, Weekly Exercise (Minutes/Week): 210, Intensity: Moderate, Exercise limited by: None identified   Goals Addressed             This Visit's Progress    My healthcare goal for 2024 is to maintain my current health status by continuing to eat healthy, stay independent, physically and socially active.        Depression Screen    03/29/2022    8:54 AM 01/24/2022   10:06 AM 08/18/2021   11:54 AM 08/04/2021   10:12 AM 05/17/2021   10:47 AM 12/22/2020    8:49 AM 10/02/2019    1:22 PM  PHQ 2/9 Scores  PHQ - 2 Score 0 0 0 0 0 0 0  PHQ- 9 Score  0  0 1      Fall Risk    03/29/2022    8:53 AM 01/24/2022   10:06 AM 08/18/2021   11:54 AM 08/04/2021   10:12 AM 05/17/2021   10:47 AM  Fall Risk   Falls in the past year? 0 0 0 0 1  Number falls in past yr: 0 0 0  0  Injury with Fall? 0 0 0  0  Risk for fall due to : No Fall Risks No Fall Risks   No Fall Risks  Follow up Falls prevention discussed Falls evaluation completed   Falls evaluation completed    FALL RISK PREVENTION PERTAINING TO THE HOME:  Any stairs in or around the home? Yes  If so, are there any without handrails? No  Home free of loose throw rugs in walkways, pet beds, electrical cords, etc? Yes  Adequate lighting in your home to reduce risk of falls? Yes  ASSISTIVE DEVICES UTILIZED TO PREVENT FALLS:  Life alert? No  Use of a cane, walker or w/c? No  Grab bars in the bathroom? Yes  Shower chair or bench in shower? Yes  Elevated toilet seat or a handicapped toilet? Yes   TIMED UP AND GO:  Was the test performed? No .  Telephonic Visit  Cognitive Function:        03/29/2022    8:55 AM  6CIT Screen  What Year? 0 points  What month? 0 points  What time? 0 points  Count back from 20 0 points  Months in reverse 0 points  Repeat phrase 0 points  Total Score 0 points    Immunizations Immunization History  Administered Date(s) Administered   COVID-19, mRNA, vaccine(Comirnaty)12 years and older 10/06/2021   Fluad Quad(high Dose 65+) 10/10/2018, 11/05/2018, 12/05/2021   Influenza, High Dose Seasonal PF 11/10/2016, 09/20/2020   Influenza, Seasonal, Injecte, Preservative Fre 11/03/2013   Influenza,inj,Quad PF,6+ Mos 11/19/2015   Influenza,inj,quad, With Preservative 12/02/2014   Influenza-Unspecified 09/09/2012, 12/02/2014, 10/10/2015   PFIZER(Purple Top)SARS-COV-2 Vaccination 01/30/2019, 02/20/2019   Pfizer Covid-19 Vaccine Bivalent Booster 18yrs & up 09/20/2020   Pneumococcal Conjugate-13 02/05/2017   Pneumococcal Polysaccharide-23 04/14/2019   Respiratory Syncytial Virus Vaccine,Recomb Aduvanted(Arexvy) 12/17/2021   Tdap 01/31/2016   Zoster Recombinat (Shingrix) 07/20/2020, 11/24/2020   Zoster, Live 12/09/2014    TDAP status: Up to date  Flu Vaccine status: Up to date  Pneumococcal vaccine status: Up to date  Covid-19 vaccine status: Completed vaccines  Qualifies for Shingles Vaccine? Yes   Zostavax completed Yes   Shingrix Completed?: Yes  Screening Tests Health Maintenance  Topic Date Due   COVID-19 Vaccine (5 - 2023-24 season) 12/01/2021   Medicare Annual Wellness (AWV)  03/29/2023   DEXA SCAN  01/27/2024   MAMMOGRAM  01/28/2024   COLONOSCOPY (Pts 45-70yrs Insurance coverage will need to be confirmed)  07/07/2024   DTaP/Tdap/Td (2 - Td or Tdap) 01/30/2026   Pneumonia Vaccine 76+ Years old  Completed   INFLUENZA VACCINE  Completed   Hepatitis C Screening  Completed   Zoster Vaccines- Shingrix  Completed   HPV VACCINES  Aged Out    Health Maintenance  Health Maintenance  Due  Topic Date Due   COVID-19 Vaccine (5 - 2023-24 season) 12/01/2021    Colorectal cancer screening: Type of screening: Colonoscopy. Completed 07/08/2014. Repeat every 10 years  Mammogram status: Completed 01/27/2022. Repeat every year  Bone Density status: Completed 01/26/2022. Results reflect: Bone density results: OSTEOPENIA. Repeat every 2 years.  Lung Cancer Screening: (Low Dose CT Chest recommended if Age 85-80 years, 30 pack-year currently smoking OR have quit w/in 15years.) does not qualify.   Lung Cancer Screening Referral: no  Additional Screening:  Hepatitis C Screening: does qualify; Completed 01/31/2016  Vision Screening: Recommended annual ophthalmology exams for early detection of glaucoma and other disorders of the eye. Is the patient up to date with their annual eye exam?  Yes  Who is the provider or what is the name of the office in which the patient attends annual eye exams? Marica Otter, OD. If pt is not established with a provider, would they like to be referred to a provider to establish care? No .   Dental Screening: Recommended annual dental exams for proper oral hygiene  Community Resource Referral / Chronic Care Management: CRR required this visit?  No   CCM required this visit?  No      Plan:     I have  personally reviewed and noted the following in the patient's chart:   Medical and social history Use of alcohol, tobacco or illicit drugs  Current medications and supplements including opioid prescriptions. Patient is not currently taking opioid prescriptions. Functional ability and status Nutritional status Physical activity Advanced directives List of other physicians Hospitalizations, surgeries, and ER visits in previous 12 months Vitals Screenings to include cognitive, depression, and falls Referrals and appointments  In addition, I have reviewed and discussed with patient certain preventive protocols, quality metrics, and best practice  recommendations. A written personalized care plan for preventive services as well as general preventive health recommendations were provided to patient.     Sheral Flow, LPN   [id number]   Nurse Notes: Normal cognitive status assessed by direct observation by this Nurse Health Advisor. No abnormalities found.

## 2022-03-29 NOTE — Patient Instructions (Signed)
Holly Cochran , Thank you for taking time to come for your Medicare Wellness Visit. I appreciate your ongoing commitment to your health goals. Please review the following plan we discussed and let me know if I can assist you in the future.   These are the goals we discussed:  Goals      My healthcare goal for 2024 is to maintain my current health status by continuing to eat healthy, stay independent, physically and socially active.        This is a list of the screening recommended for you and due dates:  Health Maintenance  Topic Date Due   COVID-19 Vaccine (5 - 2023-24 season) 12/01/2021   Medicare Annual Wellness Visit  03/29/2023   DEXA scan (bone density measurement)  01/27/2024   Mammogram  01/28/2024   Colon Cancer Screening  07/07/2024   DTaP/Tdap/Td vaccine (2 - Td or Tdap) 01/30/2026   Pneumonia Vaccine  Completed   Flu Shot  Completed   Hepatitis C Screening: USPSTF Recommendation to screen - Ages 18-79 yo.  Completed   Zoster (Shingles) Vaccine  Completed   HPV Vaccine  Aged Out    Advanced directives: Yes; Please bring a copy of your health care power of attorney and living will to the office at your convenience.  Conditions/risks identified: Yes; Continue brisk walking.  Next appointment: Follow up in one year for your annual wellness visit.   Preventive Care 23 Years and Older, Female Preventive care refers to lifestyle choices and visits with your health care provider that can promote health and wellness. What does preventive care include? A yearly physical exam. This is also called an annual well check. Dental exams once or twice a year. Routine eye exams. Ask your health care provider how often you should have your eyes checked. Personal lifestyle choices, including: Daily care of your teeth and gums. Regular physical activity. Eating a healthy diet. Avoiding tobacco and drug use. Limiting alcohol use. Practicing safe sex. Taking low-dose aspirin every  day. Taking vitamin and mineral supplements as recommended by your health care provider. What happens during an annual well check? The services and screenings done by your health care provider during your annual well check will depend on your age, overall health, lifestyle risk factors, and family history of disease. Counseling  Your health care provider may ask you questions about your: Alcohol use. Tobacco use. Drug use. Emotional well-being. Home and relationship well-being. Sexual activity. Eating habits. History of falls. Memory and ability to understand (cognition). Work and work Statistician. Reproductive health. Screening  You may have the following tests or measurements: Height, weight, and BMI. Blood pressure. Lipid and cholesterol levels. These may be checked every 5 years, or more frequently if you are over 29 years old. Skin check. Lung cancer screening. You may have this screening every year starting at age 39 if you have a 30-pack-year history of smoking and currently smoke or have quit within the past 15 years. Fecal occult blood test (FOBT) of the stool. You may have this test every year starting at age 34. Flexible sigmoidoscopy or colonoscopy. You may have a sigmoidoscopy every 5 years or a colonoscopy every 10 years starting at age 84. Hepatitis C blood test. Hepatitis B blood test. Sexually transmitted disease (STD) testing. Diabetes screening. This is done by checking your blood sugar (glucose) after you have not eaten for a while (fasting). You may have this done every 1-3 years. Bone density scan. This is done to screen  for osteoporosis. You may have this done starting at age 52. Mammogram. This may be done every 1-2 years. Talk to your health care provider about how often you should have regular mammograms. Talk with your health care provider about your test results, treatment options, and if necessary, the need for more tests. Vaccines  Your health care  provider may recommend certain vaccines, such as: Influenza vaccine. This is recommended every year. Tetanus, diphtheria, and acellular pertussis (Tdap, Td) vaccine. You may need a Td booster every 10 years. Zoster vaccine. You may need this after age 77. Pneumococcal 13-valent conjugate (PCV13) vaccine. One dose is recommended after age 38. Pneumococcal polysaccharide (PPSV23) vaccine. One dose is recommended after age 28. Talk to your health care provider about which screenings and vaccines you need and how often you need them. This information is not intended to replace advice given to you by your health care provider. Make sure you discuss any questions you have with your health care provider. Document Released: 01/22/2015 Document Revised: 09/15/2015 Document Reviewed: 10/27/2014 Elsevier Interactive Patient Education  2017 [employer] Prevention in the Home Falls can cause injuries. They can happen to people of all ages. There are many things you can do to make your home safe and to help prevent falls. What can I do on the outside of my home? Regularly fix the edges of walkways and driveways and fix any cracks. Remove anything that might make you trip as you walk through a door, such as a raised step or threshold. Trim any bushes or trees on the path to your home. Use bright outdoor lighting. Clear any walking paths of anything that might make someone trip, such as rocks or tools. Regularly check to see if handrails are loose or broken. Make sure that both sides of any steps have handrails. Any raised decks and porches should have guardrails on the edges. Have any leaves, snow, or ice cleared regularly. Use sand or salt on walking paths during winter. Clean up any spills in your garage right away. This includes oil or grease spills. What can I do in the bathroom? Use night lights. Install grab bars by the toilet and in the tub and shower. Do not use towel bars as grab  bars. Use non-skid mats or decals in the tub or shower. If you need to sit down in the shower, use a plastic, non-slip stool. Keep the floor dry. Clean up any water that spills on the floor as soon as it happens. Remove soap buildup in the tub or shower regularly. Attach bath mats securely with double-sided non-slip rug tape. Do not have throw rugs and other things on the floor that can make you trip. What can I do in the bedroom? Use night lights. Make sure that you have a light by your bed that is easy to reach. Do not use any sheets or blankets that are too big for your bed. They should not hang down onto the floor. Have a firm chair that has side arms. You can use this for support while you get dressed. Do not have throw rugs and other things on the floor that can make you trip. What can I do in the kitchen? Clean up any spills right away. Avoid walking on wet floors. Keep items that you use a lot in easy-to-reach places. If you need to reach something above you, use a strong step stool that has a grab bar. Keep electrical cords out of the way. Do  not use floor polish or wax that makes floors slippery. If you must use wax, use non-skid floor wax. Do not have throw rugs and other things on the floor that can make you trip. What can I do with my stairs? Do not leave any items on the stairs. Make sure that there are handrails on both sides of the stairs and use them. Fix handrails that are broken or loose. Make sure that handrails are as long as the stairways. Check any carpeting to make sure that it is firmly attached to the stairs. Fix any carpet that is loose or worn. Avoid having throw rugs at the top or bottom of the stairs. If you do have throw rugs, attach them to the floor with carpet tape. Make sure that you have a light switch at the top of the stairs and the bottom of the stairs. If you do not have them, ask someone to add them for you. What else can I do to help prevent  falls? Wear shoes that: Do not have high heels. Have rubber bottoms. Are comfortable and fit you well. Are closed at the toe. Do not wear sandals. If you use a stepladder: Make sure that it is fully opened. Do not climb a closed stepladder. Make sure that both sides of the stepladder are locked into place. Ask someone to hold it for you, if possible. Clearly mark and make sure that you can see: Any grab bars or handrails. First and last steps. Where the edge of each step is. Use tools that help you move around (mobility aids) if they are needed. These include: Canes. Walkers. Scooters. Crutches. Turn on the lights when you go into a dark area. Replace any light bulbs as soon as they burn out. Set up your furniture so you have a clear path. Avoid moving your furniture around. If any of your floors are uneven, fix them. If there are any pets around you, be aware of where they are. Review your medicines with your doctor. Some medicines can make you feel dizzy. This can increase your chance of falling. Ask your doctor what other things that you can do to help prevent falls. This information is not intended to replace advice given to you by your health care provider. Make sure you discuss any questions you have with your health care provider. Document Released: 10/22/2008 Document Revised: 06/03/2015 Document Reviewed: 01/30/2014 Elsevier Interactive Patient Education  2017 [employer].

## 2022-04-19 DIAGNOSIS — L84 Corns and callosities: Secondary | ICD-10-CM | POA: Diagnosis not present

## 2022-04-19 DIAGNOSIS — M79672 Pain in left foot: Secondary | ICD-10-CM | POA: Diagnosis not present

## 2022-06-15 DIAGNOSIS — M7062 Trochanteric bursitis, left hip: Secondary | ICD-10-CM | POA: Diagnosis not present

## 2022-06-15 DIAGNOSIS — M7061 Trochanteric bursitis, right hip: Secondary | ICD-10-CM | POA: Diagnosis not present

## 2022-06-16 DIAGNOSIS — M1612 Unilateral primary osteoarthritis, left hip: Secondary | ICD-10-CM | POA: Diagnosis not present

## 2022-06-16 DIAGNOSIS — M7061 Trochanteric bursitis, right hip: Secondary | ICD-10-CM | POA: Diagnosis not present

## 2022-06-16 DIAGNOSIS — M7062 Trochanteric bursitis, left hip: Secondary | ICD-10-CM | POA: Diagnosis not present

## 2022-06-22 DIAGNOSIS — M1612 Unilateral primary osteoarthritis, left hip: Secondary | ICD-10-CM | POA: Diagnosis not present

## 2022-06-22 DIAGNOSIS — M7062 Trochanteric bursitis, left hip: Secondary | ICD-10-CM | POA: Diagnosis not present

## 2022-06-22 DIAGNOSIS — M7061 Trochanteric bursitis, right hip: Secondary | ICD-10-CM | POA: Diagnosis not present

## 2022-06-23 DIAGNOSIS — M7062 Trochanteric bursitis, left hip: Secondary | ICD-10-CM | POA: Diagnosis not present

## 2022-06-23 DIAGNOSIS — M1612 Unilateral primary osteoarthritis, left hip: Secondary | ICD-10-CM | POA: Diagnosis not present

## 2022-06-25 ENCOUNTER — Encounter: Payer: Self-pay | Admitting: Internal Medicine

## 2022-06-25 NOTE — Patient Instructions (Addendum)
     Medications changes include :   cough syrup, augmentin and eye drops     Return if symptoms worsen or fail to improve.  

## 2022-06-25 NOTE — Progress Notes (Unsigned)
Subjective:    Patient ID: Holly Cochran, female    DOB: June 14, 1950, 72 y.o.   MRN: [id number]      HPI Holly Cochran is here for No chief complaint on file.   She is here for an acute visit for sinus symptoms.   She is not sure when her symptoms started - she went to the dentist recently and had an x-ray and was told that her sinuses were a mess -look like a bad infections.  After that she realized that she was feeling a little dizzy at times and feeling out of it.  She did buy an Navage and started using it.  She does have some congestion, but denies any significant sinus pain or pressure or headaches.  She is experiencing        Medications and allergies reviewed with patient and updated if appropriate.  Current Outpatient Medications on File Prior to Visit  Medication Sig Dispense Refill   alendronate (FOSAMAX) 70 MG tablet TAKE ONE TABLET BY MOUTH EVERY 7 DAYS WITH A FULL GLASS OF WATER ON AN EMPTY STOMACH 12 tablet 3   aspirin 81 MG EC tablet Take 1 tablet (81 mg total) by mouth daily. Swallow whole. 30 tablet 12   Cholecalciferol (VITAMIN D3) 25 MCG (1000 UT) CAPS Take 1,000 Units by mouth daily.     escitalopram (LEXAPRO) 20 MG tablet TAKE 1 TABLET BY MOUTH ONCE DAILY. 90 tablet 1   levothyroxine (SYNTHROID) 125 MCG tablet TAKE ONE TABLET ONCE DAILY AND TAKE 1/2 TABLET ON TUESDAY, THURSDAY AND SATURDAY 70 tablet 1   losartan (COZAAR) 25 MG tablet TAKE 1 TABLET BY MOUTH ONCE DAILY. 90 tablet 1   meloxicam (MOBIC) 15 MG tablet Take 1 tablet (15 mg total) by mouth daily as needed for pain. 90 tablet 1   methocarbamol (ROBAXIN) 500 MG tablet TAKE 1 TABLET EVERY 6 HOURS AS NEEDED FOR MUSCLE SPASM. 180 tablet 3   omega-3 acid ethyl esters (LOVAZA) 1 g capsule Take 1 g by mouth daily.     omeprazole (PRILOSEC) 40 MG capsule TAKE ONE CAPSULE BY MOUTH ONCE DAILY 30 MINUTES BEFORE A MEAL 90 capsule 1   pravastatin (PRAVACHOL) 40 MG tablet TAKE ONE TABLET DAILY AT BEDTIME. 90  tablet 1   No current facility-administered medications on file prior to visit.    Review of Systems  Constitutional:  Negative for fever.  HENT:  Positive for congestion. Negative for ear pain, postnasal drip, sinus pain and sore throat.   Respiratory:  Negative for cough.   Neurological:  Positive for dizziness. Negative for headaches.       Objective:   Vitals:   06/26/22 0842  BP: 110/70  Pulse: 76  Temp: 98.3 F (36.8 C)  SpO2: 97%   BP Readings from Last 3 Encounters:  06/26/22 110/70  02/28/22 110/80  02/13/22 120/80   Wt Readings from Last 3 Encounters:  06/26/22 165 lb 9.6 oz (75.1 kg)  03/29/22 162 lb (73.5 kg)  02/28/22 165 lb (74.8 kg)   Body mass index is 27.56 kg/m.    Physical Exam Constitutional:      General: She is not in acute distress.    Appearance: Normal appearance. She is not ill-appearing.  HENT:     Head: Normocephalic and atraumatic.     Right Ear: Tympanic membrane, ear canal and external ear normal.     Left Ear: Tympanic membrane, ear canal and external ear normal.  Mouth/Throat:     Mouth: Mucous membranes are moist.     Pharynx: No oropharyngeal exudate or posterior oropharyngeal erythema.  Eyes:     Conjunctiva/sclera: Conjunctivae normal.  Cardiovascular:     Rate and Rhythm: Normal rate and regular rhythm.  Pulmonary:     Effort: Pulmonary effort is normal. No respiratory distress.     Breath sounds: Normal breath sounds. No wheezing or rales.  Musculoskeletal:     Cervical back: Neck supple. No tenderness.  Lymphadenopathy:     Cervical: No cervical adenopathy.  Skin:    General: Skin is warm and dry.  Neurological:     Mental Status: She is alert.            Assessment & Plan:    See Problem List for Assessment and Plan of chronic medical problems.

## 2022-06-26 ENCOUNTER — Ambulatory Visit: Payer: Medicare PPO | Admitting: Internal Medicine

## 2022-06-26 VITALS — BP 110/70 | HR 76 | Temp 98.3°F | Ht 65.0 in | Wt 165.6 lb

## 2022-06-26 DIAGNOSIS — J329 Chronic sinusitis, unspecified: Secondary | ICD-10-CM | POA: Insufficient documentation

## 2022-06-26 DIAGNOSIS — J320 Chronic maxillary sinusitis: Secondary | ICD-10-CM

## 2022-06-26 MED ORDER — AMOXICILLIN-POT CLAVULANATE 875-125 MG PO TABS: 1.0000 | ORAL_TABLET | Freq: Two times a day (BID) | ORAL | 0 refills | Status: AC

## 2022-06-26 NOTE — Assessment & Plan Note (Signed)
Chronic Symptoms likely chronic in nature with subtle symptoms, but significant sinus disease on imaging by her dentist Likely bacterial  Start Augmentin 875-125 mg BID x 10 day May need additional antibiotic given chronic nature-she will let me know how she does after completing the 10 days Call if no improvement

## 2022-06-27 DIAGNOSIS — M1612 Unilateral primary osteoarthritis, left hip: Secondary | ICD-10-CM | POA: Diagnosis not present

## 2022-06-27 DIAGNOSIS — M7062 Trochanteric bursitis, left hip: Secondary | ICD-10-CM | POA: Diagnosis not present

## 2022-06-27 DIAGNOSIS — M7061 Trochanteric bursitis, right hip: Secondary | ICD-10-CM | POA: Diagnosis not present

## 2022-07-10 DIAGNOSIS — M7061 Trochanteric bursitis, right hip: Secondary | ICD-10-CM | POA: Diagnosis not present

## 2022-07-10 DIAGNOSIS — M7062 Trochanteric bursitis, left hip: Secondary | ICD-10-CM | POA: Diagnosis not present

## 2022-07-10 DIAGNOSIS — M1612 Unilateral primary osteoarthritis, left hip: Secondary | ICD-10-CM | POA: Diagnosis not present

## 2022-07-20 DIAGNOSIS — M1612 Unilateral primary osteoarthritis, left hip: Secondary | ICD-10-CM | POA: Diagnosis not present

## 2022-07-20 DIAGNOSIS — M7061 Trochanteric bursitis, right hip: Secondary | ICD-10-CM | POA: Diagnosis not present

## 2022-07-20 DIAGNOSIS — M7062 Trochanteric bursitis, left hip: Secondary | ICD-10-CM | POA: Diagnosis not present

## 2022-07-24 DIAGNOSIS — M791 Myalgia, unspecified site: Secondary | ICD-10-CM | POA: Diagnosis not present

## 2022-07-24 DIAGNOSIS — M25512 Pain in left shoulder: Secondary | ICD-10-CM | POA: Diagnosis not present

## 2022-08-03 ENCOUNTER — Telehealth: Payer: Self-pay | Admitting: Internal Medicine

## 2022-08-03 NOTE — Telephone Encounter (Signed)
Pt wanted to know what she need to take because is going on a cruise to [place] and she scared she might catch Covid. Please advise.

## 2022-08-04 NOTE — Telephone Encounter (Signed)
Called pt inform her MD is out of the office. Was going to give the otc supplements, but she states she will be leaving next Friday. She has no sxs now, but she know 5 people from Biking cruise that got covid. Wanting to see what MD states. Inform pt will be Monday before she get msg../lmb

## 2022-08-06 NOTE — Telephone Encounter (Signed)
Message sent via my chart

## 2022-08-07 ENCOUNTER — Other Ambulatory Visit: Payer: Self-pay | Admitting: Internal Medicine

## 2022-08-08 MED ORDER — NIRMATRELVIR/RITONAVIR (PAXLOVID)TABLET: 3.0000 | ORAL_TABLET | Freq: Two times a day (BID) | ORAL | 0 refills | Status: AC

## 2022-08-08 NOTE — Telephone Encounter (Signed)
Patient called and said she would like the Paxlovid to be sent to [facility]. She was unable to respond via MyChart because she is currently having issues with it. Best callback is [phone].

## 2022-08-08 NOTE — Telephone Encounter (Signed)
Prescription sent

## 2022-08-08 NOTE — Addendum Note (Signed)
Addended by: Pincus Sanes on: 08/08/2022 04:43 PM   Modules accepted: Orders

## 2022-08-22 DIAGNOSIS — M25562 Pain in left knee: Secondary | ICD-10-CM | POA: Diagnosis not present

## 2022-08-22 DIAGNOSIS — M25462 Effusion, left knee: Secondary | ICD-10-CM | POA: Diagnosis not present

## 2022-08-25 ENCOUNTER — Other Ambulatory Visit: Payer: Self-pay | Admitting: Internal Medicine

## 2022-08-25 DIAGNOSIS — E890 Postprocedural hypothyroidism: Secondary | ICD-10-CM

## 2022-08-29 DIAGNOSIS — M25562 Pain in left knee: Secondary | ICD-10-CM | POA: Diagnosis not present

## 2022-09-05 DIAGNOSIS — M25562 Pain in left knee: Secondary | ICD-10-CM | POA: Diagnosis not present

## 2022-09-21 DIAGNOSIS — M25562 Pain in left knee: Secondary | ICD-10-CM | POA: Diagnosis not present

## 2022-09-26 ENCOUNTER — Encounter: Payer: Self-pay | Admitting: Internal Medicine

## 2022-09-26 DIAGNOSIS — M25562 Pain in left knee: Secondary | ICD-10-CM | POA: Diagnosis not present

## 2022-09-27 ENCOUNTER — Other Ambulatory Visit: Payer: Self-pay | Admitting: Internal Medicine

## 2022-09-27 ENCOUNTER — Telehealth: Payer: Self-pay

## 2022-09-27 DIAGNOSIS — E7800 Pure hypercholesterolemia, unspecified: Secondary | ICD-10-CM

## 2022-09-27 NOTE — Telephone Encounter (Signed)
-----   Message from Pincus Sanes sent at 09/27/2022  7:24 AM EDT ----- Regarding: Prolia Currently taking Fosamax and would like to transition to Prolia.,  Please look into coverage by her insurance company for the Prolia.  SB

## 2022-09-29 ENCOUNTER — Encounter: Payer: Self-pay | Admitting: Internal Medicine

## 2022-09-29 ENCOUNTER — Telehealth: Payer: Self-pay

## 2022-09-29 NOTE — Telephone Encounter (Signed)
Prolia VOB initiated via [email or web address]

## 2022-10-02 ENCOUNTER — Other Ambulatory Visit (HOSPITAL_COMMUNITY): Payer: Self-pay

## 2022-10-02 NOTE — Telephone Encounter (Signed)
Pharmacy Patient Advocate Encounter   Received notification from  [employer] Portal  that prior authorization for Prolia is required/requested.   Insurance verification completed.   The patient is insured through [place] .   Per test claim: PA required; PA submitted to [facility] via CoverMyMeds Key/confirmation #/EOC [id number] Status is pending

## 2022-10-03 NOTE — Telephone Encounter (Signed)
Pharmacy Patient Advocate Encounter  Received notification from [facility] that Prior Authorization for Prolia 60MG /ML syringes has been APPROVED from 10-02-2022 to 01-09-2023   PA #/Case ID/Reference #: [id number]

## 2022-10-05 ENCOUNTER — Other Ambulatory Visit (HOSPITAL_COMMUNITY): Payer: Self-pay

## 2022-10-05 NOTE — Telephone Encounter (Signed)
Patient scheduled.

## 2022-10-05 NOTE — Telephone Encounter (Signed)
Pt ready for scheduling for Prolia on or after : 10/05/22  Out-of-pocket cost due at time of visit: $40  Primary: [employer] - Medicare Prolia co-insurance: $40 Admin fee co-insurance: 0%  Secondary: N/A Prolia co-insurance:  Admin fee co-insurance:   Medical Benefit Details: Date Benefits were checked: 09/29/22 Deductible: no/ Coinsurance: $40/ Admin Fee: 0%  Prior Auth: APPROVED PA# [id number] Expiration Date: 10/02/22 - 01/09/23  # of doses approved:  Pharmacy benefit: Copay $64 If patient wants fill through the pharmacy benefit please send prescription to: [facility], and include estimated need by date in rx notes. Pharmacy will ship medication directly to the office.  Patient not eligible for Prolia Copay Card. Copay Card can make patient's cost as little as $25. Link to apply: [email or web address]  ** This summary of benefits is an estimation of the patient's out-of-pocket cost. Exact cost may very based on individual plan coverage.

## 2022-10-09 ENCOUNTER — Ambulatory Visit: Payer: Medicare PPO

## 2022-10-09 DIAGNOSIS — Z23 Encounter for immunization: Secondary | ICD-10-CM | POA: Diagnosis not present

## 2022-10-09 DIAGNOSIS — M810 Age-related osteoporosis without current pathological fracture: Secondary | ICD-10-CM | POA: Diagnosis not present

## 2022-10-09 MED ORDER — DENOSUMAB 60 MG/ML ~~LOC~~ SOSY
60.0000 mg | PREFILLED_SYRINGE | Freq: Once | SUBCUTANEOUS | Status: DC
Start: 2022-10-09 — End: 2022-10-09

## 2022-10-09 MED ORDER — DENOSUMAB 60 MG/ML ~~LOC~~ SOSY
60.0000 mg | PREFILLED_SYRINGE | Freq: Once | SUBCUTANEOUS | Status: AC
Start: 2022-10-09 — End: 2022-10-09
  Administered 2022-10-09: 60 mg via SUBCUTANEOUS

## 2022-10-09 NOTE — Progress Notes (Signed)
After obtaining consent, and per orders of Dr. Lawerance Bach, injection of Prolia and HDF shot was given by Ferdie Ping. Patient instructed to report any adverse reaction to me immediately.

## 2022-10-12 DIAGNOSIS — L821 Other seborrheic keratosis: Secondary | ICD-10-CM | POA: Diagnosis not present

## 2022-10-12 DIAGNOSIS — D229 Melanocytic nevi, unspecified: Secondary | ICD-10-CM | POA: Diagnosis not present

## 2022-10-12 DIAGNOSIS — D1801 Hemangioma of skin and subcutaneous tissue: Secondary | ICD-10-CM | POA: Diagnosis not present

## 2022-10-12 DIAGNOSIS — L814 Other melanin hyperpigmentation: Secondary | ICD-10-CM | POA: Diagnosis not present

## 2022-10-12 DIAGNOSIS — L578 Other skin changes due to chronic exposure to nonionizing radiation: Secondary | ICD-10-CM | POA: Diagnosis not present

## 2022-10-30 DIAGNOSIS — M542 Cervicalgia: Secondary | ICD-10-CM | POA: Diagnosis not present

## 2022-10-30 DIAGNOSIS — M25512 Pain in left shoulder: Secondary | ICD-10-CM | POA: Diagnosis not present

## 2022-11-02 DIAGNOSIS — M25512 Pain in left shoulder: Secondary | ICD-10-CM | POA: Diagnosis not present

## 2022-11-02 DIAGNOSIS — M542 Cervicalgia: Secondary | ICD-10-CM | POA: Diagnosis not present

## 2022-11-07 DIAGNOSIS — M25552 Pain in left hip: Secondary | ICD-10-CM | POA: Diagnosis not present

## 2022-11-07 DIAGNOSIS — M791 Myalgia, unspecified site: Secondary | ICD-10-CM | POA: Diagnosis not present

## 2022-11-07 DIAGNOSIS — R0781 Pleurodynia: Secondary | ICD-10-CM | POA: Diagnosis not present

## 2022-11-07 DIAGNOSIS — M7062 Trochanteric bursitis, left hip: Secondary | ICD-10-CM | POA: Diagnosis not present

## 2022-11-08 DIAGNOSIS — M25512 Pain in left shoulder: Secondary | ICD-10-CM | POA: Diagnosis not present

## 2022-11-08 DIAGNOSIS — M542 Cervicalgia: Secondary | ICD-10-CM | POA: Diagnosis not present

## 2022-11-14 DIAGNOSIS — M542 Cervicalgia: Secondary | ICD-10-CM | POA: Diagnosis not present

## 2022-11-14 DIAGNOSIS — M25512 Pain in left shoulder: Secondary | ICD-10-CM | POA: Diagnosis not present

## 2022-11-16 NOTE — Telephone Encounter (Signed)
Pharmacy Patient Advocate Encounter  Received notification from [facility] that Prior Authorization for Prolia has been APPROVED from 01/10/23 to 01/09/24   PA #/Case ID/Reference #: [id number]  Approval letter indexed to media tab

## 2022-11-21 DIAGNOSIS — M542 Cervicalgia: Secondary | ICD-10-CM | POA: Diagnosis not present

## 2022-11-21 DIAGNOSIS — M25512 Pain in left shoulder: Secondary | ICD-10-CM | POA: Diagnosis not present

## 2022-11-23 DIAGNOSIS — M25512 Pain in left shoulder: Secondary | ICD-10-CM | POA: Diagnosis not present

## 2022-11-23 DIAGNOSIS — M542 Cervicalgia: Secondary | ICD-10-CM | POA: Diagnosis not present

## 2022-11-27 DIAGNOSIS — M25512 Pain in left shoulder: Secondary | ICD-10-CM | POA: Diagnosis not present

## 2022-11-27 DIAGNOSIS — M542 Cervicalgia: Secondary | ICD-10-CM | POA: Diagnosis not present

## 2022-11-29 DIAGNOSIS — M25512 Pain in left shoulder: Secondary | ICD-10-CM | POA: Diagnosis not present

## 2022-11-29 DIAGNOSIS — M542 Cervicalgia: Secondary | ICD-10-CM | POA: Diagnosis not present

## 2022-12-01 DIAGNOSIS — M25512 Pain in left shoulder: Secondary | ICD-10-CM | POA: Diagnosis not present

## 2022-12-01 DIAGNOSIS — M542 Cervicalgia: Secondary | ICD-10-CM | POA: Diagnosis not present

## 2022-12-11 DIAGNOSIS — M25512 Pain in left shoulder: Secondary | ICD-10-CM | POA: Diagnosis not present

## 2022-12-11 DIAGNOSIS — M542 Cervicalgia: Secondary | ICD-10-CM | POA: Diagnosis not present

## 2022-12-13 ENCOUNTER — Encounter: Payer: Self-pay | Admitting: Internal Medicine

## 2022-12-13 NOTE — Patient Instructions (Addendum)
        See your chiropractor and let me know.     Return if symptoms worsen or fail to improve.

## 2022-12-13 NOTE — Progress Notes (Unsigned)
    Subjective:    Patient ID: Holly Cochran, female    DOB: 11/29/1950, 72 y.o.   MRN: [id number]      HPI Robbie is here for No chief complaint on file.    Back pain -     Medications and allergies reviewed with patient and updated if appropriate.  Current Outpatient Medications on File Prior to Visit  Medication Sig Dispense Refill   alendronate (FOSAMAX) 70 MG tablet TAKE ONE TABLET BY MOUTH EVERY 7 DAYS WITH A FULL GLASS OF WATER ON AN EMPTY STOMACH 12 tablet 3   aspirin 81 MG EC tablet Take 1 tablet (81 mg total) by mouth daily. Swallow whole. 30 tablet 12   Cholecalciferol (VITAMIN D3) 25 MCG (1000 UT) CAPS Take 1,000 Units by mouth daily.     escitalopram (LEXAPRO) 20 MG tablet TAKE ONE TABLET BY MOUTH ONCE DAILY 90 tablet 1   levothyroxine (SYNTHROID) 125 MCG tablet TAKE ONE TABLET ONCE DAILY AND TAKE 1/2 TABLET ON TUESDAY, THURSDAY AND SATURDAY 70 tablet 1   losartan (COZAAR) 25 MG tablet TAKE 1 TABLET BY MOUTH ONCE DAILY. 90 tablet 1   meloxicam (MOBIC) 15 MG tablet Take 1 tablet (15 mg total) by mouth daily as needed for pain. 90 tablet 1   methocarbamol (ROBAXIN) 500 MG tablet TAKE 1 TABLET EVERY 6 HOURS AS NEEDED FOR MUSCLE SPASM. 180 tablet 3   omega-3 acid ethyl esters (LOVAZA) 1 g capsule Take 1 g by mouth daily.     omeprazole (PRILOSEC) 40 MG capsule TAKE ONE CAPSULE BY MOUTH ONCE DAILY 30 MINUTES BEFORE A MEAL 90 capsule 1   pravastatin (PRAVACHOL) 40 MG tablet TAKE ONE TABLET DAILY AT BEDTIME 90 tablet 1   No current facility-administered medications on file prior to visit.    Review of Systems     Objective:  There were no vitals filed for this visit. BP Readings from Last 3 Encounters:  06/26/22 110/70  02/28/22 110/80  02/13/22 120/80   Wt Readings from Last 3 Encounters:  06/26/22 165 lb 9.6 oz (75.1 kg)  03/29/22 162 lb (73.5 kg)  02/28/22 165 lb (74.8 kg)   There is no height or weight on file to calculate BMI.    Physical Exam          Assessment & Plan:    See Problem List for Assessment and Plan of chronic medical problems.

## 2022-12-14 ENCOUNTER — Ambulatory Visit: Payer: Medicare PPO | Admitting: Internal Medicine

## 2022-12-14 VITALS — BP 124/78 | HR 74 | Temp 97.9°F | Ht 65.0 in | Wt 161.0 lb

## 2022-12-14 DIAGNOSIS — R0781 Pleurodynia: Secondary | ICD-10-CM | POA: Diagnosis not present

## 2022-12-14 DIAGNOSIS — M542 Cervicalgia: Secondary | ICD-10-CM | POA: Diagnosis not present

## 2022-12-14 DIAGNOSIS — S233XXA Sprain of ligaments of thoracic spine, initial encounter: Secondary | ICD-10-CM | POA: Diagnosis not present

## 2022-12-14 DIAGNOSIS — M9902 Segmental and somatic dysfunction of thoracic region: Secondary | ICD-10-CM | POA: Diagnosis not present

## 2022-12-14 DIAGNOSIS — M549 Dorsalgia, unspecified: Secondary | ICD-10-CM | POA: Insufficient documentation

## 2022-12-14 DIAGNOSIS — M9901 Segmental and somatic dysfunction of cervical region: Secondary | ICD-10-CM | POA: Diagnosis not present

## 2022-12-14 DIAGNOSIS — M62830 Muscle spasm of back: Secondary | ICD-10-CM | POA: Diagnosis not present

## 2022-12-14 DIAGNOSIS — M25512 Pain in left shoulder: Secondary | ICD-10-CM | POA: Diagnosis not present

## 2022-12-14 NOTE — Assessment & Plan Note (Signed)
More of a chronic issue, but has gotten worse Pain in posterior right ribs just right of the mid thoracic spine ?  Rib displaced which would be more of a chronic issue, but has gotten worse over the years X-ray unlikely to show it Could consider an MRI, but would recommend first she sees her chiropractor does he see if he can detect it and try to work it back into place If not she will contact me-can consider ultrasound or sports medicine referral

## 2022-12-18 ENCOUNTER — Encounter: Payer: Self-pay | Admitting: Internal Medicine

## 2022-12-18 DIAGNOSIS — M25512 Pain in left shoulder: Secondary | ICD-10-CM | POA: Diagnosis not present

## 2022-12-18 DIAGNOSIS — M542 Cervicalgia: Secondary | ICD-10-CM | POA: Diagnosis not present

## 2022-12-19 ENCOUNTER — Encounter: Payer: Self-pay | Admitting: Internal Medicine

## 2022-12-19 DIAGNOSIS — S233XXA Sprain of ligaments of thoracic spine, initial encounter: Secondary | ICD-10-CM | POA: Diagnosis not present

## 2022-12-19 DIAGNOSIS — M810 Age-related osteoporosis without current pathological fracture: Secondary | ICD-10-CM

## 2022-12-19 DIAGNOSIS — M9901 Segmental and somatic dysfunction of cervical region: Secondary | ICD-10-CM | POA: Diagnosis not present

## 2022-12-19 DIAGNOSIS — M62830 Muscle spasm of back: Secondary | ICD-10-CM | POA: Diagnosis not present

## 2022-12-19 DIAGNOSIS — M9902 Segmental and somatic dysfunction of thoracic region: Secondary | ICD-10-CM | POA: Diagnosis not present

## 2022-12-21 DIAGNOSIS — M542 Cervicalgia: Secondary | ICD-10-CM | POA: Diagnosis not present

## 2022-12-21 DIAGNOSIS — M25512 Pain in left shoulder: Secondary | ICD-10-CM | POA: Diagnosis not present

## 2022-12-22 DIAGNOSIS — M9902 Segmental and somatic dysfunction of thoracic region: Secondary | ICD-10-CM | POA: Diagnosis not present

## 2022-12-22 DIAGNOSIS — M62830 Muscle spasm of back: Secondary | ICD-10-CM | POA: Diagnosis not present

## 2022-12-22 DIAGNOSIS — M9901 Segmental and somatic dysfunction of cervical region: Secondary | ICD-10-CM | POA: Diagnosis not present

## 2022-12-22 DIAGNOSIS — S233XXA Sprain of ligaments of thoracic spine, initial encounter: Secondary | ICD-10-CM | POA: Diagnosis not present

## 2022-12-25 DIAGNOSIS — M542 Cervicalgia: Secondary | ICD-10-CM | POA: Diagnosis not present

## 2022-12-25 DIAGNOSIS — M25512 Pain in left shoulder: Secondary | ICD-10-CM | POA: Diagnosis not present

## 2022-12-27 DIAGNOSIS — S233XXA Sprain of ligaments of thoracic spine, initial encounter: Secondary | ICD-10-CM | POA: Diagnosis not present

## 2022-12-27 DIAGNOSIS — M9902 Segmental and somatic dysfunction of thoracic region: Secondary | ICD-10-CM | POA: Diagnosis not present

## 2022-12-27 DIAGNOSIS — M9901 Segmental and somatic dysfunction of cervical region: Secondary | ICD-10-CM | POA: Diagnosis not present

## 2022-12-27 DIAGNOSIS — M62830 Muscle spasm of back: Secondary | ICD-10-CM | POA: Diagnosis not present

## 2022-12-28 DIAGNOSIS — M25512 Pain in left shoulder: Secondary | ICD-10-CM | POA: Diagnosis not present

## 2022-12-28 DIAGNOSIS — M542 Cervicalgia: Secondary | ICD-10-CM | POA: Diagnosis not present

## 2022-12-29 DIAGNOSIS — M810 Age-related osteoporosis without current pathological fracture: Secondary | ICD-10-CM | POA: Diagnosis not present

## 2022-12-29 DIAGNOSIS — I10 Essential (primary) hypertension: Secondary | ICD-10-CM | POA: Diagnosis not present

## 2022-12-29 DIAGNOSIS — Z79890 Hormone replacement therapy (postmenopausal): Secondary | ICD-10-CM | POA: Diagnosis not present

## 2022-12-29 DIAGNOSIS — E785 Hyperlipidemia, unspecified: Secondary | ICD-10-CM | POA: Diagnosis not present

## 2022-12-29 DIAGNOSIS — E890 Postprocedural hypothyroidism: Secondary | ICD-10-CM | POA: Diagnosis not present

## 2022-12-29 DIAGNOSIS — F419 Anxiety disorder, unspecified: Secondary | ICD-10-CM | POA: Diagnosis not present

## 2022-12-29 DIAGNOSIS — M4600 Spinal enthesopathy, site unspecified: Secondary | ICD-10-CM | POA: Diagnosis not present

## 2022-12-29 DIAGNOSIS — K219 Gastro-esophageal reflux disease without esophagitis: Secondary | ICD-10-CM | POA: Diagnosis not present

## 2022-12-29 DIAGNOSIS — F325 Major depressive disorder, single episode, in full remission: Secondary | ICD-10-CM | POA: Diagnosis not present

## 2023-01-02 DIAGNOSIS — M9901 Segmental and somatic dysfunction of cervical region: Secondary | ICD-10-CM | POA: Diagnosis not present

## 2023-01-02 DIAGNOSIS — M9902 Segmental and somatic dysfunction of thoracic region: Secondary | ICD-10-CM | POA: Diagnosis not present

## 2023-01-02 DIAGNOSIS — S233XXA Sprain of ligaments of thoracic spine, initial encounter: Secondary | ICD-10-CM | POA: Diagnosis not present

## 2023-01-02 DIAGNOSIS — M62830 Muscle spasm of back: Secondary | ICD-10-CM | POA: Diagnosis not present

## 2023-01-12 DIAGNOSIS — M9902 Segmental and somatic dysfunction of thoracic region: Secondary | ICD-10-CM | POA: Diagnosis not present

## 2023-01-12 DIAGNOSIS — S233XXA Sprain of ligaments of thoracic spine, initial encounter: Secondary | ICD-10-CM | POA: Diagnosis not present

## 2023-01-12 DIAGNOSIS — M9901 Segmental and somatic dysfunction of cervical region: Secondary | ICD-10-CM | POA: Diagnosis not present

## 2023-01-12 DIAGNOSIS — M62830 Muscle spasm of back: Secondary | ICD-10-CM | POA: Diagnosis not present

## 2023-01-25 ENCOUNTER — Ambulatory Visit (INDEPENDENT_AMBULATORY_CARE_PROVIDER_SITE_OTHER)
Admission: RE | Admit: 2023-01-25 | Discharge: 2023-01-25 | Disposition: A | Discharge status: Home / Self Care | Payer: Medicare PPO | Source: Ambulatory Visit | Attending: Internal Medicine | Admitting: Internal Medicine

## 2023-01-25 DIAGNOSIS — M810 Age-related osteoporosis without current pathological fracture: Secondary | ICD-10-CM

## 2023-01-28 ENCOUNTER — Encounter: Payer: Self-pay | Admitting: Internal Medicine

## 2023-01-29 DIAGNOSIS — Z1231 Encounter for screening mammogram for malignant neoplasm of breast: Secondary | ICD-10-CM | POA: Diagnosis not present

## 2023-01-29 LAB — HM MAMMOGRAPHY

## 2023-02-06 ENCOUNTER — Encounter: Payer: Self-pay | Admitting: Internal Medicine

## 2023-02-06 NOTE — Patient Instructions (Incomplete)
     Have xrays downstairs.     Tylenol maximum per day is 3000 mg     Medications changes include :   None

## 2023-02-06 NOTE — Progress Notes (Unsigned)
    Subjective:    Patient ID: Holly Cochran, female    DOB: 1950-03-29, 73 y.o.   MRN: [id number]      HPI Holly Cochran is here for No chief complaint on file.    Back pain   - Pain in posterior right ribs just right of the mid thoracic spine.  She was here almost two months ago - this is chronic but has gotten worse.  Saw her chiropractor     Medications and allergies reviewed with patient and updated if appropriate.  Current Outpatient Medications on File Prior to Visit  Medication Sig Dispense Refill   aspirin 81 MG EC tablet Take 1 tablet (81 mg total) by mouth daily. Swallow whole. 30 tablet 12   Cholecalciferol (VITAMIN D3) 25 MCG (1000 UT) CAPS Take 1,000 Units by mouth daily.     escitalopram (LEXAPRO) 20 MG tablet TAKE ONE TABLET BY MOUTH ONCE DAILY 90 tablet 1   levothyroxine (SYNTHROID) 125 MCG tablet TAKE ONE TABLET ONCE DAILY AND TAKE 1/2 TABLET ON TUESDAY, THURSDAY AND SATURDAY 70 tablet 1   losartan (COZAAR) 25 MG tablet TAKE 1 TABLET BY MOUTH ONCE DAILY. 90 tablet 1   meloxicam (MOBIC) 15 MG tablet Take 1 tablet (15 mg total) by mouth daily as needed for pain. 90 tablet 1   methocarbamol (ROBAXIN) 500 MG tablet TAKE 1 TABLET EVERY 6 HOURS AS NEEDED FOR MUSCLE SPASM. 180 tablet 3   omega-3 acid ethyl esters (LOVAZA) 1 g capsule Take 1 g by mouth daily.     omeprazole (PRILOSEC) 40 MG capsule TAKE ONE CAPSULE BY MOUTH ONCE DAILY 30 MINUTES BEFORE A MEAL 90 capsule 1   pravastatin (PRAVACHOL) 40 MG tablet TAKE ONE TABLET DAILY AT BEDTIME 90 tablet 1   No current facility-administered medications on file prior to visit.    Review of Systems     Objective:  There were no vitals filed for this visit. BP Readings from Last 3 Encounters:  12/14/22 124/78  06/26/22 110/70  02/28/22 110/80   Wt Readings from Last 3 Encounters:  12/14/22 161 lb (73 kg)  06/26/22 165 lb 9.6 oz (75.1 kg)  03/29/22 162 lb (73.5 kg)   There is no height or weight on file to calculate  BMI.    Physical Exam         Assessment & Plan:    See Problem List for Assessment and Plan of chronic medical problems.

## 2023-02-07 ENCOUNTER — Ambulatory Visit: Payer: Medicare PPO | Admitting: Internal Medicine

## 2023-02-07 ENCOUNTER — Ambulatory Visit (INDEPENDENT_AMBULATORY_CARE_PROVIDER_SITE_OTHER): Payer: Medicare PPO

## 2023-02-07 VITALS — BP 126/80 | HR 77 | Temp 98.0°F | Ht 65.0 in | Wt 160.0 lb

## 2023-02-07 DIAGNOSIS — R0781 Pleurodynia: Secondary | ICD-10-CM

## 2023-02-07 DIAGNOSIS — M47814 Spondylosis without myelopathy or radiculopathy, thoracic region: Secondary | ICD-10-CM | POA: Diagnosis not present

## 2023-02-07 DIAGNOSIS — M549 Dorsalgia, unspecified: Secondary | ICD-10-CM

## 2023-02-07 DIAGNOSIS — G8929 Other chronic pain: Secondary | ICD-10-CM | POA: Diagnosis not present

## 2023-02-07 NOTE — Assessment & Plan Note (Addendum)
Chronic Has had this pain for years without obvious answer for what is causing it we will get x-rays today, but expect them to be normal-has had x-rays in the past Reviewed x-rays there are no obvious abnormalities Will pursue an MRI given the chronicity of this pain Symptomatic treatment with over-the-counter pain medication-Tylenol regularly helps, can take meloxicam 15 mg daily as needed, but ideally use this as needed only for more severe pain She will continue to see her chiropractor on a regular basis hoping that adjustments help Further evaluation and treat meant depending on MRI results

## 2023-02-08 ENCOUNTER — Encounter: Payer: Self-pay | Admitting: Internal Medicine

## 2023-02-08 DIAGNOSIS — E890 Postprocedural hypothyroidism: Secondary | ICD-10-CM

## 2023-02-12 MED ORDER — LEVOTHYROXINE SODIUM 125 MCG PO TABS: ORAL_TABLET | ORAL | 1 refills | Status: DC

## 2023-02-14 ENCOUNTER — Other Ambulatory Visit: Payer: Self-pay | Admitting: Internal Medicine

## 2023-02-17 ENCOUNTER — Encounter: Payer: Self-pay | Admitting: Internal Medicine

## 2023-02-18 ENCOUNTER — Encounter: Payer: Self-pay | Admitting: Internal Medicine

## 2023-02-22 ENCOUNTER — Ambulatory Visit
Admission: RE | Admit: 2023-02-22 | Discharge: 2023-02-22 | Disposition: A | Discharge status: Home / Self Care | Payer: Medicare PPO | Source: Ambulatory Visit | Attending: Internal Medicine | Admitting: Internal Medicine

## 2023-02-22 DIAGNOSIS — R0781 Pleurodynia: Secondary | ICD-10-CM

## 2023-02-22 DIAGNOSIS — M546 Pain in thoracic spine: Secondary | ICD-10-CM | POA: Diagnosis not present

## 2023-03-06 ENCOUNTER — Other Ambulatory Visit: Payer: Self-pay

## 2023-03-06 DIAGNOSIS — M810 Age-related osteoporosis without current pathological fracture: Secondary | ICD-10-CM

## 2023-03-06 MED ORDER — DENOSUMAB 60 MG/ML ~~LOC~~ SOSY: 60.0000 mg | PREFILLED_SYRINGE | Freq: Once | SUBCUTANEOUS | Status: AC

## 2023-03-07 ENCOUNTER — Encounter: Payer: Self-pay | Admitting: Internal Medicine

## 2023-03-08 ENCOUNTER — Encounter: Payer: Self-pay | Admitting: Internal Medicine

## 2023-03-08 DIAGNOSIS — M549 Dorsalgia, unspecified: Secondary | ICD-10-CM

## 2023-03-12 ENCOUNTER — Other Ambulatory Visit: Payer: Self-pay | Admitting: Internal Medicine

## 2023-03-12 ENCOUNTER — Other Ambulatory Visit: Payer: Self-pay

## 2023-03-12 DIAGNOSIS — M549 Dorsalgia, unspecified: Secondary | ICD-10-CM

## 2023-03-12 DIAGNOSIS — M5134 Other intervertebral disc degeneration, thoracic region: Secondary | ICD-10-CM

## 2023-03-20 ENCOUNTER — Telehealth: Payer: Self-pay

## 2023-03-20 DIAGNOSIS — M5440 Lumbago with sciatica, unspecified side: Secondary | ICD-10-CM | POA: Diagnosis not present

## 2023-03-20 DIAGNOSIS — M542 Cervicalgia: Secondary | ICD-10-CM | POA: Diagnosis not present

## 2023-03-20 NOTE — Telephone Encounter (Signed)
 Prolia VOB initiated via [email or web address]  Next Prolia inj DUE: 04/08/23

## 2023-03-22 DIAGNOSIS — H2513 Age-related nuclear cataract, bilateral: Secondary | ICD-10-CM | POA: Diagnosis not present

## 2023-03-22 DIAGNOSIS — H40033 Anatomical narrow angle, bilateral: Secondary | ICD-10-CM | POA: Diagnosis not present

## 2023-03-22 DIAGNOSIS — H524 Presbyopia: Secondary | ICD-10-CM | POA: Diagnosis not present

## 2023-03-22 DIAGNOSIS — H04123 Dry eye syndrome of bilateral lacrimal glands: Secondary | ICD-10-CM | POA: Diagnosis not present

## 2023-03-23 ENCOUNTER — Other Ambulatory Visit (HOSPITAL_COMMUNITY): Payer: Self-pay

## 2023-03-23 NOTE — Telephone Encounter (Signed)
 Marland Kitchen

## 2023-03-23 NOTE — Telephone Encounter (Signed)
 Pt ready for scheduling for PROLIA on or after : 04/08/23  Option# 1: Buy/Bill (Office supplied medication)  Out-of-pocket cost due at time of clinic visit: $40  Number of injection/visits approved: 2  Primary: [employer] Prolia co-insurance: $40 Admin fee co-insurance: 0%  Secondary: --- Prolia co-insurance:  Admin fee co-insurance:   Medical Benefit Details: Date Benefits were checked: 03/20/23 Deductible: NO/ Coinsurance: $40/ Admin Fee: 0%  Prior Auth: APPROVED PA# [id number] Expiration Date: 01/10/23-01/09/24  # of doses approved: 2 ----------------------------------------------------------------------- Option# 2- Med Obtained from pharmacy:  Pharmacy benefit: Copay $64 (Paid to pharmacy) Admin Fee: 0% (Pay at clinic)  Prior Auth: N/A PA# Expiration Date:   # of doses approved:   If patient wants fill through the pharmacy benefit please send prescription to: [employer], and include estimated need by date in rx notes. Pharmacy will ship medication directly to the office.  Patient NOT eligible for Prolia Copay Card. Copay Card can make patient's cost as little as $25. Link to apply: [email or web address]://www.amgensupportplus.com/copay  ** This summary of benefits is an estimation of the patient's out-of-pocket cost. Exact cost may very based on individual plan coverage.

## 2023-03-27 ENCOUNTER — Telehealth: Payer: Self-pay

## 2023-03-27 DIAGNOSIS — Z136 Encounter for screening for cardiovascular disorders: Secondary | ICD-10-CM

## 2023-03-27 DIAGNOSIS — M5440 Lumbago with sciatica, unspecified side: Secondary | ICD-10-CM | POA: Diagnosis not present

## 2023-03-27 NOTE — Telephone Encounter (Signed)
 Pt request Ct CAC - ordered

## 2023-03-29 DIAGNOSIS — M5440 Lumbago with sciatica, unspecified side: Secondary | ICD-10-CM | POA: Diagnosis not present

## 2023-03-30 ENCOUNTER — Other Ambulatory Visit: Payer: Self-pay | Admitting: Internal Medicine

## 2023-03-30 DIAGNOSIS — E7800 Pure hypercholesterolemia, unspecified: Secondary | ICD-10-CM

## 2023-04-02 DIAGNOSIS — M5440 Lumbago with sciatica, unspecified side: Secondary | ICD-10-CM | POA: Diagnosis not present

## 2023-04-04 DIAGNOSIS — M5440 Lumbago with sciatica, unspecified side: Secondary | ICD-10-CM | POA: Diagnosis not present

## 2023-04-05 DIAGNOSIS — M5440 Lumbago with sciatica, unspecified side: Secondary | ICD-10-CM | POA: Diagnosis not present

## 2023-04-05 DIAGNOSIS — M5136 Other intervertebral disc degeneration, lumbar region with discogenic back pain only: Secondary | ICD-10-CM | POA: Diagnosis not present

## 2023-04-05 DIAGNOSIS — M47816 Spondylosis without myelopathy or radiculopathy, lumbar region: Secondary | ICD-10-CM | POA: Diagnosis not present

## 2023-04-05 DIAGNOSIS — M5126 Other intervertebral disc displacement, lumbar region: Secondary | ICD-10-CM | POA: Diagnosis not present

## 2023-04-10 DIAGNOSIS — H2513 Age-related nuclear cataract, bilateral: Secondary | ICD-10-CM | POA: Diagnosis not present

## 2023-04-11 ENCOUNTER — Other Ambulatory Visit (INDEPENDENT_AMBULATORY_CARE_PROVIDER_SITE_OTHER): Payer: Self-pay

## 2023-04-11 ENCOUNTER — Encounter: Payer: Self-pay | Admitting: Orthopedic Surgery

## 2023-04-11 ENCOUNTER — Ambulatory Visit: Admitting: Orthopedic Surgery

## 2023-04-11 ENCOUNTER — Other Ambulatory Visit: Payer: Self-pay

## 2023-04-11 DIAGNOSIS — M25512 Pain in left shoulder: Secondary | ICD-10-CM | POA: Diagnosis not present

## 2023-04-11 DIAGNOSIS — M25551 Pain in right hip: Secondary | ICD-10-CM

## 2023-04-11 DIAGNOSIS — M7061 Trochanteric bursitis, right hip: Secondary | ICD-10-CM | POA: Diagnosis not present

## 2023-04-11 NOTE — Progress Notes (Addendum)
 Office Visit Note   Patient: Holly Cochran           Date of Birth: February 24, 1950           MRN: [id number] Visit Date: 04/11/2023 Requested by: Pincus Sanes, MD [address] [place],  [place] [address] PCP: Pincus Sanes, MD  Subjective: Chief Complaint  Patient presents with   Neck - Pain   Left Shoulder - Pain   Right Hip - Pain    HPI: Holly Cochran is a 73 y.o. female who presents to the office reporting left shoulder pain and right hip pain.  She describes left shoulder pain for 3 years following a fall.  Denies any radicular pain.  Pain is mostly in the anterior shoulder with some scapular pain.  Denies any numbness and tingling.  Describes painful range of motion but is not limited.  Also describes some neck pain.  Pain is a bigger problem than weakness.  Tylenol arthritis helps.  Has had dry needling in the shoulder.  With the fall 3 years ago she actually had bilateral elbow fractures.  Also reports right hip and groin pain for 1 year with tenderness to palpation around the trochanteric region.  She has been in physical therapy for longer than 6 weeks for these problems..                ROS: All systems reviewed are negative as they relate to the chief complaint within the history of present illness.  Patient denies fevers or chills.  Assessment & Plan: Visit Diagnoses:  1. Left shoulder pain, unspecified chronicity   2. Pain in right hip     Plan: Impression is trochanteric bursitis without much arthritis on plain radiographs today.  Plan is ultrasound-guided trochanteric injection into the bursa.  Will see how she does with that injection.  The shoulder exam and radiographs are pretty unremarkable.  I think this could be pain from the neck.  6-week return with decision for or against MRI scanning of the cervical spine at that time. Follow-Up Instructions: No follow-ups on file.   Orders:  Orders Placed This Encounter  Procedures   XR Cervical Spine 2 or 3  views   XR Shoulder Left   XR HIP UNILAT W OR W/O PELVIS 2-3 VIEWS RIGHT   US Guided Needle Placement - No Linked Charges   No orders of the defined types were placed in this encounter.     Procedures: Large Joint Inj: R greater trochanter on 04/14/2023 5:40 PM Indications: pain and diagnostic evaluation Details: 18 G 3.5 in needle, ultrasound-guided lateral approach  Arthrogram: No  Medications: 5 mL lidocaine 1 %; 80 mg methylPREDNISolone acetate 80 MG/ML; 4 mL bupivacaine 0.25 % Outcome: tolerated well, no immediate complications Procedure, treatment alternatives, risks and benefits explained, specific risks discussed. Consent was given by the patient. Immediately prior to procedure a time out was called to verify the correct patient, procedure, equipment, support staff and site/side marked as required. Patient was prepped and draped in the usual sterile fashion.       Clinical Data: No additional findings.  Objective: Vital Signs: LMP 01/09/2005   Physical Exam:  Constitutional: Patient appears well-developed HEENT:  Head: Normocephalic Eyes:EOM are normal Neck: Normal range of motion Cardiovascular: Normal rate Pulmonary/chest: Effort normal Neurologic: Patient is alert Skin: Skin is warm Psychiatric: Patient has normal mood and affect  Ortho Exam: Patient has bilateral 5 out of 5 grip EPL FPL interosseous  wrist flexion wrist extension bicep triceps and deltoid strength.  Bilateral palpable radial pulses and no paresthesias C5-T1 in either arm.  Neck range of motion flexion chin to chest with extension approximately 50 degrees with approximately 50 degrees of rotation bilaterally.  No masses lymphadenopathy or skin changes around the neck or shoulder girdle region bilaterally   Patient has no groin pain with internal/external rotation of either leg.  Does have tenderness to trochanteric palpation on the right not the left.  Hip flexion abduction adduction strength is  intact.  Shoulder exam demonstrates symmetric passive range of motion of 60/100/165.  Good rotator cuff strength infraspinatus supraspinatus and subscap muscle testing with no discrete AC joint tenderness on direct palpation on either the left or right-hand side.  No coarse grinding or crepitus with internal/external Tatian of the left shoulder 9 degrees of abduction.  Negative O'Brien's testing and no tenderness in the bicipital groove.  Specialty Comments:  No specialty comments available.  Imaging: No results found.   PMFS History: Patient Active Problem List   Diagnosis Date Noted   Mid back pain on right side 12/14/2022   Chronic sinus infection 06/26/2022   Chronic upper back pain 01/17/2021   Left upper arm pain 01/17/2021   TMJ arthralgia 04/14/2019   Essential hypertension 03/15/2019   Intertrochanteric fracture of left femur (HCC) 11/18/2017   Vitamin D deficiency 08/31/2016   Family history of diabetes mellitus in mother 01/30/2016   Anxiety and depression 03/02/2015   Chronically dry eyes 07/20/2014   Common peroneal neuropathy of left lower extremity 05/20/2014   GERD (gastroesophageal reflux disease) 02/08/2011   RHINITIS 11/24/2008   GANGLION OF TENDON SHEATH 11/10/2008   Hyperlipidemia 08/27/2007   Osteoporosis 08/27/2007   Hypothyroidism, postradioiodine therapy 06/01/2006   Past Medical History:  Diagnosis Date   Common peroneal neuropathy of left lower extremity 05/20/2014   FASCIITIS, PLANTAR 10/14/2007   Qualifier: Diagnosis of  By: Alwyn Ren MD, Chrissie Noa     GERD (gastroesophageal reflux disease)    Hyperlipidemia    Osteopenia 2015   T score -1.1   Thyroid disease    hyperthyroid-RA I    Family History  Problem Relation Age of Onset   Diabetes Mother    Kidney disease Mother    Hypertension Mother    Cancer Father        Pancreatic   Breast cancer Paternal Aunt        Age 27's   Cancer Maternal Uncle        melanoma   Diabetes Maternal Uncle     Colon cancer Neg Hx    Esophageal cancer Neg Hx    Stomach cancer Neg Hx    Rectal cancer Neg Hx     Past Surgical History:  Procedure Laterality Date   COLONOSCOPY  2005    Dr Jarold Motto, negative   INTRAMEDULLARY (IM) NAIL INTERTROCHANTERIC Left 11/18/2017   Procedure: INTRAMEDULLARY (IM) NAIL INTERTROCHANTRIC;  Surgeon: Jodi Geralds, MD;  Location: [facility];  Service: Orthopedics;  Laterality: Left;   PLANTAR FASCIA SURGERY  08/2010   TONSILLECTOMY     Social History   Occupational History   Not on file  Tobacco Use   Smoking status: Never   Smokeless tobacco: Never  Substance and Sexual Activity   Alcohol use: Yes    Alcohol/week: 1.0 standard drink of alcohol    Types: 1 Standard drinks or equivalent per week    Comment: occasionally   Drug use: No   Sexual  activity: Never    Birth control/protection: Abstinence, Post-menopausal    Comment: Virgin

## 2023-04-14 DIAGNOSIS — M25551 Pain in right hip: Secondary | ICD-10-CM | POA: Diagnosis not present

## 2023-04-14 DIAGNOSIS — M25512 Pain in left shoulder: Secondary | ICD-10-CM | POA: Diagnosis not present

## 2023-04-14 DIAGNOSIS — M7061 Trochanteric bursitis, right hip: Secondary | ICD-10-CM | POA: Diagnosis not present

## 2023-04-14 MED ORDER — BUPIVACAINE HCL 0.25 % IJ SOLN
4.0000 mL | INTRAMUSCULAR | Status: AC | PRN
Start: 2023-04-14 — End: 2023-04-14
  Administered 2023-04-14: 4 mL via INTRA_ARTICULAR

## 2023-04-14 MED ORDER — METHYLPREDNISOLONE ACETATE 80 MG/ML IJ SUSP
80.0000 mg | INTRAMUSCULAR | Status: AC | PRN
  Administered 2023-04-14: 80 mg via INTRA_ARTICULAR

## 2023-04-14 MED ORDER — LIDOCAINE HCL 1 % IJ SOLN
5.0000 mL | INTRAMUSCULAR | Status: AC | PRN
  Administered 2023-04-14: 5 mL

## 2023-04-14 NOTE — Addendum Note (Signed)
 Addended by: Rise Paganini on: 04/14/2023 05:41 PM   Modules accepted: Level of Service

## 2023-04-24 ENCOUNTER — Ambulatory Visit

## 2023-04-24 VITALS — Ht 65.0 in | Wt 155.0 lb

## 2023-04-24 DIAGNOSIS — Z0000 Encounter for general adult medical examination without abnormal findings: Secondary | ICD-10-CM

## 2023-04-24 NOTE — Patient Instructions (Addendum)
 Holly Cochran , Thank you for taking time to come for your Medicare Wellness Visit. I appreciate your ongoing commitment to your health goals. Please review the following plan we discussed and let me know if I can assist you in the future.   Referrals/Orders/Follow-Ups/Clinician Recommendations: Aim for 30 minutes of exercise or brisk walking, 6-8 glasses of water, and 5 servings of fruits and vegetables each day.   This is a list of the screening recommended for you and due dates:  Health Maintenance  Topic Date Due   COVID-19 Vaccine (5 - 2024-25 season) 09/10/2022   Flu Shot  08/10/2023   Medicare Annual Wellness Visit  04/23/2024   Colon Cancer Screening  07/07/2024   DEXA scan (bone density measurement)  01/24/2025   Mammogram  01/28/2025   DTaP/Tdap/Td vaccine (2 - Td or Tdap) 01/30/2026   Pneumonia Vaccine  Completed   Hepatitis C Screening  Completed   Zoster (Shingles) Vaccine  Completed   HPV Vaccine  Aged Out   Meningitis B Vaccine  Aged Out    Advanced directives: (Copy Requested) Please bring a copy of your health care power of attorney and living will to the office to be added to your chart at your convenience. You can mail to [facility] [address] [place], [place] [address] or email to [email or web address]  Next Medicare Annual Wellness Visit scheduled for next year: Yes

## 2023-04-24 NOTE — Progress Notes (Signed)
 Subjective:   Holly Cochran is a 73 y.o. who presents for a Medicare Wellness preventive visit.  Visit Complete: Virtual I connected with  Holly Cochran on 04/24/23 by a audio enabled telemedicine application and verified that I am speaking with the correct person using two identifiers.  Patient Location: Home  Provider Location: Office/Clinic  I discussed the limitations of evaluation and management by telemedicine. The patient expressed understanding and agreed to proceed.  Vital Signs: Because this visit was a virtual/telehealth visit, some criteria may be missing or patient reported. Any vitals not documented were not able to be obtained and vitals that have been documented are patient reported.  VideoDeclined- This patient declined Librarian, academic. Therefore the visit was completed with audio only.  Persons Participating in Visit: Patient.  AWV Questionnaire: No: Patient Medicare AWV questionnaire was not completed prior to this visit.  Cardiac Risk Factors include: advanced age (>51men, >55 women);hypertension;dyslipidemia     Objective:    Today's Vitals   04/24/23 0810  Weight: 155 lb (70.3 kg)  Height: 5\' 5"  (1.651 m)   Body mass index is 25.79 kg/m.     04/24/2023    8:09 AM 03/29/2022    8:52 AM 02/22/2021   12:22 PM 12/22/2020    8:44 AM 10/02/2019    1:19 PM 11/18/2017    7:00 PM 11/18/2017    8:46 AM  Advanced Directives  Does Patient Have a Medical Advance Directive? Yes Yes Yes Yes Yes Yes Yes  Type of Estate agent of [place];Living will Healthcare Power of [place];Living will Healthcare Power of [place];Living will Living will;Healthcare Power of Attorney Living will;Healthcare Power of Attorney Living will;Healthcare Power of Attorney   Does patient want to make changes to medical advance directive?   No - Patient declined No - Patient declined No - Patient declined No - Patient declined    Copy of Healthcare Power of Attorney in Chart? No - copy requested No - copy requested Yes - validated most recent copy scanned in chart (See row information) No - copy requested No - copy requested No - copy requested     Current Medications (verified) Outpatient Encounter Medications as of 04/24/2023  Medication Sig   Acetaminophen (TYLENOL ARTHRITIS PAIN PO) Take by mouth.   aspirin 81 MG EC tablet Take 1 tablet (81 mg total) by mouth daily. Swallow whole.   Cholecalciferol (VITAMIN D3) 25 MCG (1000 UT) CAPS Take 1,000 Units by mouth daily.   escitalopram (LEXAPRO) 20 MG tablet TAKE ONE TABLET BY MOUTH ONCE DAILY   levothyroxine (SYNTHROID) 125 MCG tablet TAKE ONE TABLET ONCE DAILY AND TAKE 1/2 TABLET TWO DAYS A WEEK   meloxicam (MOBIC) 15 MG tablet Take 1 tablet (15 mg total) by mouth daily as needed for pain.   methocarbamol (ROBAXIN) 500 MG tablet TAKE 1 TABLET EVERY 6 HOURS AS NEEDED FOR MUSCLE SPASM.   omega-3 acid ethyl esters (LOVAZA) 1 g capsule Take 1 g by mouth daily.   omeprazole (PRILOSEC) 40 MG capsule TAKE ONE CAPSULE BY MOUTH ONCE DAILY 30 MINUTES BEFORE A MEAL   pravastatin (PRAVACHOL) 40 MG tablet TAKE ONE TABLET DAILY AT BEDTIME   [DISCONTINUED] losartan (COZAAR) 25 MG tablet TAKE 1 TABLET BY MOUTH ONCE DAILY.   Facility-Administered Encounter Medications as of 04/24/2023  Medication   denosumab (PROLIA) injection 60 mg    Allergies (verified) Sulfasalazine   History: Past Medical History:  Diagnosis Date   Common peroneal  neuropathy of left lower extremity 05/20/2014   FASCIITIS, PLANTAR 10/14/2007   Qualifier: Diagnosis of  By: Donnice Gale MD, Sammie Crigler     GERD (gastroesophageal reflux disease)    Hyperlipidemia    Osteopenia 2015   T score -1.1   Thyroid disease    hyperthyroid-RA I   Past Surgical History:  Procedure Laterality Date   COLONOSCOPY  2005    Dr Adan Holms, negative   INTRAMEDULLARY (IM) NAIL INTERTROCHANTERIC Left 11/18/2017   Procedure:  INTRAMEDULLARY (IM) NAIL INTERTROCHANTRIC;  Surgeon: Neil Balls, MD;  Location: [facility];  Service: Orthopedics;  Laterality: Left;   PLANTAR FASCIA SURGERY  08/2010   TONSILLECTOMY     Family History  Problem Relation Age of Onset   Diabetes Mother    Kidney disease Mother    Hypertension Mother    Cancer Father        Pancreatic   Breast cancer Paternal Aunt        Age 85's   Cancer Maternal Uncle        melanoma   Diabetes Maternal Uncle    Colon cancer Neg Hx    Esophageal cancer Neg Hx    Stomach cancer Neg Hx    Rectal cancer Neg Hx    Social History   Socioeconomic History   Marital status: Single    Spouse name: Not on file   Number of children: Not on file   Years of education: Not on file   Highest education level: Not on file  Occupational History   Not on file  Tobacco Use   Smoking status: Never    Passive exposure: Never   Smokeless tobacco: Never  Vaping Use   Vaping status: Not on file  Substance and Sexual Activity   Alcohol use: Yes    Alcohol/week: 1.0 standard drink of alcohol    Types: 1 Standard drinks or equivalent per week    Comment: occasionally   Drug use: No   Sexual activity: Never    Birth control/protection: Abstinence, Post-menopausal    Comment: Virgin  Other Topics Concern   Not on file  Social History Narrative   Not on file   Social Drivers of Health   Financial Resource Strain: Low Risk  (04/24/2023)   Overall Financial Resource Strain (CARDIA)    Difficulty of Paying Living Expenses: Not hard at all  Food Insecurity: No Food Insecurity (04/24/2023)   Hunger Vital Sign    Worried About Running Out of Food in the Last Year: Never true    Ran Out of Food in the Last Year: Never true  Transportation Needs: No Transportation Needs (04/24/2023)   PRAPARE - Administrator, Civil Service (Medical): No    Lack of Transportation (Non-Medical): No  Physical Activity: Insufficiently Active (04/24/2023)   Exercise Vital  Sign    Days of Exercise per Week: 5 days    Minutes of Exercise per Session: 10 min  Stress: No Stress Concern Present (04/24/2023)   [employer] of Occupational Health - Occupational Stress Questionnaire    Feeling of Stress : Not at all  Social Connections: Moderately Integrated (04/24/2023)   Social Connection and Isolation Panel [NHANES]    Frequency of Communication with Friends and Family: More than three times a week    Frequency of Social Gatherings with Friends and Family: More than three times a week    Attends Religious Services: More than 4 times per year    Active Member of [employer]  or Organizations: Yes    Attends Engineer, structural: More than 4 times per year    Marital Status: Never married    Tobacco Counseling Counseling given: No    Clinical Intake:  Pre-visit preparation completed: Yes  Pain : No/denies pain     BMI - recorded: 25.79 Nutritional Status: BMI 25 -29 Overweight Nutritional Risks: None Diabetes: No  Lab Results  Component Value Date   HGBA1C 5.7 05/20/2020   HGBA1C 5.6 04/04/2019   HGBA1C 5.8 03/13/2018     How often do you need to have someone help you when you read instructions, pamphlets, or other written materials from your doctor or pharmacy?: 1 - Never  Interpreter Needed?: No  Information entered by :: Kandy Orris, CMA   Activities of Daily Living     04/24/2023    8:13 AM  In your present state of health, do you have any difficulty performing the following activities:  Hearing? 0  Vision? 0  Difficulty concentrating or making decisions? 0  Walking or climbing stairs? 0  Dressing or bathing? 0  Doing errands, shopping? 0  Preparing Food and eating ? N  Using the Toilet? N  In the past six months, have you accidently leaked urine? N  Do you have problems with loss of bowel control? N  Managing your Medications? N  Managing your Finances? N  Housekeeping or managing your Housekeeping? N    Patient  Care Team: Colene Dauphin, MD as PCP - General (Internal Medicine) Elvin Hammer, MD as Consulting Physician (Family Medicine) Devon Fogo, MD (Inactive) as Consulting Physician (Dermatology) Cindra Cree, MD as Consulting Physician (Ophthalmology)  Indicate any recent Medical Services you may have received from other than [facility] providers in the past year (date may be approximate).     Assessment:   This is a routine wellness examination for [place].  Hearing/Vision screen Hearing Screening - Comments:: Denies hearing difficulties   Vision Screening - Comments:: Wears rx glasses - up to date with routine eye exams with Dr Cindra Cree   Goals Addressed               This Visit's Progress     Patient Stated (pt-stated)        Patient stated that she plans to get back pain managed and see if need surgery.         Depression Screen     04/24/2023    8:18 AM 02/07/2023    1:26 PM 12/14/2022    7:55 AM 06/26/2022    8:47 AM 03/29/2022    8:54 AM 01/24/2022   10:06 AM 08/18/2021   11:54 AM  PHQ 2/9 Scores  PHQ - 2 Score 0 0 0 0 0 0 0  PHQ- 9 Score 0 0 0   0     Fall Risk     04/24/2023    8:14 AM 02/07/2023    1:26 PM 12/14/2022    7:55 AM 06/26/2022    8:46 AM 03/29/2022    8:53 AM  Fall Risk   Falls in the past year? 0 0 0 0 0  Number falls in past yr: 0 0 0 0 0  Injury with Fall? 0 0  0 0  Risk for fall due to : No Fall Risks No Fall Risks No Fall Risks No Fall Risks No Fall Risks  Follow up Falls prevention discussed;Falls evaluation completed Falls evaluation completed Falls evaluation completed Falls evaluation completed Falls prevention discussed  MEDICARE RISK AT HOME:  Medicare Risk at Home Any stairs in or around the home?: Yes If so, are there any without handrails?: No Home free of loose throw rugs in walkways, pet beds, electrical cords, etc?: Yes Adequate lighting in your home to reduce risk of falls?: Yes Life alert?: No Use of a cane, walker or  w/c?: No Grab bars in the bathroom?: Yes Shower chair or bench in shower?: No Elevated toilet seat or a handicapped toilet?: Yes  TIMED UP AND GO:  Was the test performed?  No  Cognitive Function: 6CIT completed        04/24/2023    8:16 AM 03/29/2022    8:55 AM  6CIT Screen  What Year? 0 points 0 points  What month? 0 points 0 points  What time? 0 points 0 points  Count back from 20 0 points 0 points  Months in reverse 0 points 0 points  Repeat phrase 2 points 0 points  Total Score 2 points 0 points    Immunizations Immunization History  Administered Date(s) Administered   Fluad Quad(high Dose 65+) 10/10/2018, 11/05/2018, 12/05/2021   Fluad Trivalent(High Dose 65+) 10/09/2022   Influenza, High Dose Seasonal PF 11/10/2016, 09/20/2020   Influenza, Seasonal, Injecte, Preservative Fre 11/03/2013   Influenza,inj,Quad PF,6+ Mos 11/19/2015   Influenza,inj,quad, With Preservative 12/02/2014   Influenza-Unspecified 09/09/2012, 12/02/2014, 10/10/2015   PFIZER(Purple Top)SARS-COV-2 Vaccination 01/30/2019, 02/20/2019   Pfizer Covid-19 Vaccine Bivalent Booster 27yrs & up 09/20/2020   Pfizer(Comirnaty)Fall Seasonal Vaccine 12 years and older 10/06/2021   Pneumococcal Conjugate-13 02/05/2017   Pneumococcal Polysaccharide-23 04/14/2019   Respiratory Syncytial Virus Vaccine,Recomb Aduvanted(Arexvy) 12/17/2021   Tdap 01/31/2016   Zoster Recombinant(Shingrix) 07/20/2020, 11/24/2020   Zoster, Live 12/09/2014    Screening Tests Health Maintenance  Topic Date Due   COVID-19 Vaccine (5 - 2024-25 season) 09/10/2022   INFLUENZA VACCINE  08/10/2023   Medicare Annual Wellness (AWV)  04/23/2024   Colonoscopy  07/07/2024   DEXA SCAN  01/24/2025   MAMMOGRAM  01/28/2025   DTaP/Tdap/Td (2 - Td or Tdap) 01/30/2026   Pneumonia Vaccine 80+ Years old  Completed   Hepatitis C Screening  Completed   Zoster Vaccines- Shingrix  Completed   HPV VACCINES  Aged Out   Meningococcal B Vaccine  Aged  Out    Health Maintenance  Health Maintenance Due  Topic Date Due   COVID-19 Vaccine (5 - 2024-25 season) 09/10/2022   Health Maintenance Items Addressed: 04/24/2023   Additional Screening:  Vision Screening: Recommended annual ophthalmology exams for early detection of glaucoma and other disorders of the eye.  Dental Screening: Recommended annual dental exams for proper oral hygiene  Community Resource Referral / Chronic Care Management: CRR required this visit?  No   CCM required this visit?  No     Plan:     I have personally reviewed and noted the following in the patient's chart:   Medical and social history Use of alcohol, tobacco or illicit drugs  Current medications and supplements including opioid prescriptions. Patient is not currently taking opioid prescriptions. Functional ability and status Nutritional status Physical activity Advanced directives List of other physicians Hospitalizations, surgeries, and ER visits in previous 12 months Vitals Screenings to include cognitive, depression, and falls Referrals and appointments  In addition, I have reviewed and discussed with patient certain preventive protocols, quality metrics, and best practice recommendations. A written personalized care plan for preventive services as well as general preventive health recommendations were provided to patient.  Patria Bookbinder, CMA   04/24/2023   After Visit Summary: (MyChart) Due to this being a telephonic visit, the after visit summary with patients personalized plan was offered to patient via MyChart   Notes: Nothing significant to report at this time.

## 2023-04-25 ENCOUNTER — Encounter: Payer: Self-pay | Admitting: Internal Medicine

## 2023-04-25 ENCOUNTER — Ambulatory Visit (HOSPITAL_COMMUNITY)
Admission: RE | Admit: 2023-04-25 | Discharge: 2023-04-25 | Disposition: A | Discharge status: Home / Self Care | Payer: Self-pay | Source: Ambulatory Visit | Attending: Internal Medicine | Admitting: Internal Medicine

## 2023-04-25 DIAGNOSIS — Z136 Encounter for screening for cardiovascular disorders: Secondary | ICD-10-CM | POA: Insufficient documentation

## 2023-04-26 DIAGNOSIS — M5440 Lumbago with sciatica, unspecified side: Secondary | ICD-10-CM | POA: Diagnosis not present

## 2023-05-02 DIAGNOSIS — H25811 Combined forms of age-related cataract, right eye: Secondary | ICD-10-CM | POA: Diagnosis not present

## 2023-05-02 DIAGNOSIS — H2511 Age-related nuclear cataract, right eye: Secondary | ICD-10-CM | POA: Diagnosis not present

## 2023-05-08 DIAGNOSIS — R7303 Prediabetes: Secondary | ICD-10-CM | POA: Insufficient documentation

## 2023-05-08 NOTE — Progress Notes (Unsigned)
 Subjective:    Patient ID: Holly Cochran, female    DOB: May 01, 1950, 73 y.o.   MRN: [id number]      HPI Palin is here for a Physical exam and her chronic medical problems.   S/p cataract in right eye.  She saw Dr. Cram-neurosurgery-thought she had L1-L2 radicular symptoms.  She did have a lumbar MRI.  He recommended PT, dry needling.  Saw Dr Rozelle Corning - had shot in right lateral hip and in back.  That helped.  Taking tylenol  arthritis - 2/day.  Rarely uses meloxicam  or methocarbamol .    When she walks she has intermittent left upper leg pain.  She is planning on going back to see Dr. Rozelle Corning to see what his thoughts are.    Medications and allergies reviewed with patient and updated if appropriate.  Current Outpatient Medications on File Prior to Visit  Medication Sig Dispense Refill   Acetaminophen  (TYLENOL  ARTHRITIS PAIN PO) Take by mouth.     aspirin  81 MG EC tablet Take 1 tablet (81 mg total) by mouth daily. Swallow whole. 30 tablet 12   Cholecalciferol (VITAMIN D3) 25 MCG (1000 UT) CAPS Take 1,000 Units by mouth daily.     escitalopram  (LEXAPRO ) 20 MG tablet TAKE ONE TABLET BY MOUTH ONCE DAILY 90 tablet 1   levothyroxine  (SYNTHROID ) 125 MCG tablet TAKE ONE TABLET ONCE DAILY AND TAKE 1/2 TABLET TWO DAYS A WEEK 72 tablet 1   meloxicam  (MOBIC ) 15 MG tablet Take 1 tablet (15 mg total) by mouth daily as needed for pain. 90 tablet 1   methocarbamol  (ROBAXIN ) 500 MG tablet TAKE 1 TABLET EVERY 6 HOURS AS NEEDED FOR MUSCLE SPASM. 180 tablet 3   omega-3 acid ethyl esters (LOVAZA) 1 g capsule Take 1 g by mouth daily.     omeprazole  (PRILOSEC) 40 MG capsule TAKE ONE CAPSULE BY MOUTH ONCE DAILY 30 MINUTES BEFORE A MEAL 90 capsule 1   pravastatin  (PRAVACHOL ) 40 MG tablet TAKE ONE TABLET DAILY AT BEDTIME 90 tablet 1   Current Facility-Administered Medications on File Prior to Visit  Medication Dose Route Frequency Provider Last Rate Last Admin   denosumab  (PROLIA ) injection 60 mg  60  mg Subcutaneous Once Colene Dauphin, MD        Review of Systems  Constitutional:  Negative for fever.  Eyes:  Negative for visual disturbance.  Respiratory:  Negative for cough, shortness of breath and wheezing.   Cardiovascular:  Negative for chest pain, palpitations and leg swelling.  Gastrointestinal:  Negative for abdominal pain, blood in stool, constipation and diarrhea.       Rare gerd  Genitourinary:  Negative for dysuria.  Musculoskeletal:  Positive for arthralgias and back pain.  Skin:  Negative for rash.  Neurological:  Negative for light-headedness and headaches.  Psychiatric/Behavioral:  Negative for dysphoric mood and sleep disturbance. The patient is not nervous/anxious.        Objective:   Vitals:   05/09/23 0900  BP: 126/80  Pulse: 71  Temp: 98 F (36.7 C)  SpO2: 94%   Filed Weights   05/09/23 0900  Weight: 157 lb (71.2 kg)   Body mass index is 26.13 kg/m.  BP Readings from Last 3 Encounters:  05/09/23 126/80  02/07/23 126/80  12/14/22 124/78    Wt Readings from Last 3 Encounters:  05/09/23 157 lb (71.2 kg)  04/24/23 155 lb (70.3 kg)  02/07/23 160 lb (72.6 kg)       Physical Exam Constitutional:  She appears well-developed and well-nourished. No distress.  HENT:  Head: Normocephalic and atraumatic.  Right Ear: External ear normal. Normal ear canal and TM Left Ear: External ear normal.  Normal ear canal and TM Mouth/Throat: Oropharynx is clear and moist.  Eyes: Conjunctivae normal.  Neck: Neck supple. No tracheal deviation present. No thyromegaly present.  No carotid bruit  Cardiovascular: Normal rate, regular rhythm and normal heart sounds.   No murmur heard.  No edema. Pulmonary/Chest: Effort normal and breath sounds normal. No respiratory distress. She has no wheezes. She has no rales.  Breast: deferred   Abdominal: Soft. She exhibits no distension. There is no tenderness.  Lymphadenopathy: She has no cervical adenopathy.  Skin: Skin  is warm and dry. She is not diaphoretic.  Psychiatric: She has a normal mood and affect. Her behavior is normal.     Lab Results  Component Value Date   WBC 5.1 01/24/2022   HGB 14.1 01/24/2022   HCT 41.7 01/24/2022   PLT 307.0 01/24/2022   GLUCOSE 96 01/24/2022   CHOL 137 01/24/2022   TRIG 169.0 (H) 01/24/2022   HDL 31.30 (L) 01/24/2022   LDLCALC 72 01/24/2022   ALT 19 01/24/2022   AST 22 01/24/2022   NA 139 01/24/2022   K 5.1 01/24/2022   CL 102 01/24/2022   CREATININE 0.80 01/24/2022   BUN 13 01/24/2022   CO2 31 01/24/2022   TSH 2.13 01/24/2022   HGBA1C 5.7 05/20/2020         Assessment & Plan:   Physical exam: Screening blood work  ordered Exercise  walking less now - will get more to it Weight  is good Substance abuse  none   Reviewed recommended immunizations.   Health Maintenance  Topic Date Due   COVID-19 Vaccine (5 - 2024-25 season) 09/10/2022   INFLUENZA VACCINE  08/10/2023   Medicare Annual Wellness (AWV)  04/23/2024   Colonoscopy  07/07/2024   DEXA SCAN  01/24/2025   MAMMOGRAM  01/28/2025   DTaP/Tdap/Td (2 - Td or Tdap) 01/30/2026   Pneumonia Vaccine 58+ Years old  Completed   Hepatitis C Screening  Completed   Zoster Vaccines- Shingrix  Completed   HPV VACCINES  Aged Out   Meningococcal B Vaccine  Aged Out          See Problem List for Assessment and Plan of chronic medical problems.

## 2023-05-08 NOTE — Patient Instructions (Addendum)
 Prolia  injection given.    Blood work was ordered.       Medications changes include :   None    Return in about 6 months (around 11/08/2023) for follow up.    Health Maintenance, Female Adopting a healthy lifestyle and getting preventive care are important in promoting health and wellness. Ask your health care provider about: The right schedule for you to have regular tests and exams. Things you can do on your own to prevent diseases and keep yourself healthy. What should I know about diet, weight, and exercise? Eat a healthy diet  Eat a diet that includes plenty of vegetables, fruits, low-fat dairy products, and lean protein. Do not eat a lot of foods that are high in solid fats, added sugars, or sodium. Maintain a healthy weight Body mass index (BMI) is used to identify weight problems. It estimates body fat based on height and weight. Your health care provider can help determine your BMI and help you achieve or maintain a healthy weight. Get regular exercise Get regular exercise. This is one of the most important things you can do for your health. Most adults should: Exercise for at least 150 minutes each week. The exercise should increase your heart rate and make you sweat (moderate-intensity exercise). Do strengthening exercises at least twice a week. This is in addition to the moderate-intensity exercise. Spend less time sitting. Even light physical activity can be beneficial. Watch cholesterol and blood lipids Have your blood tested for lipids and cholesterol at 72 years of age, then have this test every 5 years. Have your cholesterol levels checked more often if: Your lipid or cholesterol levels are high. You are older than 72 years of age. You are at high risk for heart disease. What should I know about cancer screening? Depending on your health history and family history, you may need to have cancer screening at various ages. This may include screening  for: Breast cancer. Cervical cancer. Colorectal cancer. Skin cancer. Lung cancer. What should I know about heart disease, diabetes, and high blood pressure? Blood pressure and heart disease High blood pressure causes heart disease and increases the risk of stroke. This is more likely to develop in people who have high blood pressure readings or are overweight. Have your blood pressure checked: Every 3-5 years if you are 67-97 years of age. Every year if you are 68 years old or older. Diabetes Have regular diabetes screenings. This checks your fasting blood sugar level. Have the screening done: Once every three years after age 35 if you are at a normal weight and have a low risk for diabetes. More often and at a younger age if you are overweight or have a high risk for diabetes. What should I know about preventing infection? Hepatitis B If you have a higher risk for hepatitis B, you should be screened for this virus. Talk with your health care provider to find out if you are at risk for hepatitis B infection. Hepatitis C Testing is recommended for: Everyone born from [birth year] through 1964. Anyone with known risk factors for hepatitis C. Sexually transmitted infections (STIs) Get screened for STIs, including gonorrhea and chlamydia, if: You are sexually active and are younger than 72 years of age. You are older than 72 years of age and your health care provider tells you that you are at risk for this type of infection. Your sexual activity has changed since you were last screened, and you are at increased risk for  chlamydia or gonorrhea. Ask your health care provider if you are at risk. Ask your health care provider about whether you are at high risk for HIV. Your health care provider may recommend a prescription medicine to help prevent HIV infection. If you choose to take medicine to prevent HIV, you should first get tested for HIV. You should then be tested every 3 months for as long as you  are taking the medicine. Pregnancy If you are about to stop having your period (premenopausal) and you may become pregnant, seek counseling before you get pregnant. Take 400 to 800 micrograms (mcg) of folic acid every day if you become pregnant. Ask for birth control (contraception) if you want to prevent pregnancy. Osteoporosis and menopause Osteoporosis is a disease in which the bones lose minerals and strength with aging. This can result in bone fractures. If you are 26 years old or older, or if you are at risk for osteoporosis and fractures, ask your health care provider if you should: Be screened for bone loss. Take a calcium or vitamin D  supplement to lower your risk of fractures. Be given hormone replacement therapy (HRT) to treat symptoms of menopause. Follow these instructions at home: Alcohol use Do not drink alcohol if: Your health care provider tells you not to drink. You are pregnant, may be pregnant, or are planning to become pregnant. If you drink alcohol: Limit how much you have to: 0-1 drink a day. Know how much alcohol is in your drink. In the [place] one drink equals one 12 oz bottle of beer (355 mL), one 5 oz glass of wine (148 mL), or one 1 oz glass of hard liquor (44 mL). Lifestyle Do not use any products that contain nicotine or tobacco. These products include cigarettes, chewing tobacco, and vaping devices, such as e-cigarettes. If you need help quitting, ask your health care provider. Do not use street drugs. Do not share needles. Ask your health care provider for help if you need support or information about quitting drugs. General instructions Schedule regular health, dental, and eye exams. Stay current with your vaccines. Tell your health care provider if: You often feel depressed. You have ever been abused or do not feel safe at home. Summary Adopting a healthy lifestyle and getting preventive care are important in promoting health and wellness. Follow your  health care provider's instructions about healthy diet, exercising, and getting tested or screened for diseases. Follow your health care provider's instructions on monitoring your cholesterol and blood pressure. This information is not intended to replace advice given to you by your health care provider. Make sure you discuss any questions you have with your health care provider. Document Revised: 05/17/2020 Document Reviewed: 05/17/2020 Elsevier Patient Education  2024 [employer].

## 2023-05-09 ENCOUNTER — Ambulatory Visit: Admitting: Internal Medicine

## 2023-05-09 VITALS — BP 126/80 | HR 71 | Temp 98.0°F | Ht 65.0 in | Wt 157.0 lb

## 2023-05-09 DIAGNOSIS — F419 Anxiety disorder, unspecified: Secondary | ICD-10-CM | POA: Diagnosis not present

## 2023-05-09 DIAGNOSIS — K219 Gastro-esophageal reflux disease without esophagitis: Secondary | ICD-10-CM

## 2023-05-09 DIAGNOSIS — F32A Depression, unspecified: Secondary | ICD-10-CM

## 2023-05-09 DIAGNOSIS — M810 Age-related osteoporosis without current pathological fracture: Secondary | ICD-10-CM | POA: Diagnosis not present

## 2023-05-09 DIAGNOSIS — E890 Postprocedural hypothyroidism: Secondary | ICD-10-CM | POA: Diagnosis not present

## 2023-05-09 DIAGNOSIS — R7303 Prediabetes: Secondary | ICD-10-CM | POA: Diagnosis not present

## 2023-05-09 DIAGNOSIS — E559 Vitamin D deficiency, unspecified: Secondary | ICD-10-CM

## 2023-05-09 DIAGNOSIS — E7849 Other hyperlipidemia: Secondary | ICD-10-CM | POA: Diagnosis not present

## 2023-05-09 DIAGNOSIS — I10 Essential (primary) hypertension: Secondary | ICD-10-CM | POA: Diagnosis not present

## 2023-05-09 DIAGNOSIS — Z0000 Encounter for general adult medical examination without abnormal findings: Secondary | ICD-10-CM

## 2023-05-09 LAB — COMPREHENSIVE METABOLIC PANEL WITH GFR
ALT: 21 U/L (ref 0–35)
AST: 23 U/L (ref 0–37)
Albumin: 4.6 g/dL (ref 3.5–5.2)
Alkaline Phosphatase: 54 U/L (ref 39–117)
BUN: 13 mg/dL (ref 6–23)
CO2: 32 meq/L (ref 19–32)
Calcium: 9.7 mg/dL (ref 8.4–10.5)
Chloride: 101 meq/L (ref 96–112)
Creatinine, Ser: 0.6 mg/dL (ref 0.40–1.20)
GFR: 90.23 mL/min (ref >60.00)
Glucose, Bld: 86 mg/dL (ref 70–99)
Potassium: 5.2 meq/L — ABNORMAL HIGH (ref 3.5–5.1)
Sodium: 137 meq/L (ref 135–145)
Total Bilirubin: 0.6 mg/dL (ref 0.2–1.2)
Total Protein: 7.3 g/dL (ref 6.0–8.3)

## 2023-05-09 LAB — CBC WITH DIFFERENTIAL/PLATELET
Basophils Absolute: 0 10*3/uL (ref 0.0–0.1)
Basophils Relative: 0.6 % (ref 0.0–3.0)
Eosinophils Absolute: 0.1 10*3/uL (ref 0.0–0.7)
Eosinophils Relative: 2.5 % (ref 0.0–5.0)
HCT: 41.3 % (ref 36.0–46.0)
Hemoglobin: 13.9 g/dL (ref 12.0–15.0)
Lymphocytes Relative: 31.2 % (ref 12.0–46.0)
Lymphs Abs: 1.5 10*3/uL (ref 0.7–4.0)
MCHC: 33.6 g/dL (ref 30.0–36.0)
MCV: 96.7 fl (ref 78.0–100.0)
Monocytes Absolute: 0.4 10*3/uL (ref 0.1–1.0)
Monocytes Relative: 8.9 % (ref 3.0–12.0)
Neutro Abs: 2.8 10*3/uL (ref 1.4–7.7)
Neutrophils Relative %: 56.8 % (ref 43.0–77.0)
Platelets: 303 10*3/uL (ref 150.0–400.0)
RBC: 4.27 Mil/uL (ref 3.87–5.11)
RDW: 13.1 % (ref 11.5–15.5)
WBC: 4.9 10*3/uL (ref 4.0–10.5)

## 2023-05-09 LAB — VITAMIN D 25 HYDROXY (VIT D DEFICIENCY, FRACTURES): VITD: 38.42 ng/mL (ref 30.00–100.00)

## 2023-05-09 LAB — LIPID PANEL
Cholesterol: 125 mg/dL (ref 0–200)
HDL: 35.9 mg/dL — ABNORMAL LOW (ref >39.00)
LDL Cholesterol: 66 mg/dL (ref 0–99)
NonHDL: 88.99
Total CHOL/HDL Ratio: 3
Triglycerides: 114 mg/dL (ref 0.0–149.0)
VLDL: 22.8 mg/dL (ref 0.0–40.0)

## 2023-05-09 LAB — TSH: TSH: 3.78 u[IU]/mL (ref 0.35–5.50)

## 2023-05-09 LAB — HEMOGLOBIN A1C: Hgb A1c MFr Bld: 5.6 % (ref 4.6–6.5)

## 2023-05-09 MED ORDER — ESCITALOPRAM OXALATE 20 MG PO TABS: 20.0000 mg | ORAL_TABLET | Freq: Every day | ORAL | 1 refills | Status: DC

## 2023-05-09 MED ORDER — MELOXICAM 15 MG PO TABS: 15.0000 mg | ORAL_TABLET | Freq: Every day | ORAL | 1 refills | Status: AC | PRN

## 2023-05-09 MED ORDER — DENOSUMAB 60 MG/ML ~~LOC~~ SOSY
60.0000 mg | PREFILLED_SYRINGE | Freq: Once | SUBCUTANEOUS | Status: AC
  Administered 2023-05-09: 60 mg via SUBCUTANEOUS

## 2023-05-09 MED ORDER — METHOCARBAMOL 500 MG PO TABS: ORAL_TABLET | ORAL | 3 refills | Status: AC

## 2023-05-09 NOTE — Assessment & Plan Note (Signed)
 Chronic Taking vitamin D daily Check vitamin D level

## 2023-05-09 NOTE — Assessment & Plan Note (Signed)
 Chronic DEXA up-to-date Continue Fosamax  for 5 years-started 2021-stopped 2024 Started Prolia  10/09/2022 Continue Prolia  every 6 months Continue calcium and vitamin D  supplementation Continue regular exercise Check vitamin D  level

## 2023-05-09 NOTE — Assessment & Plan Note (Addendum)
 Chronic GERD controlled Continue omeprazole  40 mg prn - takes 1-2 times a month

## 2023-05-09 NOTE — Assessment & Plan Note (Signed)
 Chronic Regular exercise and healthy diet encouraged Check lipid panel, CMP, CBC, TSH Continue pravastatin  40 mg daily

## 2023-05-09 NOTE — Assessment & Plan Note (Addendum)
 Chronic Controlled, Stable Continue Lexapro  20 mg daily - can try 10 mg daily

## 2023-05-09 NOTE — Assessment & Plan Note (Signed)
 Chronic Blood pressure well controlled No longer on medication Advised monitoring BP at home make sure that it remains well-controlled

## 2023-05-09 NOTE — Assessment & Plan Note (Signed)
 Chronic  Clinically euthyroid Check tsh and will titrate med dose if needed Currently taking levothyroxine  125 mcg 5 days a week, 60.25 mcg 2 days a week

## 2023-05-09 NOTE — Assessment & Plan Note (Signed)
 Chronic Lab Results  Component Value Date   HGBA1C 5.7 05/20/2020   Check a1c Low sugar / carb diet Stressed regular exercise

## 2023-05-10 ENCOUNTER — Encounter: Payer: Self-pay | Admitting: Internal Medicine

## 2023-05-17 ENCOUNTER — Other Ambulatory Visit (INDEPENDENT_AMBULATORY_CARE_PROVIDER_SITE_OTHER): Payer: Self-pay

## 2023-05-17 ENCOUNTER — Ambulatory Visit: Admitting: Orthopedic Surgery

## 2023-05-17 ENCOUNTER — Other Ambulatory Visit: Payer: Self-pay

## 2023-05-17 DIAGNOSIS — M1612 Unilateral primary osteoarthritis, left hip: Secondary | ICD-10-CM | POA: Diagnosis not present

## 2023-05-17 DIAGNOSIS — M79605 Pain in left leg: Secondary | ICD-10-CM | POA: Diagnosis not present

## 2023-05-17 DIAGNOSIS — M25551 Pain in right hip: Secondary | ICD-10-CM

## 2023-05-18 ENCOUNTER — Encounter: Payer: Self-pay | Admitting: Orthopedic Surgery

## 2023-05-18 DIAGNOSIS — M1612 Unilateral primary osteoarthritis, left hip: Secondary | ICD-10-CM | POA: Diagnosis not present

## 2023-05-18 DIAGNOSIS — M25551 Pain in right hip: Secondary | ICD-10-CM | POA: Diagnosis not present

## 2023-05-18 DIAGNOSIS — M79605 Pain in left leg: Secondary | ICD-10-CM | POA: Diagnosis not present

## 2023-05-18 NOTE — Progress Notes (Unsigned)
 Office Visit Note   Patient: Holly Cochran           Date of Birth: 20-Dec-1950           MRN: [id number] Visit Date: 05/17/2023 Requested by: Colene Dauphin, MD [address] [place],  [place] [address] PCP: Colene Dauphin, MD  Subjective: Chief Complaint  Patient presents with   Left Leg - Pain   Right Hip - Pain    HPI: Holly Cochran is a 73 y.o. female who presents to the office reporting left leg and thigh pain.  Does report groin pain on the left-hand side as well.  Had prior right greater trochanteric injection 04/14/2023 with good relief for 3 weeks.  Pain in the left leg has been going on for 3 weeks.  She also reports some low back pain on that side.  Also right buttock pain.  She has a known dropfoot on the left-hand side..                ROS: All systems reviewed are negative as they relate to the chief complaint within the history of present illness.  Patient denies fevers or chills.  Assessment & Plan: Visit Diagnoses:  1. Pain in left leg   2. Pain in right hip     Plan: Impression is left groin pain and leg pain which looks to be arthritis in the face of prior intramedullary hip screw for hip fracture.  Does not look like trochanteric bursitis on the left.  Ultrasound-guided injection into the joint performed today.  Physical therapy indicated for right hip trochanteric bursitis primarily for stretching and modalities 1-2 times a week for 1 to 4 weeks plus a home exercise program.  Could consider 1 more final trochanteric injection if therapy is not effective.  Follow-Up Instructions: No follow-ups on file.   Orders:  Orders Placed This Encounter  Procedures   XR FEMUR MIN 2 VIEWS LEFT   XR Lumbar Spine 2-3 Views   US  Guided Needle Placement - No Linked Charges   Ambulatory referral to Physical Therapy   No orders of the defined types were placed in this encounter.     Procedures: Large Joint Inj: L hip joint on 05/18/2023 11:13 PM Indications: pain  and diagnostic evaluation Details: 22 G 3.5 in needle, ultrasound-guided anterior approach  Arthrogram: No  Medications: 5 mL lidocaine  1 %; 80 mg methylPREDNISolone  acetate 80 MG/ML; 4 mL bupivacaine  0.25 % Outcome: tolerated well, no immediate complications Procedure, treatment alternatives, risks and benefits explained, specific risks discussed. Consent was given by the patient. Immediately prior to procedure a time out was called to verify the correct patient, procedure, equipment, support staff and site/side marked as required. Patient was prepped and draped in the usual sterile fashion.       Clinical Data: No additional findings.  Objective: Vital Signs: LMP 01/09/2005   Physical Exam:  Constitutional: Patient appears well-developed HEENT:  Head: Normocephalic Eyes:EOM are normal Neck: Normal range of motion Cardiovascular: Normal rate Pulmonary/chest: Effort normal Neurologic: Patient is alert Skin: Skin is warm Psychiatric: Patient has normal mood and affect  Ortho Exam: Ortho exam demonstrates mild groin pain on the left with internal/external rotation of the leg.  Trochanteric tenderness is present worse on the right-hand side.  Hip flexion abduction adduction strength is intact.  No nerve root tension signs.  Pedal pulses palpable.  Ankle dorsiflexion intact.  No paresthesias L1-S1 bilaterally.  Specialty Comments:  No specialty  comments available.  Imaging: No results found.   PMFS History: Patient Active Problem List   Diagnosis Date Noted   Prediabetes 05/08/2023   Mid back pain on right side 12/14/2022   Chronic sinus infection 06/26/2022   Chronic upper back pain 01/17/2021   Left upper arm pain 01/17/2021   TMJ arthralgia 04/14/2019   Essential hypertension 03/15/2019   Intertrochanteric fracture of left femur (HCC) 11/18/2017   Vitamin D  deficiency 08/31/2016   Family history of diabetes mellitus in mother 01/30/2016   Anxiety and depression  03/02/2015   Chronically dry eyes 07/20/2014   Common peroneal neuropathy of left lower extremity 05/20/2014   GERD (gastroesophageal reflux disease) 02/08/2011   RHINITIS 11/24/2008   GANGLION OF TENDON SHEATH 11/10/2008   Hyperlipidemia 08/27/2007   Osteoporosis 08/27/2007   Hypothyroidism, postradioiodine therapy 06/01/2006   Past Medical History:  Diagnosis Date   Common peroneal neuropathy of left lower extremity 05/20/2014   FASCIITIS, PLANTAR 10/14/2007   Qualifier: Diagnosis of  By: Donnice Gale MD, Sammie Crigler     GERD (gastroesophageal reflux disease)    Hyperlipidemia    Osteopenia 2015   T score -1.1   Thyroid  disease    hyperthyroid-RA I    Family History  Problem Relation Age of Onset   Diabetes Mother    Kidney disease Mother    Hypertension Mother    Cancer Father        Pancreatic   Breast cancer Paternal Aunt        Age 56's   Cancer Maternal Uncle        melanoma   Diabetes Maternal Uncle    Colon cancer Neg Hx    Esophageal cancer Neg Hx    Stomach cancer Neg Hx    Rectal cancer Neg Hx     Past Surgical History:  Procedure Laterality Date   COLONOSCOPY  2005    Dr Adan Holms, negative   INTRAMEDULLARY (IM) NAIL INTERTROCHANTERIC Left 11/18/2017   Procedure: INTRAMEDULLARY (IM) NAIL INTERTROCHANTRIC;  Surgeon: Neil Balls, MD;  Location: [facility];  Service: Orthopedics;  Laterality: Left;   PLANTAR FASCIA SURGERY  08/2010   TONSILLECTOMY     Social History   Occupational History   Not on file  Tobacco Use   Smoking status: Never    Passive exposure: Never   Smokeless tobacco: Never  Vaping Use   Vaping status: Not on file  Substance and Sexual Activity   Alcohol use: Yes    Alcohol/week: 1.0 standard drink of alcohol    Types: 1 Standard drinks or equivalent per week    Comment: occasionally   Drug use: No   Sexual activity: Never    Birth control/protection: Abstinence, Post-menopausal    Comment: Virgin

## 2023-05-20 MED ORDER — METHYLPREDNISOLONE ACETATE 80 MG/ML IJ SUSP
80.0000 mg | INTRAMUSCULAR | Status: AC | PRN
  Administered 2023-05-18: 80 mg via INTRA_ARTICULAR

## 2023-05-20 MED ORDER — BUPIVACAINE HCL 0.25 % IJ SOLN
4.0000 mL | INTRAMUSCULAR | Status: AC | PRN
Start: 2023-05-18 — End: 2023-05-18
  Administered 2023-05-18: 4 mL via INTRA_ARTICULAR

## 2023-05-20 MED ORDER — LIDOCAINE HCL 1 % IJ SOLN
5.0000 mL | INTRAMUSCULAR | Status: AC | PRN
  Administered 2023-05-18: 5 mL
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
## 2023-05-31 ENCOUNTER — Ambulatory Visit: Admitting: Physical Therapy

## 2023-05-31 ENCOUNTER — Encounter: Payer: Self-pay | Admitting: Physical Therapy

## 2023-05-31 DIAGNOSIS — M6281 Muscle weakness (generalized): Secondary | ICD-10-CM | POA: Diagnosis not present

## 2023-05-31 DIAGNOSIS — R2689 Other abnormalities of gait and mobility: Secondary | ICD-10-CM | POA: Diagnosis not present

## 2023-05-31 DIAGNOSIS — M25551 Pain in right hip: Secondary | ICD-10-CM | POA: Diagnosis not present

## 2023-05-31 DIAGNOSIS — M21372 Foot drop, left foot: Secondary | ICD-10-CM | POA: Diagnosis not present

## 2023-05-31 DIAGNOSIS — M25552 Pain in left hip: Secondary | ICD-10-CM | POA: Diagnosis not present

## 2023-06-05 ENCOUNTER — Ambulatory Visit: Admitting: Physical Therapy

## 2023-06-05 ENCOUNTER — Encounter: Payer: Self-pay | Admitting: Physical Therapy

## 2023-06-05 DIAGNOSIS — M6281 Muscle weakness (generalized): Secondary | ICD-10-CM | POA: Diagnosis not present

## 2023-06-05 DIAGNOSIS — M21372 Foot drop, left foot: Secondary | ICD-10-CM

## 2023-06-05 DIAGNOSIS — R2689 Other abnormalities of gait and mobility: Secondary | ICD-10-CM

## 2023-06-05 DIAGNOSIS — M25551 Pain in right hip: Secondary | ICD-10-CM | POA: Diagnosis not present

## 2023-06-05 DIAGNOSIS — M25552 Pain in left hip: Secondary | ICD-10-CM

## 2023-06-05 NOTE — Therapy (Signed)
 OUTPATIENT PHYSICAL THERAPY LOWER EXTREMITY TREATMENT   Patient Name: Holly Cochran MRN: [id number] DOB:03/09/1950, 73 y.o., female Today's Date: 06/05/2023  END OF SESSION:  PT End of Session - 06/05/23 1025     Visit Number 2    Number of Visits 20    Date for PT Re-Evaluation 08/09/23    Authorization Type [employer]    Authorization Time Period $20 copay  Authorization #[id number]  Tracking #[id number]  10 PT Visits  05/31/2023-0731/2025    Authorization - Visit Number 2    Authorization - Number of Visits 10    Progress Note Due on Visit 10    PT Start Time 1018    PT Stop Time 1100    PT Time Calculation (min) 42 min    Activity Tolerance Patient tolerated treatment well;Patient limited by pain    Behavior During Therapy [facility] for tasks assessed/performed              Past Medical History:  Diagnosis Date   Common peroneal neuropathy of left lower extremity 05/20/2014   FASCIITIS, PLANTAR 10/14/2007   Qualifier: Diagnosis of  By: Donnice Gale MD, Sammie Crigler     GERD (gastroesophageal reflux disease)    Hyperlipidemia    Osteopenia 2015   T score -1.1   Thyroid  disease    hyperthyroid-RA I   Past Surgical History:  Procedure Laterality Date   COLONOSCOPY  2005    Dr Adan Holms, negative   INTRAMEDULLARY (IM) NAIL INTERTROCHANTERIC Left 11/18/2017   Procedure: INTRAMEDULLARY (IM) NAIL INTERTROCHANTRIC;  Surgeon: Neil Balls, MD;  Location: [facility];  Service: Orthopedics;  Laterality: Left;   PLANTAR FASCIA SURGERY  08/2010   TONSILLECTOMY     Patient Active Problem List   Diagnosis Date Noted   Prediabetes 05/08/2023   Mid back pain on right side 12/14/2022   Chronic sinus infection 06/26/2022   Chronic upper back pain 01/17/2021   Left upper arm pain 01/17/2021   TMJ arthralgia 04/14/2019   Essential hypertension 03/15/2019   Intertrochanteric fracture of left femur (HCC) 11/18/2017   Vitamin D  deficiency 08/31/2016   Family history of diabetes mellitus in  mother 01/30/2016   Anxiety and depression 03/02/2015   Chronically dry eyes 07/20/2014   Common peroneal neuropathy of left lower extremity 05/20/2014   GERD (gastroesophageal reflux disease) 02/08/2011   RHINITIS 11/24/2008   GANGLION OF TENDON SHEATH 11/10/2008   Hyperlipidemia 08/27/2007   Osteoporosis 08/27/2007   Hypothyroidism, postradioiodine therapy 06/01/2006    PCP: Colene Dauphin, MD  REFERRING PROVIDER: Jasmine Mesi, MD  REFERRING DIAG:  [id number] (ICD-10-CM) - Pain in left leg  5.551 (ICD-10-CM) - Pain in right hip   THERAPY DIAG:  Pain in right hip  Pain in left hip  Other abnormalities of gait and mobility  Muscle weakness (generalized)  Foot drop, left  Rationale for Evaluation and Treatment: Rehabilitation  ONSET DATE: 05/17/2023 MD referral to PT  SUBJECTIVE:   SUBJECTIVE STATEMENT: She has not tried the pillow in bed yet because was not home.  She has done stretches and tightening Foot-Up orthosis which is helping her hip pain short term.    PERTINENT HISTORY: ORIF left intramedullary nail intertrochanteric 38yrs, LBP, HTN, osteoporosis, common peroneal neuropathy of LLE, plantar fasciitis w/sg 2012,   DIAGNOSTIC FINDINGS:  05/17/2023  X-ray of spine AP lateral radiographs lumbar spine reviewed.  No acute fracture.  No spondylolisthesis.  Mild to moderate facet arthritis present L4-5 and L5-S1.  Disc space reasonably well-maintained.  X-ray of left femur AP lateral radiographs left femur reviewed.  Intramedullary hip screw in good position and alignment with healed fracture.  Moderate arthritis is present in the left hip.  No acute fracture   PAIN:  NPRS scale: right hip at rest  0-3/10 standing & walking up to 7-8/10 and left hip at rest 0-2/10 standing & walking up to 4/10 Pain location: right hip more posterior in buttocks and left hip lateral radiating down to knee Pain description: right hip painful ache at rest & increases with standing  /walking to throbbing;  left hip dull ache at rest and sharper with activity  Aggravating factors: standing & walking Relieving factors: sitting to rest / stop activity, takes arthritis tylenol  2x/day  PRECAUTIONS: None  WEIGHT BEARING RESTRICTIONS: No  FALLS:  Has patient fallen in last 6 months? No  LIVING ENVIRONMENT: Lives with: lives with their family Lives in: [address] with bedroom / bath downstairs;  spare rooms upstairs Stairs: Yes: Internal: 14-15 steps; on right going up and External: 2 steps; can reach both Has following equipment at home: None  OCCUPATION: retired from business  PLOF: Independent  PATIENT GOALS:  to walk & stand without pain  Next MD visit:  as needed  OBJECTIVE:   Patient-Specific Activity Scoring Scheme  "0" represents "unable to perform." "10" represents "able to perform at prior level. 0 1 2 3 4 5 6 7 8 9  10 (Date and Score)   Activity Eval     1.  Standing without pain  5    2. Walking without pain 4     3. Stairs without pain 3   4.    5.    Score 4    Total score = sum of the activity scores/number of activities Minimum detectable change (90%CI) for average score = 2 points Minimum detectable change (90%CI) for single activity score = 3 points  COGNITION: Overall cognitive status: WFL    SENSATION: WFL  EDEMA:  None noted  MUSCLE LENGTH: Hamstrings: bilateral WFL  POSTURE: No Significant postural limitations  PALPATION: Evaluation: tenderness right hip more in buttocks at Piriformis and left ITB from hip to distal knee more at Greater Trochanteric bursa  LOWER EXTREMITY ROM:   ROM Right eval Left eval  Hip flexion 100* 105*  Hip extension 0* 0*  Hip abduction    Hip adduction    Hip internal rotation [facility] WFL  Hip external rotation [facility]  Knee flexion    Knee extension    Ankle dorsiflexion    Ankle plantarflexion    Ankle inversion    Ankle eversion     (Blank rows = not tested)  LOWER  EXTREMITY MMT:  MMT Right eval Left eval  Hip flexion    Hip extension 4/5 4/5  Hip abduction 4/5 4/5  Hip adduction    Hip internal rotation    Hip external rotation    Knee flexion    Knee extension    Ankle dorsiflexion    Ankle plantarflexion    Ankle inversion    Ankle eversion     (Blank rows = not tested)  FUNCTIONAL TESTS:  18 inch chair transfer: sit to/from  stand without UE assist   GAIT: Distance walked: 200' Assistive device utilized: None except LLE Foot-Up orthosis Level of assistance: SBA / verbal cues for technique - no balance issue noted Comments: patient ambulates with steppage gait due to left foot drop, increased lateral trunk lean to right  with RLE stance, LLE increased hip/knee flexion with intermittent circumduction in swing. Gait Velocity: self-selected pace 3.14 ft/sec and fast pace 3.95 ft/sec   TODAY'S TREATMENT                                                                          DATE: 06/05/2023 Therapeutic Exercise: - Seated Piriformis Stretch with Trunk Bend  - BLEs 1 reps - 30 seconds hold - Seated Piriformis Stretch switched to this Piriformis stretch BLEs 3 reps - 30 seconds hold - Supine Hamstring Stretch with Strap BLEs 2 reps - 30 seconds hold - Supine ITB Stretch with Strap BLEs 3 reps - 30 seconds hold - Supine Piriformis Stretch  BLEs 2 reps - 30 seconds hold - Clamshell  2 sets - 10 reps - 5 seconds hold PT cued on technique.   PT updated HEP with HO, tactile & verbal cues.  Pt verbalized understanding.   Orthotic Training: PT trial of left Ypsilon carbon AFO.  Pt had improved foot clearance.  PT is recommending using AFO over Foot-Up orthosis on uneven terrains and longer walks for now.  She can use Foot-Up for household & basic community with limited distances and mainly all firm surfaces.  PT explained that we need to try to use it to determine if controlling foot drop better has an impact on her hip pain.  Pt verbalizes  understanding and agrees to try AFO.     TREATMENT                                                                          DATE: 05/31/2023 Therapeutic Exercise:  HEP instruction/performance c cues for techniques, handout provided.  Trial set performed of each for comprehension and symptom assessment.  See below for exercise list  Self-Care: PT demo & verbal cues on to don Foot-Up orthosis to provide increased DF assistance. Pt reports normally a side sleeper but not been able to lay on either side in a while.  PT demo & verbal cues on using pillows for position in side lying and "tenting" sheets off her feet.  Pt verbalized understanding.   PATIENT EDUCATION:  Education details: HEP, POC Person educated: Patient Education method: Programmer, multimedia, Demonstration, Verbal cues, and Handouts Education comprehension: verbalized understanding, returned demonstration, and verbal cues required  HOME EXERCISE PROGRAM: Access Code: [email or web address] URL: [email or web address] Date: 06/05/2023 Prepared by: Lorie Rook  Exercises - Seated Piriformis Stretch with Trunk Bend  - 1-2 x daily - 7 x weekly - 1 sets - 3 reps - 30 seconds hold - Seated Piriformis Stretch  - 1 x daily - 5 x weekly - 1 sets - 10 reps - 5 seconds hold - Supine ITB Stretch with Strap  - 1-2 x daily - 7 x weekly - 1 sets - 3 reps - 30 seconds hold - Supine Hamstring Stretch with Strap  - 1-2 x daily - 7 x weekly - 1 sets - 3  reps - 30 seconds hold - Supine Piriformis Stretch  - 1-2 x daily - 7 x weekly - 1 sets - 3 reps - 30 seconds hold - Clamshell  - 1 x daily - 7 x weekly - 2 sets - 10 reps - 5 seconds hold  ASSESSMENT: CLINICAL IMPRESSION: PT progressed HEP which she appears to understand.  PT also is trial of AFO to assess if use improves hip pain.    OBJECTIVE IMPAIRMENTS: Abnormal gait, decreased activity tolerance, decreased balance, decreased endurance, decreased knowledge of condition, decreased knowledge of  use of DME, decreased mobility, decreased ROM, decreased strength, pain, and orthotic dependency.   ACTIVITY LIMITATIONS: carrying, standing, squatting, sleeping, stairs, and locomotion level  PARTICIPATION LIMITATIONS: community activity and yard work  PERSONAL FACTORS: Time since onset of injury/illness/exacerbation and 3+ comorbidities: see PMH are also affecting patient's functional outcome.   REHAB POTENTIAL: Excellent  CLINICAL DECISION MAKING: Stable/uncomplicated  EVALUATION COMPLEXITY: Low   GOALS: Goals reviewed with patient? Yes  SHORT TERM GOALS: (target date for Short term goals 06/29/2023)   1.  Patient will demonstrate independent use of home exercise program to maintain progress from in clinic treatments. Baseline: See objective data Goal status: Ongoing   06/05/2023    2. Patient reports 50% improvement in pain in both hips. Baseline: See objective data Goal status: Ongoing   06/05/2023    3. Patient verbalizes understanding of AFO / orthotic recommendations.  Baseline: See objective data Goal status: Ongoing   06/05/2023    LONG TERM GOALS: (target dates for all long term goals  08/09/2023 )   1. Patient will demonstrate/report pain at worst less than or equal to 2/10 to facilitate minimal limitation in daily activity secondary to pain symptoms. Baseline: See objective data Goal status: Ongoing   06/05/2023     2. Patient will demonstrate independent use of home exercise program to facilitate ability to maintain/progress functional gains from skilled physical therapy services. Baseline: See objective data Goal status: Ongoing   06/05/2023    3.  Patient reports Patient-Specific Activity Score improved the average >/= 7 to indicate improvement in functional activities.  Baseline: SEE OBJECTIVE DATA Goal status: Ongoing   06/05/2023     4.  Patient will demonstrate bilateral hip LE MMT 5/5 throughout to faciltiate usual transfers, stairs, squatting at [facility] for  daily life.  Baseline: See objective data Goal status: Ongoing   06/05/2023     5.  Patient verbalizes understanding of orthotic recommendations to manage her long term left foot drop.  Baseline: See objective data Goal status: Ongoing   06/05/2023     6.  pt reports able to ambulate >1 mile, negotiate ramps, curbs & stairs with single rail.   Baseline: See objective data Goal status:  Ongoing   06/05/2023     PLAN:  PT FREQUENCY:  1-2x/week (will start with 1x/wk and increase if needed)  PT DURATION: 10 weeks  PLANNED INTERVENTIONS: 97164- PT Re-evaluation, 97110-Therapeutic exercises, 97530- Therapeutic activity, [id number]- Neuromuscular re-education, 97535- Self Care, 96045- Manual therapy, [id number]- Gait training, [phone]- Orthotic Initial, [phone]- Orthotic/Prosthetic subsequent, [phone]- Ionotophoresis 4mg /ml Dexamethasone , Patient/Family education, Balance training, Stair training, Taping, Dry Needling, Joint mobilization, DME instructions, Cryotherapy, and Moist heat  PLAN FOR NEXT SESSION:   continue to Review & Update HEP.  Check on Ypsilon AFO.  Pain management manual therapy.    Lorie Rook, PT, DPT 06/05/2023, 2:25 PM   Referring diagnosis? M79.605 (ICD-10-CM) - Pain in left leg  5.551 (  ICD-10-CM) - Pain in right hip  Treatment diagnosis? (if different than referring diagnosis)  Pain in right hip  M25.551  Pain in left hip M25.552  Other abnormalities of gait and mobility R26.89  Muscle weakness (generalized)  M62.81  Foot drop, left M21.372  What was this (referring dx) caused by? []  Surgery []  Fall []  Ongoing issue [x]  Arthritis []  Other: ____________  Laterality: []  Rt []  Lt [x]  Both  Check all possible CPT codes:  *CHOOSE 10 OR LESS*    See Planned Interventions listed in the Plan section of the Evaluation.

## 2023-06-19 ENCOUNTER — Encounter: Payer: Self-pay | Admitting: Physical Therapy

## 2023-06-19 ENCOUNTER — Telehealth: Payer: Self-pay | Admitting: Physical Therapy

## 2023-06-19 ENCOUNTER — Ambulatory Visit: Payer: Self-pay | Admitting: Physical Therapy

## 2023-06-19 DIAGNOSIS — M25552 Pain in left hip: Secondary | ICD-10-CM

## 2023-06-19 DIAGNOSIS — M6281 Muscle weakness (generalized): Secondary | ICD-10-CM | POA: Diagnosis not present

## 2023-06-19 DIAGNOSIS — R2689 Other abnormalities of gait and mobility: Secondary | ICD-10-CM | POA: Diagnosis not present

## 2023-06-19 DIAGNOSIS — M25551 Pain in right hip: Secondary | ICD-10-CM | POA: Diagnosis not present

## 2023-06-19 DIAGNOSIS — M21372 Foot drop, left foot: Secondary | ICD-10-CM | POA: Diagnosis not present

## 2023-06-19 NOTE — Telephone Encounter (Signed)
 Ms. Pollina needs an orthotic consult for an AFO at [facility].  In order for [facility] to work on her case, they need an order.  Can you please send an order for an AFO to [facility] attn Amy W.? [facility] Elenore Griffon is [phone] Let me know if you have questions Lorie Rook, PT, DPT

## 2023-06-19 NOTE — Therapy (Signed)
 OUTPATIENT PHYSICAL THERAPY LOWER EXTREMITY TREATMENT   Patient Name: Holly Cochran MRN: [id number] DOB:1950-10-16, 73 y.o., female Today's Date: 06/19/2023  END OF SESSION:  PT End of Session - 06/19/23 0759     Visit Number 3    Number of Visits 20    Date for PT Re-Evaluation 08/09/23    Authorization Type [employer]    Authorization Time Period $20 copay  Authorization #[id number]  Tracking #[id number]  10 PT Visits  05/31/2023-0731/2025    Authorization - Visit Number 3    Authorization - Number of Visits 10    Progress Note Due on Visit 10    PT Start Time 0759    PT Stop Time 0840    PT Time Calculation (min) 41 min    Activity Tolerance Patient tolerated treatment well;Patient limited by pain    Behavior During Therapy [facility] for tasks assessed/performed               Past Medical History:  Diagnosis Date   Common peroneal neuropathy of left lower extremity 05/20/2014   FASCIITIS, PLANTAR 10/14/2007   Qualifier: Diagnosis of  By: Donnice Gale MD, Sammie Crigler     GERD (gastroesophageal reflux disease)    Hyperlipidemia    Osteopenia 2015   T score -1.1   Thyroid  disease    hyperthyroid-RA I   Past Surgical History:  Procedure Laterality Date   COLONOSCOPY  2005    Dr Adan Holms, negative   INTRAMEDULLARY (IM) NAIL INTERTROCHANTERIC Left 11/18/2017   Procedure: INTRAMEDULLARY (IM) NAIL INTERTROCHANTRIC;  Surgeon: Neil Balls, MD;  Location: [facility];  Service: Orthopedics;  Laterality: Left;   PLANTAR FASCIA SURGERY  08/2010   TONSILLECTOMY     Patient Active Problem List   Diagnosis Date Noted   Prediabetes 05/08/2023   Mid back pain on right side 12/14/2022   Chronic sinus infection 06/26/2022   Chronic upper back pain 01/17/2021   Left upper arm pain 01/17/2021   TMJ arthralgia 04/14/2019   Essential hypertension 03/15/2019   Intertrochanteric fracture of left femur (HCC) 11/18/2017   Vitamin D  deficiency 08/31/2016   Family history of diabetes mellitus  in mother 01/30/2016   Anxiety and depression 03/02/2015   Chronically dry eyes 07/20/2014   Common peroneal neuropathy of left lower extremity 05/20/2014   GERD (gastroesophageal reflux disease) 02/08/2011   RHINITIS 11/24/2008   GANGLION OF TENDON SHEATH 11/10/2008   Hyperlipidemia 08/27/2007   Osteoporosis 08/27/2007   Hypothyroidism, postradioiodine therapy 06/01/2006    PCP: Colene Dauphin, MD  REFERRING PROVIDER: Jasmine Mesi, MD  REFERRING DIAG:  [phone] (ICD-10-CM) - Pain in left leg  5.551 (ICD-10-CM) - Pain in right hip   THERAPY DIAG:  Pain in right hip  Pain in left hip  Other abnormalities of gait and mobility  Muscle weakness (generalized)  Foot drop, left  Rationale for Evaluation and Treatment: Rehabilitation  ONSET DATE: 05/17/2023 MD referral to PT  SUBJECTIVE:   SUBJECTIVE STATEMENT: She got out her old AFO and used in [place] for 3 days.  Her hip pain is lower when using the AFO  PERTINENT HISTORY: ORIF left intramedullary nail intertrochanteric 30yrs, LBP, HTN, osteoporosis, common peroneal neuropathy of LLE, plantar fasciitis w/sg 2012,   DIAGNOSTIC FINDINGS:  05/17/2023  X-ray of spine AP lateral radiographs lumbar spine reviewed.  No acute fracture.  No spondylolisthesis.  Mild to moderate facet arthritis present L4-5 and L5-S1.  Disc space reasonably well-maintained.    X-ray of left femur AP  lateral radiographs left femur reviewed.  Intramedullary hip screw in good position and alignment with healed fracture.  Moderate arthritis is present in the left hip.  No acute fracture   PAIN:  NPRS scale: right hip at rest  0-3/10 standing & walking with AFO up to 3/10, with Foot-Up 5/10 and left hip at rest 0-2/10 standing & walking with AFO 0/10 walking with Foot-Up up to 2/10 Pain location: right hip more posterior in buttocks and left hip lateral radiating down to knee Pain description: right hip painful ache at rest & increases with standing  /walking to throbbing;  left hip dull ache at rest and sharper with activity  Aggravating factors: standing & walking Relieving factors: sitting to rest / stop activity, takes arthritis tylenol  2x/day  PRECAUTIONS: None  WEIGHT BEARING RESTRICTIONS: No  FALLS:  Has patient fallen in last 6 months? No  LIVING ENVIRONMENT: Lives with: lives with their family Lives in: [address] with bedroom / bath downstairs;  spare rooms upstairs Stairs: Yes: Internal: 14-15 steps; on right going up and External: 2 steps; can reach both Has following equipment at home: None  OCCUPATION: retired from business  PLOF: Independent  PATIENT GOALS:  to walk & stand without pain  Next MD visit:  as needed  OBJECTIVE:   Patient-Specific Activity Scoring Scheme  "0" represents "unable to perform." "10" represents "able to perform at prior level. 0 1 2 3 4 5 6 7 8 9  10 (Date and Score)   Activity Eval     1.  Standing without pain  5    2. Walking without pain 4     3. Stairs without pain 3   4.    5.    Score 4    Total score = sum of the activity scores/number of activities Minimum detectable change (90%CI) for average score = 2 points Minimum detectable change (90%CI) for single activity score = 3 points  COGNITION: Overall cognitive status: WFL    SENSATION: WFL  EDEMA:  None noted  MUSCLE LENGTH: Hamstrings: bilateral WFL  POSTURE: No Significant postural limitations  PALPATION: Evaluation: tenderness right hip more in buttocks at Piriformis and left ITB from hip to distal knee more at Greater Trochanteric bursa  LOWER EXTREMITY ROM:   ROM Right eval Left eval  Hip flexion 100* 105*  Hip extension 0* 0*  Hip abduction    Hip adduction    Hip internal rotation [facility] WFL  Hip external rotation [facility]  Knee flexion    Knee extension    Ankle dorsiflexion    Ankle plantarflexion    Ankle inversion    Ankle eversion     (Blank rows = not tested)  LOWER  EXTREMITY MMT:  MMT Right eval Left eval  Hip flexion    Hip extension 4/5 4/5  Hip abduction 4/5 4/5  Hip adduction    Hip internal rotation    Hip external rotation    Knee flexion    Knee extension    Ankle dorsiflexion    Ankle plantarflexion    Ankle inversion    Ankle eversion     (Blank rows = not tested)  FUNCTIONAL TESTS:  18 inch chair transfer: sit to/from  stand without UE assist   GAIT: Distance walked: 200' Assistive device utilized: None except LLE Foot-Up orthosis Level of assistance: SBA / verbal cues for technique - no balance issue noted Comments: patient ambulates with steppage gait due to left foot drop,  increased lateral trunk lean to right with RLE stance, LLE increased hip/knee flexion with intermittent circumduction in swing. Gait Velocity: self-selected pace 3.14 ft/sec and fast pace 3.95 ft/sec   TODAY'S TREATMENT                                                                          DATE: 06/19/2023  Orthotic Training: Patient's AFO needs new padding to decrease risk of skin issues.  PT educated that she could use flesh colored sleeve to cover AFO for cosmetics if desired. PT & pt discussed difference in pain with AFO vs Foot-Up orthosis.  Pt wants to look into different model of AFO.  PT requested MD prescription to work with orthotist on options.  Pt verbalized understanding and will call to set up consult appt with Amy W at [facility].   Self-Care: PT demo & verbal cues on position in bed including "tenting" sheets off feet, LE & upper body using pillows.  Pt verbalized understanding.      TREATMENT                                                                          DATE: 06/05/2023 Therapeutic Exercise: - Seated Piriformis Stretch with Trunk Bend  - BLEs 1 reps - 30 seconds hold - Seated Piriformis Stretch switched to this Piriformis stretch BLEs 3 reps - 30 seconds hold - Supine Hamstring Stretch with Strap BLEs 2 reps - 30 seconds  hold - Supine ITB Stretch with Strap BLEs 3 reps - 30 seconds hold - Supine Piriformis Stretch  BLEs 2 reps - 30 seconds hold - Clamshell  2 sets - 10 reps - 5 seconds hold PT cued on technique.   PT updated HEP with HO, tactile & verbal cues.  Pt verbalized understanding.   Orthotic Training: PT trial of left Ypsilon carbon AFO.  Pt had improved foot clearance.  PT is recommending using AFO over Foot-Up orthosis on uneven terrains and longer walks for now.  She can use Foot-Up for household & basic community with limited distances and mainly all firm surfaces.  PT explained that we need to try to use it to determine if controlling foot drop better has an impact on her hip pain.  Pt verbalizes understanding and agrees to try AFO.     TREATMENT                                                                          DATE: 05/31/2023 Therapeutic Exercise:  HEP instruction/performance c cues for techniques, handout provided.  Trial set performed of each for comprehension and symptom assessment.  See below for exercise list  Self-Care: PT demo &  verbal cues on to don Foot-Up orthosis to provide increased DF assistance. Pt reports normally a side sleeper but not been able to lay on either side in a while.  PT demo & verbal cues on using pillows for position in side lying and "tenting" sheets off her feet.  Pt verbalized understanding.   PATIENT EDUCATION:  Education details: HEP, POC Person educated: Patient Education method: Programmer, multimedia, Demonstration, Verbal cues, and Handouts Education comprehension: verbalized understanding, returned demonstration, and verbal cues required  HOME EXERCISE PROGRAM: Access Code: [email or web address] URL: [email or web address] Date: 06/05/2023 Prepared by: Lorie Rook  Exercises - Seated Piriformis Stretch with Trunk Bend  - 1-2 x daily - 7 x weekly - 1 sets - 3 reps - 30 seconds hold - Seated Piriformis Stretch  - 1 x daily - 5 x weekly - 1 sets - 10  reps - 5 seconds hold - Supine ITB Stretch with Strap  - 1-2 x daily - 7 x weekly - 1 sets - 3 reps - 30 seconds hold - Supine Hamstring Stretch with Strap  - 1-2 x daily - 7 x weekly - 1 sets - 3 reps - 30 seconds hold - Supine Piriformis Stretch  - 1-2 x daily - 7 x weekly - 1 sets - 3 reps - 30 seconds hold - Clamshell  - 1 x daily - 7 x weekly - 2 sets - 10 reps - 5 seconds hold  ASSESSMENT: CLINICAL IMPRESSION: AFO use appears to have significantly decreased her hip pain.  She appears to understand position in bed.  OBJECTIVE IMPAIRMENTS: Abnormal gait, decreased activity tolerance, decreased balance, decreased endurance, decreased knowledge of condition, decreased knowledge of use of DME, decreased mobility, decreased ROM, decreased strength, pain, and orthotic dependency.   ACTIVITY LIMITATIONS: carrying, standing, squatting, sleeping, stairs, and locomotion level  PARTICIPATION LIMITATIONS: community activity and yard work  PERSONAL FACTORS: Time since onset of injury/illness/exacerbation and 3+ comorbidities: see PMH are also affecting patient's functional outcome.   REHAB POTENTIAL: Excellent  CLINICAL DECISION MAKING: Stable/uncomplicated  EVALUATION COMPLEXITY: Low   GOALS: Goals reviewed with patient? Yes  SHORT TERM GOALS: (target date for Short term goals 06/29/2023)   1.  Patient will demonstrate independent use of home exercise program to maintain progress from in clinic treatments. Baseline: See objective data Goal status: Ongoing   06/19/2023     2. Patient reports 50% improvement in pain in both hips. Baseline: See objective data Goal status: Ongoing   06/19/2023      3. Patient verbalizes understanding of AFO / orthotic recommendations.  Baseline: See objective data Goal status: Ongoing   06/19/2023      LONG TERM GOALS: (target dates for all long term goals  08/09/2023 )   1. Patient will demonstrate/report pain at worst less than or equal to 2/10 to  facilitate minimal limitation in daily activity secondary to pain symptoms. Baseline: See objective data Goal status: Ongoing   06/19/2023       2. Patient will demonstrate independent use of home exercise program to facilitate ability to maintain/progress functional gains from skilled physical therapy services. Baseline: See objective data Goal status: Ongoing   06/19/2023      3.  Patient reports Patient-Specific Activity Score improved the average >/= 7 to indicate improvement in functional activities.  Baseline: SEE OBJECTIVE DATA Goal status: Ongoing   06/19/2023       4.  Patient will demonstrate bilateral hip LE MMT 5/5 throughout to faciltiate usual transfers, stairs, squatting  at PLOF for daily life.  Baseline: See objective data Goal status: Ongoing   06/19/2023      5.  Patient verbalizes understanding of orthotic recommendations to manage her long term left foot drop.  Baseline: See objective data Goal status: Ongoing   06/19/2023     6.  pt reports able to ambulate >1 mile, negotiate ramps, curbs & stairs with single rail.   Baseline: See objective data Goal status:  Ongoing   06/19/2023     PLAN:  PT FREQUENCY:  1-2x/week (will start with 1x/wk and increase if needed)  PT DURATION: 10 weeks  PLANNED INTERVENTIONS: 97164- PT Re-evaluation, 97110-Therapeutic exercises, 97530- Therapeutic activity, [id number]- Neuromuscular re-education, 97535- Self Care, 81191- Manual therapy, [id number]- Gait training, [phone]- Orthotic Initial, [phone]- Orthotic/Prosthetic subsequent, [phone]- Ionotophoresis 4mg /ml Dexamethasone , Patient/Family education, Balance training, Stair training, Taping, Dry Needling, Joint mobilization, DME instructions, Cryotherapy, and Moist heat  PLAN FOR NEXT SESSION: continue to Review & Update HEP.  Check if she got an appt with orthotist. Pain management manual therapy.    Lorie Rook, PT, DPT 06/19/2023, 10:16 AM   Referring diagnosis? M79.605 (ICD-10-CM) - Pain in  left leg  5.551 (ICD-10-CM) - Pain in right hip  Treatment diagnosis? (if different than referring diagnosis)  Pain in right hip  M25.551  Pain in left hip M25.552  Other abnormalities of gait and mobility R26.89  Muscle weakness (generalized)  M62.81  Foot drop, left M21.372  What was this (referring dx) caused by? []  Surgery []  Fall []  Ongoing issue [x]  Arthritis []  Other: ____________  Laterality: []  Rt []  Lt [x]  Both  Check all possible CPT codes:  *CHOOSE 10 OR LESS*    See Planned Interventions listed in the Plan section of the Evaluation.

## 2023-06-19 NOTE — Telephone Encounter (Signed)
 Hi Lauren can you send over a prescription for the AFO to that fax number thanks

## 2023-06-22 ENCOUNTER — Other Ambulatory Visit: Payer: Self-pay

## 2023-06-22 DIAGNOSIS — M79605 Pain in left leg: Secondary | ICD-10-CM

## 2023-06-22 NOTE — Telephone Encounter (Signed)
 Faxed order

## 2023-06-25 ENCOUNTER — Ambulatory Visit: Admitting: Physical Therapy

## 2023-06-25 ENCOUNTER — Encounter: Payer: Self-pay | Admitting: Physical Therapy

## 2023-06-25 DIAGNOSIS — M21372 Foot drop, left foot: Secondary | ICD-10-CM

## 2023-06-25 DIAGNOSIS — M25551 Pain in right hip: Secondary | ICD-10-CM

## 2023-06-25 DIAGNOSIS — M6281 Muscle weakness (generalized): Secondary | ICD-10-CM

## 2023-06-25 DIAGNOSIS — M25552 Pain in left hip: Secondary | ICD-10-CM | POA: Diagnosis not present

## 2023-06-25 DIAGNOSIS — R2689 Other abnormalities of gait and mobility: Secondary | ICD-10-CM

## 2023-06-25 NOTE — Therapy (Addendum)
 OUTPATIENT PHYSICAL THERAPY LOWER EXTREMITY TREATMENT   Patient Name: Holly Cochran MRN: [id number] DOB:28-Nov-1950, 73 y.o., female Today's Date: 06/25/2023  END OF SESSION:  PT End of Session - 06/25/23 1052     Visit Number 4    Number of Visits 20    Date for PT Re-Evaluation 08/09/23    Authorization Type [employer]    Authorization Time Period $20 copay  Authorization #[id number]  Tracking #[id number]  10 PT Visits  05/31/2023-0731/2025    Authorization - Visit Number 4    Authorization - Number of Visits 10    Progress Note Due on Visit 10    PT Start Time 1052    PT Stop Time 1139    PT Time Calculation (min) 47 min    Activity Tolerance Patient tolerated treatment well;Patient limited by pain    Behavior During Therapy [facility] for tasks assessed/performed             Past Medical History:  Diagnosis Date   Common peroneal neuropathy of left lower extremity 05/20/2014   FASCIITIS, PLANTAR 10/14/2007   Qualifier: Diagnosis of  By: Donnice Gale MD, Sammie Crigler     GERD (gastroesophageal reflux disease)    Hyperlipidemia    Osteopenia 2015   T score -1.1   Thyroid  disease    hyperthyroid-RA I   Past Surgical History:  Procedure Laterality Date   COLONOSCOPY  2005    Dr Adan Holms, negative   INTRAMEDULLARY (IM) NAIL INTERTROCHANTERIC Left 11/18/2017   Procedure: INTRAMEDULLARY (IM) NAIL INTERTROCHANTRIC;  Surgeon: Neil Balls, MD;  Location: [facility];  Service: Orthopedics;  Laterality: Left;   PLANTAR FASCIA SURGERY  08/2010   TONSILLECTOMY     Patient Active Problem List   Diagnosis Date Noted   Prediabetes 05/08/2023   Mid back pain on right side 12/14/2022   Chronic sinus infection 06/26/2022   Chronic upper back pain 01/17/2021   Left upper arm pain 01/17/2021   TMJ arthralgia 04/14/2019   Essential hypertension 03/15/2019   Intertrochanteric fracture of left femur (HCC) 11/18/2017   Vitamin D  deficiency 08/31/2016   Family history of diabetes mellitus in  mother 01/30/2016   Anxiety and depression 03/02/2015   Chronically dry eyes 07/20/2014   Common peroneal neuropathy of left lower extremity 05/20/2014   GERD (gastroesophageal reflux disease) 02/08/2011   RHINITIS 11/24/2008   GANGLION OF TENDON SHEATH 11/10/2008   Hyperlipidemia 08/27/2007   Osteoporosis 08/27/2007   Hypothyroidism, postradioiodine therapy 06/01/2006    PCP: Colene Dauphin, MD  REFERRING PROVIDER: Jasmine Mesi, MD  REFERRING DIAG:  [phone] (ICD-10-CM) - Pain in left leg  5.551 (ICD-10-CM) - Pain in right hip   THERAPY DIAG:  Pain in right hip  Pain in left hip  Other abnormalities of gait and mobility  Muscle weakness (generalized)  Foot drop, left  Rationale for Evaluation and Treatment: Rehabilitation  ONSET DATE: 05/17/2023 MD referral to PT  SUBJECTIVE:   SUBJECTIVE STATEMENT: She has not set up appt with orhotist because waiting on order to go in.  She went to a horse show using Foot-Up Orthosis.  Her right hip / buttocks pain went up.  She did her exercises. She tried some of positioning in bed but did not seem to help.    PERTINENT HISTORY: ORIF left intramedullary nail intertrochanteric 72yrs, LBP, HTN, osteoporosis, common peroneal neuropathy of LLE, plantar fasciitis w/sg 2012,   DIAGNOSTIC FINDINGS:  05/17/2023  X-ray of spine AP lateral radiographs lumbar spine reviewed.  No acute fracture.  No spondylolisthesis.  Mild to moderate facet arthritis present L4-5 and L5-S1.  Disc space reasonably well-maintained.    X-ray of left femur AP lateral radiographs left femur reviewed.  Intramedullary hip screw in good position and alignment with healed fracture.  Moderate arthritis is present in the left hip.  No acute fracture   PAIN:  NPRS scale: right hip at rest 0-3/10 standing & walking with AFO up to 5/10, with Foot-Up 5-6/10 and left hip at rest 0-2/10 standing & walking with AFO 0/10 walking with Foot-Up up to 2/10 Pain location: right  hip more posterior in buttocks and left hip lateral radiating down to knee Pain description: right hip painful ache at rest & increases with standing /walking to throbbing;  left hip dull ache at rest and sharper with activity  Aggravating factors: standing & walking Relieving factors: sitting to rest / stop activity, takes arthritis tylenol  2x/day  PRECAUTIONS: None  WEIGHT BEARING RESTRICTIONS: No  FALLS:  Has patient fallen in last 6 months? No  LIVING ENVIRONMENT: Lives with: lives with their family Lives in: [address] with bedroom / bath downstairs;  spare rooms upstairs Stairs: Yes: Internal: 14-15 steps; on right going up and External: 2 steps; can reach both Has following equipment at home: None  OCCUPATION: retired from business  PLOF: Independent  PATIENT GOALS:  to walk & stand without pain  Next MD visit:  as needed  OBJECTIVE:   Patient-Specific Activity Scoring Scheme  0 represents "unable to perform." 10 represents "able to perform at prior level. 0 1 2 3 4 5 6 7 8 9  10 (Date and Score)   Activity Eval     1.  Standing without pain  5    2. Walking without pain 4     3. Stairs without pain 3   4.    5.    Score 4    Total score = sum of the activity scores/number of activities Minimum detectable change (90%CI) for average score = 2 points Minimum detectable change (90%CI) for single activity score = 3 points  COGNITION: Overall cognitive status: WFL    SENSATION: WFL  EDEMA:  None noted  MUSCLE LENGTH: Hamstrings: bilateral WFL  POSTURE: No Significant postural limitations  PALPATION: Evaluation: tenderness right hip more in buttocks at Piriformis and left ITB from hip to distal knee more at Greater Trochanteric bursa  LOWER EXTREMITY ROM:   ROM Right eval Left eval  Hip flexion 100* 105*  Hip extension 0* 0*  Hip abduction    Hip adduction    Hip internal rotation [facility] WFL  Hip external rotation [facility]  Knee flexion     Knee extension    Ankle dorsiflexion    Ankle plantarflexion    Ankle inversion    Ankle eversion     (Blank rows = not tested)  LOWER EXTREMITY MMT:  MMT Right eval Left eval  Hip flexion    Hip extension 4/5 4/5  Hip abduction 4/5 4/5  Hip adduction    Hip internal rotation    Hip external rotation    Knee flexion    Knee extension    Ankle dorsiflexion    Ankle plantarflexion    Ankle inversion    Ankle eversion     (Blank rows = not tested)  FUNCTIONAL TESTS:  18 inch chair transfer: sit to/from  stand without UE assist   GAIT: Distance walked: 200' Assistive device utilized: None except  LLE Foot-Up orthosis Level of assistance: SBA / verbal cues for technique - no balance issue noted Comments: patient ambulates with steppage gait due to left foot drop, increased lateral trunk lean to right with RLE stance, LLE increased hip/knee flexion with intermittent circumduction in swing. Gait Velocity: self-selected pace 3.14 ft/sec and fast pace 3.95 ft/sec   TODAY'S TREATMENT                                                                          DATE: 06/25/2023 Self-Care: - PT demo & verbal cues on use of tennis ball supine & standing against wall and percussion gun to piriformis muscle. Pt return demo & verbalized understanding.   Orthotic Training: PT reviewed recommendation for using AFO over Foot-Up on uneven terrain, hills or inclined surfaces and longer distances.  She is scheduling appt with orthotist to discuss options for new AFO.  Pt verbalized understanding.  PT reviewed Foot-Up orthosis donning with foot dorsiflexed to optimize use.  Pt verbalized understanding.  PT demo & verbal cues on weight shift so pelvis over stance foot and contralateral LE in terminal stance with heel rise prior to stepping forward.  Worked on inside //bars on weight shift with proper step width.  Progressed to outside of //bars self-selected and fast pace.  Pt verbalized  understanding.   Manual STM with compression to Rt glutes and piriformis; skilled palpation and monitoring of soft tissue during DN  Trigger Point Dry Needling  Initial Treatment: Pt instructed on Dry Needling rational, procedures, and possible side effects. Pt instructed to expect mild to moderate muscle soreness later in the day and/or into the next day.  Pt instructed in methods to reduce muscle soreness. Pt instructed to continue prescribed HEP. Patient was educated on signs and symptoms of infection and other risk factors and advised to seek medical attention should they occur.  Patient verbalized understanding of these instructions and education.   Patient Verbal Consent Given: Yes Education Handout Provided: Yes Muscles Treated: Rt piriformis Electrical Stimulation Performed: No Treatment Response/Outcome: twitch responses noted      TREATMENT                                                                          DATE: 06/19/2023  Orthotic Training: Patient's AFO needs new padding to decrease risk of skin issues.  PT educated that she could use flesh colored sleeve to cover AFO for cosmetics if desired. PT & pt discussed difference in pain with AFO vs Foot-Up orthosis.  Pt wants to look into different model of AFO.  PT requested MD prescription to work with orthotist on options.  Pt verbalized understanding and will call to set up consult appt with Amy W at [facility].   Self-Care: PT demo & verbal cues on position in bed including tenting sheets off feet, LE & upper body using pillows.  Pt verbalized understanding.      TREATMENT  DATE: 06/05/2023 Therapeutic Exercise: - Seated Piriformis Stretch with Trunk Bend  - BLEs 1 reps - 30 seconds hold - Seated Piriformis Stretch switched to this Piriformis stretch BLEs 3 reps - 30 seconds hold - Supine Hamstring Stretch with Strap BLEs 2 reps - 30  seconds hold - Supine ITB Stretch with Strap BLEs 3 reps - 30 seconds hold - Supine Piriformis Stretch  BLEs 2 reps - 30 seconds hold - Clamshell  2 sets - 10 reps - 5 seconds hold PT cued on technique.   PT updated HEP with HO, tactile & verbal cues.  Pt verbalized understanding.   Orthotic Training: PT trial of left Ypsilon carbon AFO.  Pt had improved foot clearance.  PT is recommending using AFO over Foot-Up orthosis on uneven terrains and longer walks for now.  She can use Foot-Up for household & basic community with limited distances and mainly all firm surfaces.  PT explained that we need to try to use it to determine if controlling foot drop better has an impact on her hip pain.  Pt verbalizes understanding and agrees to try AFO.     PATIENT EDUCATION:  Education details: HEP, POC Person educated: Patient Education method: Programmer, multimedia, Demonstration, Verbal cues, and Handouts Education comprehension: verbalized understanding, returned demonstration, and verbal cues required  HOME EXERCISE PROGRAM: Access Code: [email or web address] URL: [email or web address] Date: 06/05/2023 Prepared by: Lorie Rook  Exercises - Seated Piriformis Stretch with Trunk Bend  - 1-2 x daily - 7 x weekly - 1 sets - 3 reps - 30 seconds hold - Seated Piriformis Stretch  - 1 x daily - 5 x weekly - 1 sets - 10 reps - 5 seconds hold - Supine ITB Stretch with Strap  - 1-2 x daily - 7 x weekly - 1 sets - 3 reps - 30 seconds hold - Supine Hamstring Stretch with Strap  - 1-2 x daily - 7 x weekly - 1 sets - 3 reps - 30 seconds hold - Supine Piriformis Stretch  - 1-2 x daily - 7 x weekly - 1 sets - 3 reps - 30 seconds hold - Clamshell  - 1 x daily - 7 x weekly - 2 sets - 10 reps - 5 seconds hold  ASSESSMENT: CLINICAL IMPRESSION: Patient appears to understand orthotic and gait recommendations & technique instructed today. Her right hip pain was higher entering PT today but improved with dry needling, soft  tissue work and gait.    OBJECTIVE IMPAIRMENTS: Abnormal gait, decreased activity tolerance, decreased balance, decreased endurance, decreased knowledge of condition, decreased knowledge of use of DME, decreased mobility, decreased ROM, decreased strength, pain, and orthotic dependency.   ACTIVITY LIMITATIONS: carrying, standing, squatting, sleeping, stairs, and locomotion level  PARTICIPATION LIMITATIONS: community activity and yard work  PERSONAL FACTORS: Time since onset of injury/illness/exacerbation and 3+ comorbidities: see PMH are also affecting patient's functional outcome.   REHAB POTENTIAL: Excellent  CLINICAL DECISION MAKING: Stable/uncomplicated  EVALUATION COMPLEXITY: Low   GOALS: Goals reviewed with patient? Yes  SHORT TERM GOALS: (target date for Short term goals 06/29/2023)   1.  Patient will demonstrate independent use of home exercise program to maintain progress from in clinic treatments. Baseline: See objective data Goal status: Ongoing    06/25/2023     2. Patient reports 50% improvement in pain in both hips. Baseline: See objective data Goal status: Ongoing    06/25/2023   3. Patient verbalizes understanding of AFO / orthotic recommendations.  Baseline: See objective data Goal status:  Ongoing   06/25/2023     LONG TERM GOALS: (target dates for all long term goals  08/09/2023 )   1. Patient will demonstrate/report pain at worst less than or equal to 2/10 to facilitate minimal limitation in daily activity secondary to pain symptoms. Baseline: See objective data Goal status: Ongoing   06/25/2023     2. Patient will demonstrate independent use of home exercise program to facilitate ability to maintain/progress functional gains from skilled physical therapy services. Baseline: See objective data Goal status: Ongoing   06/25/2023     3.  Patient reports Patient-Specific Activity Score improved the average >/= 7 to indicate improvement in functional activities.   Baseline: SEE OBJECTIVE DATA Goal status: Ongoing    06/25/2023       4.  Patient will demonstrate bilateral hip LE MMT 5/5 throughout to faciltiate usual transfers, stairs, squatting at [facility] for daily life.  Baseline: See objective data Goal status: Ongoing    06/25/2023     5.  Patient verbalizes understanding of orthotic recommendations to manage her long term left foot drop.  Baseline: See objective data Goal status: Ongoing   06/25/2023      6.  pt reports able to ambulate >1 mile, negotiate ramps, curbs & stairs with single rail.   Baseline: See objective data Goal status:  Ongoing   06/25/2023     PLAN:  PT FREQUENCY:  1-2x/week (will start with 1x/wk and increase if needed)  PT DURATION: 10 weeks  PLANNED INTERVENTIONS: 97164- PT Re-evaluation, 97110-Therapeutic exercises, 97530- Therapeutic activity, [id number]- Neuromuscular re-education, 97535- Self Care, 16109- Manual therapy, [id number]- Gait training, [phone]- Orthotic Initial, [phone]- Orthotic/Prosthetic subsequent, [phone]- Ionotophoresis 4mg /ml Dexamethasone , Patient/Family education, Balance training, Stair training, Taping, Dry Needling, Joint mobilization, DME instructions, Cryotherapy, and Moist heat  PLAN FOR NEXT SESSION:    continue to Review & Update HEP.  Check if she got an appt with orthotist. Pain management manual therapy.    Lorie Rook, PT, DPT 06/25/2023, 12:52 PM   Referring diagnosis? M79.605 (ICD-10-CM) - Pain in left leg  5.551 (ICD-10-CM) - Pain in right hip  Treatment diagnosis? (if different than referring diagnosis)  Pain in right hip  M25.551  Pain in left hip M25.552  Other abnormalities of gait and mobility R26.89  Muscle weakness (generalized)  M62.81  Foot drop, left M21.372  What was this (referring dx) caused by? []  Surgery []  Fall []  Ongoing issue [x]  Arthritis []  Other: ____________  Laterality: []  Rt []  Lt [x]  Both  Check all possible CPT codes:  *CHOOSE 10 OR LESS*    See  Planned Interventions listed in the Plan section of the Evaluation.

## 2023-06-25 NOTE — Patient Instructions (Signed)

## 2023-06-26 ENCOUNTER — Telehealth: Payer: Self-pay

## 2023-06-26 NOTE — Telephone Encounter (Signed)
 Had x-rays of her back and hips a month ago.  Welcome to come in for repeat evaluation and we can decide for or against x-rays at that time based on symptoms as well as absence or presence of intervening trauma

## 2023-06-26 NOTE — Telephone Encounter (Signed)
 Patient was seen in PT yesterday.  She would like for an xray to be ordered but wanted to talk with you first. Please advise.  [phone]

## 2023-07-02 ENCOUNTER — Other Ambulatory Visit (INDEPENDENT_AMBULATORY_CARE_PROVIDER_SITE_OTHER): Payer: Self-pay

## 2023-07-02 ENCOUNTER — Ambulatory Visit: Admitting: Orthopedic Surgery

## 2023-07-02 ENCOUNTER — Other Ambulatory Visit: Payer: Self-pay

## 2023-07-02 ENCOUNTER — Encounter: Payer: Self-pay | Admitting: Orthopedic Surgery

## 2023-07-02 ENCOUNTER — Encounter: Payer: Self-pay | Admitting: Physical Therapy

## 2023-07-02 ENCOUNTER — Ambulatory Visit: Admitting: Physical Therapy

## 2023-07-02 DIAGNOSIS — M545 Low back pain, unspecified: Secondary | ICD-10-CM | POA: Diagnosis not present

## 2023-07-02 DIAGNOSIS — M7061 Trochanteric bursitis, right hip: Secondary | ICD-10-CM | POA: Diagnosis not present

## 2023-07-02 DIAGNOSIS — M6281 Muscle weakness (generalized): Secondary | ICD-10-CM

## 2023-07-02 DIAGNOSIS — M21372 Foot drop, left foot: Secondary | ICD-10-CM | POA: Diagnosis not present

## 2023-07-02 DIAGNOSIS — R2689 Other abnormalities of gait and mobility: Secondary | ICD-10-CM | POA: Diagnosis not present

## 2023-07-02 DIAGNOSIS — M25551 Pain in right hip: Secondary | ICD-10-CM

## 2023-07-02 DIAGNOSIS — M25552 Pain in left hip: Secondary | ICD-10-CM

## 2023-07-02 MED ORDER — LIDOCAINE HCL 1 % IJ SOLN
5.0000 mL | INTRAMUSCULAR | Status: AC | PRN
  Administered 2023-07-02: 5 mL

## 2023-07-02 MED ORDER — BUPIVACAINE HCL 0.25 % IJ SOLN
4.0000 mL | INTRAMUSCULAR | Status: AC | PRN
  Administered 2023-07-02: 4 mL via INTRA_ARTICULAR

## 2023-07-02 NOTE — Progress Notes (Signed)
 Office Visit Note   Patient: Holly Cochran           Date of Birth: [birth year]           MRN: [id number] Visit Date: 07/02/2023 Requested by: Geofm Glade PARAS, MD [address] [place],  [place] [address] PCP: Geofm Glade PARAS, MD  Subjective: Chief Complaint  Patient presents with   Lower Back - Pain   Right Hip - Pain    HPI: Holly Cochran is a 73 y.o. female who presents to the office reporting right hip and buttock pain.  The hip pain is more on the lateral aspect of the trochanteric region.  Does report low back pain as well.  Does radiate into the buttocks.  No numbness and tingling.  Does wake her from sleep at night.  Tylenol  arthritis gives her some relief.  She is leaving for [place] next week.  Starts in her back and runs down into the sciatic notch region and laterally in the trochanteric region.  She did have known left hip arthritis following intramedullary hip screw for hip fracture.  Intra-articular injection in the left hip 05/18/2023 did give her good relief.                ROS: All systems reviewed are negative as they relate to the chief complaint within the history of present illness.  Patient denies fevers or chills.  Assessment & Plan: Visit Diagnoses:  1. Pain in right hip   2. Right low back pain, unspecified chronicity, unspecified whether sciatica present     Plan: Impression is right hip pain in the trochanteric and buttock region.  Greater trochanteric injection performed today.  Patient had an injection like that 2 months ago and it did help her.  Now she is having a lot of sciatic notch pain which does appear to be coming from her back.  Dr. Onetha has done an MRI scan on her back.  I think that should be available somewhere in the chart but I could not find today.  Would like for Thu to get a lumbar spine ESI and she will follow-up with us  as needed.  She is really not having any groin pain at this time.  Follow-Up Instructions: No follow-ups on file.    Orders:  Orders Placed This Encounter  Procedures   XR HIP UNILAT W OR W/O PELVIS 2-3 VIEWS RIGHT   XR Lumbar Spine 2-3 Views   US  Guided Needle Placement - No Linked Charges   Ambulatory referral to Physical Medicine Rehab   No orders of the defined types were placed in this encounter.     Procedures: Large Joint Inj: R greater trochanter on 07/02/2023 9:21 PM Indications: pain and diagnostic evaluation Details: 22 G 3.5 in needle, ultrasound-guided lateral approach  Arthrogram: No  Medications: 5 mL lidocaine  1 %; 4 mL bupivacaine  0.25 % Outcome: tolerated well, no immediate complications Procedure, treatment alternatives, risks and benefits explained, specific risks discussed. Consent was given by the patient. Immediately prior to procedure a time out was called to verify the correct patient, procedure, equipment, support staff and site/side marked as required. Patient was prepped and draped in the usual sterile fashion.      Kenalog injected Clinical Data: No additional findings.  Objective: Vital Signs: LMP 01/09/2005   Physical Exam:  Constitutional: Patient appears well-developed HEENT:  Head: Normocephalic Eyes:EOM are normal Neck: Normal range of motion Cardiovascular: Normal rate Pulmonary/chest: Effort normal Neurologic: Patient is  alert Skin: Skin is warm Psychiatric: Patient has normal mood and affect  Ortho Exam: Ortho exam demonstrates trochanteric tenderness on the right but not the left.  Patient also has some sciatic notch tenderness to direct palpation on the right without any pain with resisted knee flexion.  No asymmetric groin pain with internal and external rotation on that right-hand side.  No nerve root tension signs.  Pedal pulses palpable.  Specialty Comments:  No specialty comments available.  Imaging: XR Lumbar Spine 2-3 Views Result Date: 07/02/2023 AP lateral radiographs lumbar spine reviewed.  Degenerative changes are present  between the vertebral bodies as well as in the facet joints.  No acute compression fracture or spondylolisthesis.  XR HIP UNILAT W OR W/O PELVIS 2-3 VIEWS RIGHT Result Date: 07/02/2023 AP pelvis lateral radiographs right hip reviewed.  Mild lateral spurring is present but overall the joint space is maintained.  No acute fracture.  Degenerative changes noted in the lower lumbar spine.  US  Guided Needle Placement - No Linked Charges Result Date: 07/02/2023 Ultrasound imaging demonstrates needle placement between the right greater trochanter and the tensor fascia lata with injection of fluid into the space and no complicating features    PMFS History: Patient Active Problem List   Diagnosis Date Noted   Prediabetes 05/08/2023   Mid back pain on right side 12/14/2022   Chronic sinus infection 06/26/2022   Chronic upper back pain 01/17/2021   Left upper arm pain 01/17/2021   TMJ arthralgia 04/14/2019   Essential hypertension 03/15/2019   Intertrochanteric fracture of left femur (HCC) 11/18/2017   Vitamin D  deficiency 08/31/2016   Family history of diabetes mellitus in mother 01/30/2016   Anxiety and depression 03/02/2015   Chronically dry eyes 07/20/2014   Common peroneal neuropathy of left lower extremity 05/20/2014   GERD (gastroesophageal reflux disease) 02/08/2011   RHINITIS 11/24/2008   GANGLION OF TENDON SHEATH 11/10/2008   Hyperlipidemia 08/27/2007   Osteoporosis 08/27/2007   Hypothyroidism, postradioiodine therapy 06/01/2006   Past Medical History:  Diagnosis Date   Common peroneal neuropathy of left lower extremity 05/20/2014   FASCIITIS, PLANTAR 10/14/2007   Qualifier: Diagnosis of  By: Tish MD, Elsie     GERD (gastroesophageal reflux disease)    Hyperlipidemia    Osteopenia 2015   T score -1.1   Thyroid  disease    hyperthyroid-RA I    Family History  Problem Relation Age of Onset   Diabetes Mother    Kidney disease Mother    Hypertension Mother    Cancer  Father        Pancreatic   Breast cancer Paternal Aunt        Age 21's   Cancer Maternal Uncle        melanoma   Diabetes Maternal Uncle    Colon cancer Neg Hx    Esophageal cancer Neg Hx    Stomach cancer Neg Hx    Rectal cancer Neg Hx     Past Surgical History:  Procedure Laterality Date   COLONOSCOPY  2005    Dr Jakie, negative   INTRAMEDULLARY (IM) NAIL INTERTROCHANTERIC Left 11/18/2017   Procedure: INTRAMEDULLARY (IM) NAIL INTERTROCHANTRIC;  Surgeon: Yvone Rush, MD;  Location: [facility];  Service: Orthopedics;  Laterality: Left;   PLANTAR FASCIA SURGERY  08/2010   TONSILLECTOMY     Social History   Occupational History   Not on file  Tobacco Use   Smoking status: Never    Passive exposure: Never   Smokeless  tobacco: Never  Vaping Use   Vaping status: Not on file  Substance and Sexual Activity   Alcohol use: Yes    Alcohol/week: 1.0 standard drink of alcohol    Types: 1 Standard drinks or equivalent per week    Comment: occasionally   Drug use: No   Sexual activity: Never    Birth control/protection: Abstinence, Post-menopausal    Comment: Virgin

## 2023-07-02 NOTE — Therapy (Addendum)
 OUTPATIENT PHYSICAL THERAPY LOWER EXTREMITY TREATMENT   Patient Name: Holly Cochran MRN: [id number] DOB:12/22/1950, 73 y.o., female Today's Date: 07/02/2023  PHYSICAL THERAPY DISCHARGE SUMMARY  Visits from Start of Care: 5  Current functional level related to goals / functional outcomes: See below. Unknown progress as patient not return to PT.    Remaining deficits: See below   Education / Equipment: Patient was instructed in HEP which they appeared to understand.    Patient agrees to discharge. Patient goals were Unknown as did not return. Patient is being discharged due to not returning since the last visit.    Grayce Spatz, PT 09/26/2023, 8:26 AM  END OF SESSION:  PT End of Session - 07/02/23 1019     Visit Number 5    Number of Visits 20    Date for PT Re-Evaluation 08/09/23    Authorization Type [employer]    Authorization Time Period $20 copay  Authorization #[id number]  Tracking #[id number]  10 PT Visits  05/31/2023-0731/2025    Authorization - Visit Number 5    Authorization - Number of Visits 10    Progress Note Due on Visit 10    PT Start Time 1016    PT Stop Time 1100    PT Time Calculation (min) 44 min    Activity Tolerance Patient tolerated treatment well;Patient limited by pain    Behavior During Therapy [facility] for tasks assessed/performed              Past Medical History:  Diagnosis Date   Common peroneal neuropathy of left lower extremity 05/20/2014   FASCIITIS, PLANTAR 10/14/2007   Qualifier: Diagnosis of  By: Tish MD, Elsie     GERD (gastroesophageal reflux disease)    Hyperlipidemia    Osteopenia 2015   T score -1.1   Thyroid  disease    hyperthyroid-RA I   Past Surgical History:  Procedure Laterality Date   COLONOSCOPY  2005    Dr Jakie, negative   INTRAMEDULLARY (IM) NAIL INTERTROCHANTERIC Left 11/18/2017   Procedure: INTRAMEDULLARY (IM) NAIL INTERTROCHANTRIC;  Surgeon: Yvone Rush, MD;  Location: [facility];  Service:  Orthopedics;  Laterality: Left;   PLANTAR FASCIA SURGERY  08/2010   TONSILLECTOMY     Patient Active Problem List   Diagnosis Date Noted   Prediabetes 05/08/2023   Mid back pain on right side 12/14/2022   Chronic sinus infection 06/26/2022   Chronic upper back pain 01/17/2021   Left upper arm pain 01/17/2021   TMJ arthralgia 04/14/2019   Essential hypertension 03/15/2019   Intertrochanteric fracture of left femur (HCC) 11/18/2017   Vitamin D  deficiency 08/31/2016   Family history of diabetes mellitus in mother 01/30/2016   Anxiety and depression 03/02/2015   Chronically dry eyes 07/20/2014   Common peroneal neuropathy of left lower extremity 05/20/2014   GERD (gastroesophageal reflux disease) 02/08/2011   RHINITIS 11/24/2008   GANGLION OF TENDON SHEATH 11/10/2008   Hyperlipidemia 08/27/2007   Osteoporosis 08/27/2007   Hypothyroidism, postradioiodine therapy 06/01/2006    PCP: Geofm Glade PARAS, MD  REFERRING PROVIDER: Addie Cordella Hamilton, MD  REFERRING DIAG:  [phone] (ICD-10-CM) - Pain in left leg  5.551 (ICD-10-CM) - Pain in right hip   THERAPY DIAG:  Pain in right hip  Pain in left hip  Other abnormalities of gait and mobility  Muscle weakness (generalized)  Foot drop, left  Rationale for Evaluation and Treatment: Rehabilitation  ONSET DATE: 05/17/2023 MD referral to PT  SUBJECTIVE:   SUBJECTIVE STATEMENT: Hip  pain is similar to last week.  She has AFO some but not much. Hip pain is so bad she is limiting walking.    PERTINENT HISTORY: ORIF left intramedullary nail intertrochanteric 34yrs, LBP, HTN, osteoporosis, common peroneal neuropathy of LLE, plantar fasciitis w/sg 2012,   DIAGNOSTIC FINDINGS:  05/17/2023  X-ray of spine AP lateral radiographs lumbar spine reviewed.  No acute fracture.  No spondylolisthesis.  Mild to moderate facet arthritis present L4-5 and L5-S1.  Disc space reasonably well-maintained.    X-ray of left femur AP lateral radiographs left  femur reviewed.  Intramedullary hip screw in good position and alignment with healed fracture.  Moderate arthritis is present in the left hip.  No acute fracture   PAIN:  NPRS scale: right hip at rest 1-3/10 standing & walking with AFO up to 4/10, with Foot-Up 4/10 and left hip at rest 0/10 standing & walking with AFO 0/10 walking with Foot-Up up to 2/10 Pain location: right hip more posterior in buttocks and left hip lateral radiating down to knee Pain description: right hip painful ache at rest & increases with standing /walking to throbbing;  left hip dull ache at rest and sharper with activity  Aggravating factors: standing & walking Relieving factors: sitting to rest / stop activity, takes arthritis tylenol  2x/day  PRECAUTIONS: None  WEIGHT BEARING RESTRICTIONS: No  FALLS:  Has patient fallen in last 6 months? No  LIVING ENVIRONMENT: Lives with: lives with their family Lives in: [address] with bedroom / bath downstairs;  spare rooms upstairs Stairs: Yes: Internal: 14-15 steps; on right going up and External: 2 steps; can reach both Has following equipment at home: None  OCCUPATION: retired from business  PLOF: Independent  PATIENT GOALS:  to walk & stand without pain  Next MD visit:  as needed  OBJECTIVE:   Patient-Specific Activity Scoring Scheme  0 represents "unable to perform." 10 represents "able to perform at prior level. 0 1 2 3 4 5 6 7 8 9  10 (Date and Score)   Activity Eval     1.  Standing without pain  5    2. Walking without pain 4     3. Stairs without pain 3   4.    5.    Score 4    Total score = sum of the activity scores/number of activities Minimum detectable change (90%CI) for average score = 2 points Minimum detectable change (90%CI) for single activity score = 3 points  COGNITION: Overall cognitive status: WFL    SENSATION: WFL  EDEMA:  None noted  MUSCLE LENGTH: Hamstrings: bilateral WFL  POSTURE: No Significant postural  limitations  PALPATION: Evaluation: tenderness right hip more in buttocks at Piriformis and left ITB from hip to distal knee more at Greater Trochanteric bursa  LOWER EXTREMITY ROM:   ROM Right eval Left eval  Hip flexion 100* 105*  Hip extension 0* 0*  Hip abduction    Hip adduction    Hip internal rotation [facility]  Hip external rotation [facility]  Knee flexion    Knee extension    Ankle dorsiflexion    Ankle plantarflexion    Ankle inversion    Ankle eversion     (Blank rows = not tested)  LOWER EXTREMITY MMT:  MMT Right eval Left eval  Hip flexion    Hip extension 4/5 4/5  Hip abduction 4/5 4/5  Hip adduction    Hip internal rotation    Hip external rotation  Knee flexion    Knee extension    Ankle dorsiflexion    Ankle plantarflexion    Ankle inversion    Ankle eversion     (Blank rows = not tested)  FUNCTIONAL TESTS:  18 inch chair transfer: sit to/from  stand without UE assist   GAIT: Distance walked: 200' Assistive device utilized: None except LLE Foot-Up orthosis Level of assistance: SBA / verbal cues for technique - no balance issue noted Comments: patient ambulates with steppage gait due to left foot drop, increased lateral trunk lean to right with RLE stance, LLE increased hip/knee flexion with intermittent circumduction in swing. Gait Velocity: self-selected pace 3.14 ft/sec and fast pace 3.95 ft/sec   TODAY'S TREATMENT                                                                          DATE: 07/02/2023 Therapeutic Exercise: Standing in door way (decrease lateral trunk lean) hip hiking 10 reps 2 sets  Manual Therapy: Percussive gun to right Piriformis and lateral hip.    Orthotic Training: PT recommended using AFO not Foot-up orthosis for 2 consecutive days to assess if changes her right hip pain.  Pre-gait with BUE support working on weight shift fully over forward foot with contralateral LE knee flexion rolling off toes in terminal  stance position. Progressed to ambulating with single UE support on counter with focus on carryover of above.  Pt amb with PT supervision working full weight shift over stance LE with hip/knee ext.   Resistive gait to facilitate fuller weight shift.   PT recommended slowing gait to focus on carryover of weight shift.  Pt verbalized understanding.    TREATMENT                                                                          DATE: 06/25/2023 Self-Care: - PT demo & verbal cues on use of tennis ball supine & standing against wall and percussion gun to piriformis muscle. Pt return demo & verbalized understanding.   Orthotic Training: PT reviewed recommendation for using AFO over Foot-Up on uneven terrain, hills or inclined surfaces and longer distances.  She is scheduling appt with orthotist to discuss options for new AFO.  Pt verbalized understanding.  PT reviewed Foot-Up orthosis donning with foot dorsiflexed to optimize use.  Pt verbalized understanding.  PT demo & verbal cues on weight shift so pelvis over stance foot and contralateral LE in terminal stance with heel rise prior to stepping forward.  Worked on inside //bars on weight shift with proper step width.  Progressed to outside of //bars self-selected and fast pace.  Pt verbalized understanding.   Manual STM with compression to Rt glutes and piriformis; skilled palpation and monitoring of soft tissue during DN  Trigger Point Dry Needling  Initial Treatment: Pt instructed on Dry Needling rational, procedures, and possible side effects. Pt instructed to expect mild to moderate muscle soreness later in the day  and/or into the next day.  Pt instructed in methods to reduce muscle soreness. Pt instructed to continue prescribed HEP. Patient was educated on signs and symptoms of infection and other risk factors and advised to seek medical attention should they occur.  Patient verbalized understanding of these instructions and  education.   Patient Verbal Consent Given: Yes Education Handout Provided: Yes Muscles Treated: Rt piriformis Electrical Stimulation Performed: No Treatment Response/Outcome: twitch responses noted      TREATMENT                                                                          DATE: 06/19/2023  Orthotic Training: Patient's AFO needs new padding to decrease risk of skin issues.  PT educated that she could use flesh colored sleeve to cover AFO for cosmetics if desired. PT & pt discussed difference in pain with AFO vs Foot-Up orthosis.  Pt wants to look into different model of AFO.  PT requested MD prescription to work with orthotist on options.  Pt verbalized understanding and will call to set up consult appt with Amy W at [facility].   Self-Care: PT demo & verbal cues on position in bed including tenting sheets off feet, LE & upper body using pillows.  Pt verbalized understanding.      TREATMENT                                                                          DATE: 06/05/2023 Therapeutic Exercise: - Seated Piriformis Stretch with Trunk Bend  - BLEs 1 reps - 30 seconds hold - Seated Piriformis Stretch switched to this Piriformis stretch BLEs 3 reps - 30 seconds hold - Supine Hamstring Stretch with Strap BLEs 2 reps - 30 seconds hold - Supine ITB Stretch with Strap BLEs 3 reps - 30 seconds hold - Supine Piriformis Stretch  BLEs 2 reps - 30 seconds hold - Clamshell  2 sets - 10 reps - 5 seconds hold PT cued on technique.   PT updated HEP with HO, tactile & verbal cues.  Pt verbalized understanding.   Orthotic Training: PT trial of left Ypsilon carbon AFO.  Pt had improved foot clearance.  PT is recommending using AFO over Foot-Up orthosis on uneven terrains and longer walks for now.  She can use Foot-Up for household & basic community with limited distances and mainly all firm surfaces.  PT explained that we need to try to use it to determine if controlling foot drop  better has an impact on her hip pain.  Pt verbalizes understanding and agrees to try AFO.     PATIENT EDUCATION:  Education details: HEP, POC Person educated: Patient Education method: Programmer, multimedia, Demonstration, Verbal cues, and Handouts Education comprehension: verbalized understanding, returned demonstration, and verbal cues required  HOME EXERCISE PROGRAM: Access Code: [email or web address] URL: [email or web address] Date: 06/05/2023 Prepared by: Grayce Spatz  Exercises - Seated Piriformis Stretch with Trunk Bend  - 1-2 x daily - 7  x weekly - 1 sets - 3 reps - 30 seconds hold - Seated Piriformis Stretch  - 1 x daily - 5 x weekly - 1 sets - 10 reps - 5 seconds hold - Supine ITB Stretch with Strap  - 1-2 x daily - 7 x weekly - 1 sets - 3 reps - 30 seconds hold - Supine Hamstring Stretch with Strap  - 1-2 x daily - 7 x weekly - 1 sets - 3 reps - 30 seconds hold - Supine Piriformis Stretch  - 1-2 x daily - 7 x weekly - 1 sets - 3 reps - 30 seconds hold - Clamshell  - 1 x daily - 7 x weekly - 2 sets - 10 reps - 5 seconds hold  ASSESSMENT: CLINICAL IMPRESSION: Patient's left hip pain has improved but her right pain continues to be significantly limiting.  PT continues to recommend patient to use AFO of her foot up orthosis as it has better foot clearance.  She has a consult set up with the orthotist but not until mid July at this point to discuss if there is another AFO option that would work better for her.  PT worked on gait with emphasis on weight shift to stance lower extremity so that contralateral limb is in terminal stance with knee flexion to prepare for swing.  She improved this with instruction but has to focus and go slow.  This would take lots of repetition to break a habit she has had for close to 40 years with foot drop.  OBJECTIVE IMPAIRMENTS: Abnormal gait, decreased activity tolerance, decreased balance, decreased endurance, decreased knowledge of condition, decreased  knowledge of use of DME, decreased mobility, decreased ROM, decreased strength, pain, and orthotic dependency.   ACTIVITY LIMITATIONS: carrying, standing, squatting, sleeping, stairs, and locomotion level  PARTICIPATION LIMITATIONS: community activity and yard work  PERSONAL FACTORS: Time since onset of injury/illness/exacerbation and 3+ comorbidities: see PMH are also affecting patient's functional outcome.   REHAB POTENTIAL: Excellent  CLINICAL DECISION MAKING: Stable/uncomplicated  EVALUATION COMPLEXITY: Low   GOALS: Goals reviewed with patient? Yes  SHORT TERM GOALS: (target date for Short term goals 06/29/2023)   1.  Patient will demonstrate independent use of home exercise program to maintain progress from in clinic treatments. Baseline: See objective data Goal status: MET   06/25/2023     2. Patient reports 50% improvement in pain in both hips. Baseline: See objective data Goal status: MET  06/25/2023 when using AFO  3. Patient verbalizes understanding of AFO / orthotic recommendations.  Baseline: See objective data Goal status:   MET 06/25/2023     LONG TERM GOALS: (target dates for all long term goals  08/09/2023 )   1. Patient will demonstrate/report pain at worst less than or equal to 2/10 to facilitate minimal limitation in daily activity secondary to pain symptoms. Baseline: See objective data Goal status: Ongoing   07/02/2023     2. Patient will demonstrate independent use of home exercise program to facilitate ability to maintain/progress functional gains from skilled physical therapy services. Baseline: See objective data Goal status: Ongoing   07/02/2023     3.  Patient reports Patient-Specific Activity Score improved the average >/= 7 to indicate improvement in functional activities.  Baseline: SEE OBJECTIVE DATA Goal status: Ongoing     07/02/2023     4.  Patient will demonstrate bilateral hip LE MMT 5/5 throughout to faciltiate usual transfers, stairs,  squatting at [facility] for daily life.  Baseline: See  objective data Goal status: Ongoing     07/02/2023       5.  Patient verbalizes understanding of orthotic recommendations to manage her long term left foot drop.  Baseline: See objective data Goal status: Ongoing   07/02/2023     6.  pt reports able to ambulate >1 mile, negotiate ramps, curbs & stairs with single rail.   Baseline: See objective data Goal status:  Ongoing   07/02/2023     PLAN:  PT FREQUENCY:  1-2x/week (will start with 1x/wk and increase if needed)  PT DURATION: 10 weeks  PLANNED INTERVENTIONS: 97164- PT Re-evaluation, 97110-Therapeutic exercises, 97530- Therapeutic activity, [id number]- Neuromuscular re-education, 97535- Self Care, 02859- Manual therapy, [id number]- Gait training, [phone]- Orthotic Initial, [phone]- Orthotic/Prosthetic subsequent, [phone]- Ionotophoresis 4mg /ml Dexamethasone , Patient/Family education, Balance training, Stair training, Taping, Dry Needling, Joint mobilization, DME instructions, Cryotherapy, and Moist heat  PLAN FOR NEXT SESSION: Continue to work on gait.  continue to Review & Update HEP.  Check on appointment with Dr. Addie.  Pain management manual therapy.    Grayce Spatz, PT, DPT 07/02/2023, 4:08 PM   Referring diagnosis? M79.605 (ICD-10-CM) - Pain in left leg  5.551 (ICD-10-CM) - Pain in right hip  Treatment diagnosis? (if different than referring diagnosis)  Pain in right hip  M25.551  Pain in left hip M25.552  Other abnormalities of gait and mobility R26.89  Muscle weakness (generalized)  M62.81  Foot drop, left M21.372  What was this (referring dx) caused by? []  Surgery []  Fall []  Ongoing issue [x]  Arthritis []  Other: ____________  Laterality: []  Rt []  Lt [x]  Both  Check all possible CPT codes:  *CHOOSE 10 OR LESS*    See Planned Interventions listed in the Plan section of the Evaluation.

## 2023-07-05 ENCOUNTER — Encounter: Admitting: Physical Therapy

## 2023-07-10 ENCOUNTER — Ambulatory Visit: Admitting: Surgical

## 2023-07-11 ENCOUNTER — Telehealth: Payer: Self-pay

## 2023-07-11 ENCOUNTER — Encounter: Admitting: Physical Therapy

## 2023-07-11 DIAGNOSIS — M21379 Foot drop, unspecified foot: Secondary | ICD-10-CM

## 2023-07-11 NOTE — Telephone Encounter (Signed)
 Copied from CRM [id number]. Topic: Referral - Request for Referral >> Jul 11, 2023 10:09 AM Aleatha C wrote: Did the patient discuss referral with their provider in the last year? Yes (If No - schedule appointment) (If Yes - send message)  Appointment offered? Yes  Type of order/referral and detailed reason for visit:  Dropped Foot  Preference of office, provider, location: [facility] in [place]   If referral order, have you been seen by this specialty before? No (If Yes, this issue or another issue? When? Where?  Can we respond through MyChart? No

## 2023-07-11 NOTE — Telephone Encounter (Signed)
 Copied from CRM [id number]. Topic: General - Other >> Jul 11, 2023 10:12 AM Aleatha BROCKS wrote: Reason for CRM: Patient left number for [facility] [phone]

## 2023-07-21 NOTE — Addendum Note (Signed)
 Addended by: GEOFM GLADE PARAS on: 07/21/2023 11:21 AM   Modules accepted: Orders

## 2023-07-21 NOTE — Telephone Encounter (Signed)
 Referral ordered

## 2023-07-24 ENCOUNTER — Other Ambulatory Visit: Payer: Self-pay | Admitting: Internal Medicine

## 2023-07-24 DIAGNOSIS — E890 Postprocedural hypothyroidism: Secondary | ICD-10-CM

## 2023-07-26 ENCOUNTER — Encounter: Payer: Self-pay | Admitting: Physical Medicine and Rehabilitation

## 2023-07-26 ENCOUNTER — Ambulatory Visit: Admitting: Physical Medicine and Rehabilitation

## 2023-07-26 DIAGNOSIS — M7918 Myalgia, other site: Secondary | ICD-10-CM

## 2023-07-26 DIAGNOSIS — M7061 Trochanteric bursitis, right hip: Secondary | ICD-10-CM

## 2023-07-26 DIAGNOSIS — M7071 Other bursitis of hip, right hip: Secondary | ICD-10-CM

## 2023-07-26 DIAGNOSIS — M47816 Spondylosis without myelopathy or radiculopathy, lumbar region: Secondary | ICD-10-CM
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
## 2023-07-26 NOTE — Progress Notes (Signed)
 Pain Scale   Average Pain 2 Patient states she has chronic lower back pain radiating to right hip area, patient also advises she has a drop foot on left side.        +Driver, -BT, -Dye Allergies.

## 2023-08-09 ENCOUNTER — Ambulatory Visit: Admitting: Physical Medicine and Rehabilitation

## 2023-08-09 ENCOUNTER — Other Ambulatory Visit: Payer: Self-pay

## 2023-08-09 VITALS — BP 133/84 | HR 78

## 2023-08-09 DIAGNOSIS — M7061 Trochanteric bursitis, right hip: Secondary | ICD-10-CM | POA: Diagnosis not present

## 2023-08-09 DIAGNOSIS — M7071 Other bursitis of hip, right hip: Secondary | ICD-10-CM

## 2023-08-09 MED ORDER — BUPIVACAINE HCL 0.25 % IJ SOLN
4.0000 mL | INTRAMUSCULAR | Status: AC | PRN
  Administered 2023-08-09: 4 mL via INTRA_ARTICULAR

## 2023-08-09 MED ORDER — LIDOCAINE HCL 2 % IJ SOLN
4.0000 mL | INTRAMUSCULAR | Status: AC | PRN
  Administered 2023-08-09: 4 mL

## 2023-08-09 MED ORDER — TRIAMCINOLONE ACETONIDE 40 MG/ML IJ SUSP
40.0000 mg | INTRAMUSCULAR | Status: AC | PRN
  Administered 2023-08-09: 40 mg via INTRA_ARTICULAR

## 2023-08-09 NOTE — Progress Notes (Signed)
 Holly Cochran - 74 y.o. female MRN [id number]  Date of birth: 28-Jun-1950  Office Visit Note: Visit Date: 08/09/2023 PCP: Geofm Glade PARAS, MD Referred by: Geofm Glade PARAS, MD  Subjective: Chief Complaint  Patient presents with   Right Hip - Pain   HPI:  Holly Cochran is a 73 y.o. female who comes in today at the request of Duwaine Pouch, FNP for planned Right greater trochanteric injection and ischial bursa injection with fluoroscopic guidance.  The patient has failed conservative care including home exercise, medications, time and activity modification.  This injection will be diagnostic and hopefully therapeutic.  Please see requesting physician notes for further details and justification.   ROS Otherwise per HPI.  Assessment & Plan: Visit Diagnoses:    ICD-10-CM   1. Ischial bursitis, right  M70.71 XR C-ARM NO REPORT    Large Joint Inj: R greater trochanter    Large Joint Inj, Right Ischial Bursa    2. Greater trochanteric bursitis, right  M70.61 XR C-ARM NO REPORT    Large Joint Inj: R greater trochanter    Large Joint Inj, Right Ischial Bursa      Plan: No additional findings.   Meds & Orders: No orders of the defined types were placed in this encounter.   Orders Placed This Encounter  Procedures   Large Joint Inj: R greater trochanter   Large Joint Inj, Right Ischial Bursa   XR C-ARM NO REPORT    Follow-up: No follow-ups on file.   Procedures: Large Joint Inj: R greater trochanter on 08/09/2023 8:28 AM Indications: pain and diagnostic evaluation Details: 22 G 3.5 in needle, fluoroscopy-guided lateral approach  Arthrogram: No  Medications: 4 mL lidocaine  2 %; 4 mL bupivacaine  0.25 %; 40 mg triamcinolone  acetonide 40 MG/ML Outcome: tolerated well, no immediate complications  There was excellent flow of contrast outlined the greater trochanteric bursa without vascular uptake. Procedure, treatment alternatives, risks and benefits explained, specific risks  discussed. Consent was given by the patient. Immediately prior to procedure a time out was called to verify the correct patient, procedure, equipment, support staff and site/side marked as required. Patient was prepped and draped in the usual sterile fashion.    Large Joint Inj, Right Ischial Bursa on 08/09/2023 8:29 AM Indications: pain and diagnostic evaluation Details: 22 G 3.5 in needle, fluoroscopy-guided posterior approach  Arthrogram: No  Medications: 4 mL bupivacaine  0.25 %; 40 mg triamcinolone  acetonide 40 MG/ML Outcome: tolerated well, no immediate complications  There was excellent flow of contrast outlining the ischial bursa.  There was no vascular flow noted. Procedure, treatment alternatives, risks and benefits explained, specific risks discussed. Consent was given by the patient. Immediately prior to procedure a time out was called to verify the correct patient, procedure, equipment, support staff and site/side marked as required. Patient was prepped and draped in the usual sterile fashion.          Clinical History: CLINICAL DATA: Low back and left buttock pain for approximately 1 month. Left foot drop. No recent injury.  EXAM: MRI LUMBAR SPINE WITHOUT CONTRAST  TECHNIQUE: Multiplanar, multisequence MR imaging of the lumbar spine was performed. No intravenous contrast was administered.  COMPARISON: None Available.  FINDINGS: Segmentation: Standard.  Alignment: Normal.  Vertebrae: No fracture, evidence of discitis, or bone lesion. Scattered, small Schmorl's nodes noted.  Conus medullaris and cauda equina: Conus extends to the L1 level. Conus and cauda equina appear normal.  Paraspinal and other soft tissues: Negative.  Disc  levels:  T11-12 is imaged in the sagittal plane only. There is a shallow central and right paracentral protrusion but the central canal and foramina appear open.  T12-L1: Negative.  L1-2: Negative.  L2-3: Minimal disc bulge. No  stenosis.  L3-4: Shallow disc bulge with a small protrusion and associated annular fissure in the left foramen. No stenosis.  L4-5: Mild facet degenerative change and a shallow protrusion inferior aspect of the left foramen. No stenosis.  L5-S1: Negative.  IMPRESSION: 1. Shallow protrusions in the left foramina at L3-4 and L4-5 without neural impingement. 2. Shallow central and right paracentral protrusion at T11-12 without central canal or foraminal narrowing.   Electronically Signed By: Debby Prader M.D. On: 04/20/2023 09:37     Objective:  VS:  HT:    WT:   BMI:     BP:133/84  HR:78bpm  TEMP: ( )  RESP:  Physical Exam Vitals and nursing note reviewed.  Constitutional:      General: She is not in acute distress.    Appearance: Normal appearance. She is not ill-appearing.  HENT:     Head: Normocephalic and atraumatic.     Right Ear: External ear normal.     Left Ear: External ear normal.  Eyes:     Extraocular Movements: Extraocular movements intact.  Cardiovascular:     Rate and Rhythm: Normal rate.     Pulses: Normal pulses.  Pulmonary:     Effort: Pulmonary effort is normal. No respiratory distress.  Abdominal:     General: There is no distension.     Palpations: Abdomen is soft.  Musculoskeletal:        General: Tenderness present.     Cervical back: Neck supple.     Right lower leg: No edema.     Left lower leg: No edema.     Comments: Patient has good distal strength with pain over the right greater trochanter and right ischial bursa.  No clonus or focal weakness.  Skin:    Findings: No erythema, lesion or rash.  Neurological:     General: No focal deficit present.     Mental Status: She is alert and oriented to person, place, and time.     Sensory: No sensory deficit.     Motor: No weakness or abnormal muscle tone.     Coordination: Coordination normal.  Psychiatric:        Mood and Affect: Mood normal.        Behavior: Behavior normal.       Imaging: XR C-ARM NO REPORT Result Date: 08/09/2023 Please see Notes tab for imaging impression.

## 2023-08-09 NOTE — Patient Instructions (Signed)

## 2023-08-09 NOTE — Progress Notes (Signed)
 Pain Scale   Average Pain 5 Patient advised she has chronic right hip pain with buttocks pain.        +Driver, -BT, -Dye Allergies.

## 2023-09-28 ENCOUNTER — Telehealth: Payer: Self-pay | Admitting: Physical Medicine and Rehabilitation

## 2023-09-28 ENCOUNTER — Other Ambulatory Visit: Payer: Self-pay | Admitting: Physical Medicine and Rehabilitation

## 2023-09-28 ENCOUNTER — Other Ambulatory Visit (HOSPITAL_BASED_OUTPATIENT_CLINIC_OR_DEPARTMENT_OTHER): Payer: Self-pay

## 2023-09-28 DIAGNOSIS — M7071 Other bursitis of hip, right hip: Secondary | ICD-10-CM

## 2023-09-28 DIAGNOSIS — M7061 Trochanteric bursitis, right hip: Secondary | ICD-10-CM

## 2023-09-28 MED ORDER — COMIRNATY 30 MCG/0.3ML IM SUSY
0.3000 mL | PREFILLED_SYRINGE | Freq: Once | INTRAMUSCULAR | 0 refills | Status: AC
  Filled 2023-09-28: qty 0.3, 1d supply, fill #0

## 2023-09-28 NOTE — Telephone Encounter (Signed)
 Patient called and said she wants a cortisone shot. CB#[id number]

## 2023-10-01 ENCOUNTER — Other Ambulatory Visit: Payer: Self-pay | Admitting: Internal Medicine

## 2023-10-01 DIAGNOSIS — E7800 Pure hypercholesterolemia, unspecified: Secondary | ICD-10-CM

## 2023-10-02 ENCOUNTER — Ambulatory Visit: Admitting: Physical Medicine and Rehabilitation

## 2023-10-02 ENCOUNTER — Other Ambulatory Visit: Payer: Self-pay

## 2023-10-02 VITALS — BP 128/85 | HR 97

## 2023-10-02 DIAGNOSIS — M7071 Other bursitis of hip, right hip: Secondary | ICD-10-CM

## 2023-10-02 DIAGNOSIS — M7061 Trochanteric bursitis, right hip: Secondary | ICD-10-CM

## 2023-10-02 MED ORDER — TRIAMCINOLONE ACETONIDE 40 MG/ML IJ SUSP
40.0000 mg | INTRAMUSCULAR | Status: AC | PRN
  Administered 2023-10-02: 40 mg via INTRA_ARTICULAR

## 2023-10-02 MED ORDER — BUPIVACAINE HCL 0.25 % IJ SOLN
4.0000 mL | INTRAMUSCULAR | Status: AC | PRN
  Administered 2023-10-02: 4 mL via INTRA_ARTICULAR

## 2023-10-02 MED ORDER — LIDOCAINE HCL 2 % IJ SOLN
4.0000 mL | INTRAMUSCULAR | Status: AC | PRN
  Administered 2023-10-02: 4 mL

## 2023-10-02 NOTE — Progress Notes (Signed)
 Holly Cochran - 73 y.o. female MRN [id number]  Date of birth: 1950-09-02  Office Visit Note: Visit Date: 10/02/2023 PCP: Geofm Glade PARAS, MD Referred by: Geofm Glade PARAS, MD  Subjective: Chief Complaint  Patient presents with   Right Hip - Pain   HPI:  Holly Cochran is a 73 y.o. female who comes in today for planned repeat Right anesthetic greater trochanteric injection and right ischial bursa injection with fluoroscopic guidance.  The patient has failed conservative care including home exercise, medications, time and activity modification. Prior injection gave more than 50% relief for several months. This injection will be diagnostic and hopefully therapeutic.  Please see requesting physician notes for further details and justification. She is starting to get left greater trochanteric symptoms. Plan is to try stretching and Voltaren gel. Consider PT consultation.  Referring: Duwaine Pouch, FNP   ROS Otherwise per HPI.  Assessment & Plan: Visit Diagnoses:    ICD-10-CM   1. Greater trochanteric bursitis, right  M70.61 XR C-ARM NO REPORT    2. Ischial bursitis, right  M70.71 XR C-ARM NO REPORT      Plan: No additional findings.   Meds & Orders: No orders of the defined types were placed in this encounter.   Orders Placed This Encounter  Procedures   Large Joint Inj   Large Joint Inj   XR C-ARM NO REPORT    Follow-up: No follow-ups on file.   Procedures: Large Joint Inj: R hip joint on 10/02/2023 4:42 PM Indications: pain and diagnostic evaluation Details: 22 G 3.5 in needle, fluoroscopy-guided lateral approach  Arthrogram: No  Medications: 4 mL lidocaine  2 %; 4 mL bupivacaine  0.25 %; 40 mg triamcinolone  acetonide 40 MG/ML Outcome: tolerated well, no immediate complications  There was excellent flow of contrast outlined the greater trochanteric bursa without vascular uptake. Procedure, treatment alternatives, risks and benefits explained, specific risks  discussed. Consent was given by the patient. Immediately prior to procedure a time out was called to verify the correct patient, procedure, equipment, support staff and site/side marked as required. Patient was prepped and draped in the usual sterile fashion.    right ischial bursa on 10/02/2023 4:42 PM Indications: pain and diagnostic evaluation Details: 22 G 3.5 in needle, fluoroscopy-guided posterior approach  Arthrogram: No  Medications: 4 mL bupivacaine  0.25 %; 40 mg triamcinolone  acetonide 40 MG/ML Outcome: tolerated well, no immediate complications  There was excellent flow of contrast outlining the ischial bursa.  There was no vascular flow noted. Procedure, treatment alternatives, risks and benefits explained, specific risks discussed. Consent was given by the patient. Immediately prior to procedure a time out was called to verify the correct patient, procedure, equipment, support staff and site/side marked as required. Patient was prepped and draped in the usual sterile fashion.          Clinical History: CLINICAL DATA: Low back and left buttock pain for approximately 1 month. Left foot drop. No recent injury.  EXAM: MRI LUMBAR SPINE WITHOUT CONTRAST  TECHNIQUE: Multiplanar, multisequence MR imaging of the lumbar spine was performed. No intravenous contrast was administered.  COMPARISON: None Available.  FINDINGS: Segmentation: Standard.  Alignment: Normal.  Vertebrae: No fracture, evidence of discitis, or bone lesion. Scattered, small Schmorl's nodes noted.  Conus medullaris and cauda equina: Conus extends to the L1 level. Conus and cauda equina appear normal.  Paraspinal and other soft tissues: Negative.  Disc levels:  T11-12 is imaged in the sagittal plane only. There is a shallow central  and right paracentral protrusion but the central canal and foramina appear open.  T12-L1: Negative.  L1-2: Negative.  L2-3: Minimal disc bulge. No  stenosis.  L3-4: Shallow disc bulge with a small protrusion and associated annular fissure in the left foramen. No stenosis.  L4-5: Mild facet degenerative change and a shallow protrusion inferior aspect of the left foramen. No stenosis.  L5-S1: Negative.  IMPRESSION: 1. Shallow protrusions in the left foramina at L3-4 and L4-5 without neural impingement. 2. Shallow central and right paracentral protrusion at T11-12 without central canal or foraminal narrowing.   Electronically Signed By: Debby Prader M.D. On: 04/20/2023 09:37     Objective:  VS:  HT:    WT:   BMI:     BP:128/85  HR:97bpm  TEMP: ( )  RESP:  Physical Exam Vitals and nursing note reviewed.  Constitutional:      General: She is not in acute distress.    Appearance: Normal appearance. She is not ill-appearing.  HENT:     Head: Normocephalic and atraumatic.     Right Ear: External ear normal.     Left Ear: External ear normal.  Eyes:     Extraocular Movements: Extraocular movements intact.  Cardiovascular:     Rate and Rhythm: Normal rate.     Pulses: Normal pulses.  Pulmonary:     Effort: Pulmonary effort is normal. No respiratory distress.  Abdominal:     General: There is no distension.     Palpations: Abdomen is soft.  Musculoskeletal:        General: Tenderness present.     Cervical back: Neck supple.     Right lower leg: No edema.     Left lower leg: No edema.     Comments: Patient has good distal strength with pain over the greater trochanters.  No clonus or focal weakness.  Skin:    Findings: No erythema, lesion or rash.  Neurological:     General: No focal deficit present.     Mental Status: She is alert and oriented to person, place, and time.     Sensory: No sensory deficit.     Motor: No weakness or abnormal muscle tone.     Coordination: Coordination normal.  Psychiatric:        Mood and Affect: Mood normal.        Behavior: Behavior normal.      Imaging: XR C-ARM NO  REPORT Result Date: 10/02/2023 Please see Notes tab for imaging impression.

## 2023-10-02 NOTE — Progress Notes (Signed)
 Pain Scale   Average Pain 4 Patient advising she has hip pain bilaterally and walking helps with pain sitting increases pain.        +Driver, -BT, -Dye Allergies.

## 2023-10-03 ENCOUNTER — Encounter: Payer: Self-pay | Admitting: Internal Medicine

## 2023-10-03 NOTE — Progress Notes (Deleted)
    Subjective:    Patient ID: Holly Cochran, female    DOB: August 22, 1950, 72 y.o.   MRN: [id number]      HPI Holly Cochran is here for No chief complaint on file.   Lexapro , pravastatin , , prilosec, methocarb     Medications and allergies reviewed with patient and updated if appropriate.  Current Outpatient Medications on File Prior to Visit  Medication Sig Dispense Refill   Acetaminophen  (TYLENOL  ARTHRITIS PAIN PO) Take by mouth.     aspirin  81 MG EC tablet Take 1 tablet (81 mg total) by mouth daily. Swallow whole. 30 tablet 12   Cholecalciferol (VITAMIN D3) 25 MCG (1000 UT) CAPS Take 1,000 Units by mouth daily.     escitalopram  (LEXAPRO ) 20 MG tablet Take 1 tablet (20 mg total) by mouth daily. 90 tablet 1   levothyroxine  (SYNTHROID ) 125 MCG tablet TAKE ONE TABLET ONCE DAILY AND TAKE 1/2 TABLET TWO DAYS A WEEK 72 tablet 1   meloxicam  (MOBIC ) 15 MG tablet Take 1 tablet (15 mg total) by mouth daily as needed for pain. 90 tablet 1   methocarbamol  (ROBAXIN ) 500 MG tablet TAKE 1 TABLET EVERY 6 HOURS AS NEEDED FOR MUSCLE SPASM. 180 tablet 3   omega-3 acid ethyl esters (LOVAZA) 1 g capsule Take 1 g by mouth daily.     omeprazole  (PRILOSEC) 40 MG capsule TAKE ONE CAPSULE BY MOUTH ONCE DAILY 30 MINUTES BEFORE A MEAL 90 capsule 1   pravastatin  (PRAVACHOL ) 40 MG tablet TAKE ONE TABLET DAILY AT BEDTIME 90 tablet 1   Current Facility-Administered Medications on File Prior to Visit  Medication Dose Route Frequency Provider Last Rate Last Admin   denosumab  (PROLIA ) injection 60 mg  60 mg Subcutaneous Once Geofm Glade PARAS, MD        Review of Systems     Objective:  There were no vitals filed for this visit. BP Readings from Last 3 Encounters:  10/02/23 128/85  08/09/23 133/84  05/09/23 126/80   Wt Readings from Last 3 Encounters:  05/09/23 157 lb (71.2 kg)  04/24/23 155 lb (70.3 kg)  02/07/23 160 lb (72.6 kg)   There is no height or weight on file to calculate BMI.    Physical  Exam         Assessment & Plan:    See Problem List for Assessment and Plan of chronic medical problems.

## 2023-10-04 ENCOUNTER — Ambulatory Visit: Admitting: Internal Medicine

## 2023-10-04 DIAGNOSIS — H2512 Age-related nuclear cataract, left eye: Secondary | ICD-10-CM | POA: Diagnosis not present

## 2023-10-04 DIAGNOSIS — H524 Presbyopia: Secondary | ICD-10-CM | POA: Diagnosis not present

## 2023-10-08 NOTE — Progress Notes (Unsigned)
    Subjective:    Patient ID: Holly Cochran, female    DOB: August 22, 1950, 72 y.o.   MRN: [id number]      HPI Holly Cochran is here for No chief complaint on file.   Lexapro , pravastatin , , prilosec, methocarb     Medications and allergies reviewed with patient and updated if appropriate.  Current Outpatient Medications on File Prior to Visit  Medication Sig Dispense Refill   Acetaminophen  (TYLENOL  ARTHRITIS PAIN PO) Take by mouth.     aspirin  81 MG EC tablet Take 1 tablet (81 mg total) by mouth daily. Swallow whole. 30 tablet 12   Cholecalciferol (VITAMIN D3) 25 MCG (1000 UT) CAPS Take 1,000 Units by mouth daily.     escitalopram  (LEXAPRO ) 20 MG tablet Take 1 tablet (20 mg total) by mouth daily. 90 tablet 1   levothyroxine  (SYNTHROID ) 125 MCG tablet TAKE ONE TABLET ONCE DAILY AND TAKE 1/2 TABLET TWO DAYS A WEEK 72 tablet 1   meloxicam  (MOBIC ) 15 MG tablet Take 1 tablet (15 mg total) by mouth daily as needed for pain. 90 tablet 1   methocarbamol  (ROBAXIN ) 500 MG tablet TAKE 1 TABLET EVERY 6 HOURS AS NEEDED FOR MUSCLE SPASM. 180 tablet 3   omega-3 acid ethyl esters (LOVAZA) 1 g capsule Take 1 g by mouth daily.     omeprazole  (PRILOSEC) 40 MG capsule TAKE ONE CAPSULE BY MOUTH ONCE DAILY 30 MINUTES BEFORE A MEAL 90 capsule 1   pravastatin  (PRAVACHOL ) 40 MG tablet TAKE ONE TABLET DAILY AT BEDTIME 90 tablet 1   Current Facility-Administered Medications on File Prior to Visit  Medication Dose Route Frequency Provider Last Rate Last Admin   denosumab  (PROLIA ) injection 60 mg  60 mg Subcutaneous Once Geofm Glade PARAS, MD        Review of Systems     Objective:  There were no vitals filed for this visit. BP Readings from Last 3 Encounters:  10/02/23 128/85  08/09/23 133/84  05/09/23 126/80   Wt Readings from Last 3 Encounters:  05/09/23 157 lb (71.2 kg)  04/24/23 155 lb (70.3 kg)  02/07/23 160 lb (72.6 kg)   There is no height or weight on file to calculate BMI.    Physical  Exam         Assessment & Plan:    See Problem List for Assessment and Plan of chronic medical problems.

## 2023-10-09 ENCOUNTER — Telehealth: Payer: Self-pay

## 2023-10-09 ENCOUNTER — Ambulatory Visit: Admitting: Internal Medicine

## 2023-10-09 ENCOUNTER — Encounter: Payer: Self-pay | Admitting: Internal Medicine

## 2023-10-09 ENCOUNTER — Ambulatory Visit: Payer: Self-pay | Admitting: Internal Medicine

## 2023-10-09 VITALS — BP 110/70 | HR 60 | Temp 98.3°F | Ht 65.0 in | Wt 150.0 lb

## 2023-10-09 DIAGNOSIS — M810 Age-related osteoporosis without current pathological fracture: Secondary | ICD-10-CM | POA: Diagnosis not present

## 2023-10-09 DIAGNOSIS — E559 Vitamin D deficiency, unspecified: Secondary | ICD-10-CM | POA: Diagnosis not present

## 2023-10-09 DIAGNOSIS — R682 Dry mouth, unspecified: Secondary | ICD-10-CM

## 2023-10-09 DIAGNOSIS — R413 Other amnesia: Secondary | ICD-10-CM | POA: Insufficient documentation

## 2023-10-09 DIAGNOSIS — F32A Depression, unspecified: Secondary | ICD-10-CM | POA: Diagnosis not present

## 2023-10-09 DIAGNOSIS — E890 Postprocedural hypothyroidism: Secondary | ICD-10-CM

## 2023-10-09 DIAGNOSIS — F419 Anxiety disorder, unspecified: Secondary | ICD-10-CM

## 2023-10-09 LAB — VITAMIN D 25 HYDROXY (VIT D DEFICIENCY, FRACTURES): VITD: 41.18 ng/mL (ref 30.00–100.00)

## 2023-10-09 LAB — TSH: TSH: 0.96 u[IU]/mL (ref 0.35–5.50)

## 2023-10-09 LAB — VITAMIN B12: Vitamin B-12: 131 pg/mL — ABNORMAL LOW (ref 211–911)

## 2023-10-09 MED ORDER — DENOSUMAB 60 MG/ML ~~LOC~~ SOSY
60.0000 mg | PREFILLED_SYRINGE | Freq: Once | SUBCUTANEOUS | Status: AC
  Administered 2023-11-14: 60 mg via SUBCUTANEOUS

## 2023-10-09 MED ORDER — OMEPRAZOLE 40 MG PO CPDR: 40.0000 mg | DELAYED_RELEASE_CAPSULE | Freq: Every day | ORAL | Status: AC | PRN

## 2023-10-09 MED ORDER — ESCITALOPRAM OXALATE 20 MG PO TABS: 10.0000 mg | ORAL_TABLET | Freq: Every day | ORAL | Status: AC

## 2023-10-09 NOTE — Assessment & Plan Note (Signed)
 Has had some mild memory issues by her partner has noticed this and stressed that she needs to have this further evaluated which she agrees to Check TSH, B12 level, CBC, CMP Referral to [facility] neurology

## 2023-10-09 NOTE — Assessment & Plan Note (Signed)
 Chronic Taking vitamin D  daily, but did recently decrease her dose Check vitamin D  level

## 2023-10-09 NOTE — Telephone Encounter (Signed)
 Prolia  VOB initiated via [email or web address]  Next Prolia  inj DUE: 11/08/23

## 2023-10-09 NOTE — Assessment & Plan Note (Signed)
 New Noticed this a couple of months ago and it has gotten worse No obvious causes such as changes in medication, supplements Also notes dry eyes and dry nasal passageways In the past had an episode of dryness that did not last this long, but was moved out for Sjogren's at that time Has been chewing gum and that has helped Stressed increasing water is much as possible Will check ANA, SSA, SSB to rule out Sjogren's again-can consider referring to rheumatology Discussed her Lexapro  and pravastatin  could both be contributing to the dryness Anxiety and depression well-controlled-Will try decreasing Lexapro  to 10 mg daily to see if that helps and could eliminate completely

## 2023-10-09 NOTE — Patient Instructions (Addendum)
      Blood work was ordered.       Medications changes include :   start super B complex, decrease lexapro  to 10 mg     A referral was ordered for neurology and someone will call you to schedule an appointment.

## 2023-10-09 NOTE — Assessment & Plan Note (Signed)
 Chronic Well-controlled Currently taking Lexapro  20 mg daily This is likely contributing to some of her dry mouth Since she is doing so well we will try decreasing to 10 mg daily

## 2023-10-09 NOTE — Assessment & Plan Note (Signed)
 Chronic  Clinically euthyroid She is having some memory difficulties Check tsh and will titrate med dose if needed Currently taking levothyroxine  125 mcg 5 days a week, 60.25 mcg 2 days a week

## 2023-10-10 ENCOUNTER — Other Ambulatory Visit (HOSPITAL_COMMUNITY): Payer: Self-pay

## 2023-10-10 NOTE — Telephone Encounter (Signed)
 Pt ready for scheduling for PROLIA  on or after : 11/08/23  Option# 1: Buy/Bill (Office supplied medication)  Out-of-pocket cost due at time of clinic visit: $357  Number of injection/visits approved: 2  Primary: [employer] Prolia  co-insurance: 20% Admin fee co-insurance: 20%  Secondary: --- Prolia  co-insurance:  Admin fee co-insurance:   Medical Benefit Details: Date Benefits were checked: 10/10/23 Deductible: NO/ Coinsurance: 20%/ Admin Fee: 20%  Prior Auth: APPROVED PA# [id number] Expiration Date: 01/10/23-01/09/24   # of doses approved: 2 ----------------------------------------------------------------------- Option# 2- Med Obtained from pharmacy:  Pharmacy benefit: Copay $64 (Paid to pharmacy) Admin Fee: 20% (Pay at clinic)  Prior Auth: N/A PA# Expiration Date:   # of doses approved:   If patient wants fill through the pharmacy benefit please send prescription to: [facility], and include estimated need by date in rx notes. Pharmacy will ship medication directly to the office.  Patient NOT eligible for Prolia  Copay Card. Copay Card can make patient's cost as little as $25. Link to apply: [email or web address]://[email or web address]  ** This summary of benefits is an estimation of the patient's out-of-pocket cost. Exact cost may very based on individual plan coverage.

## 2023-10-10 NOTE — Telephone Encounter (Signed)
 SABRA

## 2023-10-11 ENCOUNTER — Ambulatory Visit (INDEPENDENT_AMBULATORY_CARE_PROVIDER_SITE_OTHER)

## 2023-10-11 DIAGNOSIS — E538 Deficiency of other specified B group vitamins: Secondary | ICD-10-CM | POA: Diagnosis not present

## 2023-10-11 LAB — ANTI-NUCLEAR AB-TITER (ANA TITER): ANA Titer 1: 1:40 {titer} — ABNORMAL HIGH

## 2023-10-11 LAB — SJOGRENS SYNDROME-A EXTRACTABLE NUCLEAR ANTIBODY: SSA (Ro) (ENA) Antibody, IgG: <1 AI

## 2023-10-11 LAB — SJOGRENS SYNDROME-B EXTRACTABLE NUCLEAR ANTIBODY: SSB (La) (ENA) Antibody, IgG: <1 AI

## 2023-10-11 LAB — ANA: Anti Nuclear Antibody (ANA): POSITIVE — AB

## 2023-10-11 MED ORDER — CYANOCOBALAMIN 1000 MCG/ML IJ SOLN
1000.0000 ug | Freq: Once | INTRAMUSCULAR | Status: AC
  Administered 2023-10-11: 1000 ug via INTRAMUSCULAR

## 2023-10-11 NOTE — Progress Notes (Signed)
 Patient here for her first weekly B12 injection per Dr. Geofm. B12 1000 mcg given in left IM and patient tolerated injection well today.

## 2023-10-15 ENCOUNTER — Encounter: Payer: Self-pay | Admitting: Internal Medicine

## 2023-10-15 DIAGNOSIS — L821 Other seborrheic keratosis: Secondary | ICD-10-CM | POA: Diagnosis not present

## 2023-10-15 DIAGNOSIS — D229 Melanocytic nevi, unspecified: Secondary | ICD-10-CM | POA: Diagnosis not present

## 2023-10-15 DIAGNOSIS — L578 Other skin changes due to chronic exposure to nonionizing radiation: Secondary | ICD-10-CM | POA: Diagnosis not present

## 2023-10-15 DIAGNOSIS — L570 Actinic keratosis: Secondary | ICD-10-CM | POA: Diagnosis not present

## 2023-10-15 DIAGNOSIS — B079 Viral wart, unspecified: Secondary | ICD-10-CM | POA: Diagnosis not present

## 2023-10-15 DIAGNOSIS — L814 Other melanin hyperpigmentation: Secondary | ICD-10-CM | POA: Diagnosis not present

## 2023-10-15 DIAGNOSIS — D1801 Hemangioma of skin and subcutaneous tissue: Secondary | ICD-10-CM | POA: Diagnosis not present

## 2023-10-15 DIAGNOSIS — D485 Neoplasm of uncertain behavior of skin: Secondary | ICD-10-CM | POA: Diagnosis not present

## 2023-10-18 ENCOUNTER — Ambulatory Visit: Admitting: Family Medicine

## 2023-10-18 ENCOUNTER — Ambulatory Visit: Payer: Self-pay

## 2023-10-18 ENCOUNTER — Encounter: Payer: Self-pay | Admitting: Family Medicine

## 2023-10-18 ENCOUNTER — Telehealth: Payer: Self-pay | Admitting: Internal Medicine

## 2023-10-18 VITALS — BP 120/82 | HR 102 | Temp 98.2°F | Ht 65.0 in | Wt 150.0 lb

## 2023-10-18 DIAGNOSIS — Z2009 Contact with and (suspected) exposure to other intestinal infectious diseases: Secondary | ICD-10-CM

## 2023-10-18 DIAGNOSIS — R197 Diarrhea, unspecified: Secondary | ICD-10-CM

## 2023-10-18 NOTE — Progress Notes (Signed)
   Acute Office Visit  Subjective:     Patient ID: Holly Cochran, female    DOB: 05/17/1950, 73 y.o.   MRN: [id number]  Chief Complaint  Patient presents with   Acute Visit    HPI  73 year old female presents for evaluation of increased frequency of bowel movements, states they are softer than usual. Reports that she is concerned about C. difficile because her partner is being tested for C. difficile currently. Denies abdominal cramping or pain, fever, chills. Does report that bowel movements have correlated with food and fluid intake today. Denies recent antibiotic use.  ROS Per HPI      Objective:    Ht 5' 5 (1.651 m)   LMP 01/09/2005   BMI 24.96 kg/m    Physical Exam Vitals and nursing note reviewed.  Constitutional:      General: She is not in acute distress.    Appearance: Normal appearance. She is normal weight.  HENT:     Head: Normocephalic and atraumatic.     Right Ear: External ear normal.     Left Ear: External ear normal.     Nose: Nose normal.     Mouth/Throat:     Mouth: Mucous membranes are moist.     Pharynx: Oropharynx is clear.  Eyes:     Extraocular Movements: Extraocular movements intact.     Pupils: Pupils are equal, round, and reactive to light.  Cardiovascular:     Rate and Rhythm: Normal rate and regular rhythm.     Pulses: Normal pulses.     Heart sounds: Normal heart sounds.  Pulmonary:     Effort: Pulmonary effort is normal. No respiratory distress.     Breath sounds: Normal breath sounds. No wheezing, rhonchi or rales.  Abdominal:     General: There is no distension.     Palpations: There is no mass.     Tenderness: There is no abdominal tenderness. There is no guarding or rebound.     Hernia: No hernia is present.  Musculoskeletal:        General: Normal range of motion.     Cervical back: Normal range of motion.     Right lower leg: No edema.     Left lower leg: No edema.  Lymphadenopathy:     Cervical: No cervical  adenopathy.  Neurological:     General: No focal deficit present.     Mental Status: She is alert and oriented to person, place, and time.  Psychiatric:        Mood and Affect: Mood normal.        Thought Content: Thought content normal.     No results found for any visits on 10/18/23.      Assessment & Plan:   Diarrhea, unspecified type -     GI Profile, Stool, PCR  Contact with and (suspected) exposure to other intestinal infectious diseases -     GI Profile, Stool, PCR      Orders Placed This Encounter  Procedures   GI Profile, Stool, PCR     No orders of the defined types were placed in this encounter.   Return if symptoms worsen or fail to improve.  Corean LITTIE Ku, FNP

## 2023-10-18 NOTE — Telephone Encounter (Signed)
 Patient seeing Holly Cochran today

## 2023-10-18 NOTE — Telephone Encounter (Signed)
 Message from [facility] F sent at 10/18/2023  8:48 AM EDT      Summary: Diarrhea/partner suspected of C-Diff    Reason for Triage: Patient states her partner is suspected of having C-Diff and she used the tube they gave to her partner for the stool sample and collected her own stool as she states she woke up with diarrhea today. She has no other symptoms but wants to know what steps to take to get this tested or what she needs to do.  Patient callback is [phone]

## 2023-10-18 NOTE — Telephone Encounter (Signed)
 FYI Only or Action Required?: FYI only for provider.  Patient was last seen in primary care on 10/09/2023 by Geofm Glade PARAS, MD.  Called Nurse Triage reporting Diarrhea.  Symptoms began today.  Interventions attempted: Nothing.  Symptoms are: gradually worsening.  Triage Disposition: See Physician Within 24 Hours  Patient/caregiver understands and will follow disposition?: Yes      Message from [facility] F sent at 10/18/2023  8:48 AM EDT  Summary: Diarrhea/partner suspected of C-Diff   Reason for Triage: Patient states her partner is suspected of having C-Diff and she used the tube they gave to her partner for the stool sample and collected her own stool as she states she woke up with diarrhea today. She has no other symptoms but wants to know what steps to take to get this tested or what she needs to do.  Patient callback is [phone]         Reason for Disposition  [1] MODERATE diarrhea (e.g., 4-6 times / day more than normal) AND [2] age > 70 years  Answer Assessment - Initial Assessment Questions Patient states she has a stool sample to bring to office today. Patient scheduled today in office to be seen for symptoms.    1. ONSET: When did the diarrhea begin?      This morning  2. VOMITING: Are you also vomiting? If Yes, ask: How many times in the past 24 hours?      Denies  3. ABDOMEN PAIN: Are you having any abdomen pain? If Yes, ask: What does it feel like? (e.g., crampy, dull, intermittent, constant)      Denies  4. ABDOMEN PAIN SEVERITY: If present, ask: How bad is the pain?  (e.g., Scale 1-10; mild, moderate, or severe)     Denies  5. EXPOSURE: Have you traveled to a foreign country recently? Have you been exposed to anyone with diarrhea? Could you have eaten any food that was spoiled?     Partner has possible cdiff  6. ANTIBIOTIC USE: Are you taking antibiotics now or have you taken antibiotics in the past 2 months?       Denies  7. OTHER  SYMPTOMS: Do you have any other symptoms? (e.g., fever, blood in stool)       Denies  Protocols used: Diarrhea-A-AH

## 2023-10-18 NOTE — Patient Instructions (Signed)
 We are checking labs today, will be in contact with any results that require further attention  Follow-up with me for new or worsening symptoms.

## 2023-10-19 ENCOUNTER — Ambulatory Visit (INDEPENDENT_AMBULATORY_CARE_PROVIDER_SITE_OTHER)

## 2023-10-19 ENCOUNTER — Encounter: Payer: Self-pay | Admitting: Internal Medicine

## 2023-10-19 DIAGNOSIS — E538 Deficiency of other specified B group vitamins: Secondary | ICD-10-CM | POA: Diagnosis not present

## 2023-10-19 MED ORDER — CYANOCOBALAMIN 1000 MCG/ML IJ SOLN
1000.0000 ug | Freq: Once | INTRAMUSCULAR | Status: AC
  Administered 2023-10-19: 1000 ug via INTRAMUSCULAR

## 2023-10-19 NOTE — Progress Notes (Signed)
Pt was given B12 injection with no complications.

## 2023-10-21 LAB — GI PROFILE, STOOL, PCR

## 2023-10-22 ENCOUNTER — Ambulatory Visit: Admitting: Internal Medicine

## 2023-10-22 ENCOUNTER — Encounter: Payer: Self-pay | Admitting: Internal Medicine

## 2023-10-22 ENCOUNTER — Ambulatory Visit: Payer: Self-pay | Admitting: Family Medicine

## 2023-10-22 VITALS — BP 126/80 | HR 71 | Temp 98.4°F | Ht 65.0 in | Wt 147.0 lb

## 2023-10-22 DIAGNOSIS — E890 Postprocedural hypothyroidism: Secondary | ICD-10-CM

## 2023-10-22 DIAGNOSIS — E538 Deficiency of other specified B group vitamins: Secondary | ICD-10-CM | POA: Insufficient documentation

## 2023-10-22 DIAGNOSIS — R109 Unspecified abdominal pain: Secondary | ICD-10-CM | POA: Insufficient documentation

## 2023-10-22 DIAGNOSIS — R5383 Other fatigue: Secondary | ICD-10-CM | POA: Insufficient documentation

## 2023-10-22 NOTE — Assessment & Plan Note (Addendum)
 Acute Having intermittent LLQ/groin pain -- more of a discomfort Having decreased appetite, nausea and some weight loss Having some increased soft stools and fatigue - one episode of vomiting  Stool cx with enteropathogenic ecoli cbc, cmp, tsh, amylase, lipase Check labs - if symptoms not improving will consider azithro

## 2023-10-22 NOTE — Assessment & Plan Note (Signed)
 New Getting B12 injection Advised to take 1000 mcg oral B12

## 2023-10-22 NOTE — Patient Instructions (Addendum)
      Blood work was ordered.       Medications changes include :   None

## 2023-10-22 NOTE — Assessment & Plan Note (Signed)
 Acute ? Related to infection R/o uti, cbc, cmp, tsh, amylase, lipase

## 2023-10-22 NOTE — Assessment & Plan Note (Signed)
 Chronic  Having increased fatigue - recent tsh was ok but will recheck it Check tsh and will titrate med dose if needed Currently taking levothyroxine  125 mcg 5 days a week, 60.25 mcg 2 days a week

## 2023-10-22 NOTE — Progress Notes (Signed)
 Subjective:    Patient ID: Holly Cochran, female    DOB: 30-May-1950, 73 y.o.   MRN: [id number]      HPI Georgana is here for  Chief Complaint  Patient presents with   Medical Management of Chronic Issues    B12 shots not working; Patient feels gittery today; Wants to go over GI panel   So tired  - everything is a struggle - [place] just to get it done.   10 days ago - went to a baby shower.  That night - vomited once but it was a lot.  After that started to get very tired.   She has not had diarrhea.  - going more than usual - soft stool.     Not hungry.  Nausea at times - no gerd.  Occasional discomfort in the left groin/ llq.     Medications and allergies reviewed with patient and updated if appropriate.  Current Outpatient Medications on File Prior to Visit  Medication Sig Dispense Refill   Acetaminophen  (TYLENOL  ARTHRITIS PAIN PO) Take by mouth.     aspirin  81 MG EC tablet Take 1 tablet (81 mg total) by mouth daily. Swallow whole. 30 tablet 12   Cholecalciferol (VITAMIN D3) 25 MCG (1000 UT) CAPS Take 1,000 Units by mouth daily.     escitalopram  (LEXAPRO ) 20 MG tablet Take 0.5 tablets (10 mg total) by mouth daily.     levothyroxine  (SYNTHROID ) 125 MCG tablet TAKE ONE TABLET ONCE DAILY AND TAKE 1/2 TABLET TWO DAYS A WEEK 72 tablet 1   meloxicam  (MOBIC ) 15 MG tablet Take 1 tablet (15 mg total) by mouth daily as needed for pain. 90 tablet 1   methocarbamol  (ROBAXIN ) 500 MG tablet TAKE 1 TABLET EVERY 6 HOURS AS NEEDED FOR MUSCLE SPASM. 180 tablet 3   omega-3 acid ethyl esters (LOVAZA) 1 g capsule Take 1 g by mouth daily.     omeprazole  (PRILOSEC) 40 MG capsule Take 1 capsule (40 mg total) by mouth daily as needed.     pravastatin  (PRAVACHOL ) 40 MG tablet TAKE ONE TABLET DAILY AT BEDTIME 90 tablet 1   prednisoLONE acetate (PRED FORTE) 1 % ophthalmic suspension USE AS DIRECTED IN RIGHT EYE ONE DROP THREE TIMES DAILY FOR  ONE WEEK, ONE DROP TWICE DAILY FOR ONE WEEK AND ONE DROP EVERY DAY FOR ONE WEEK.     Current Facility-Administered Medications on File Prior to Visit  Medication Dose Route Frequency Provider Last Rate Last Admin   denosumab  (PROLIA ) injection 60 mg  60 mg Subcutaneous Once Geofm Glade PARAS, MD       denosumab  (PROLIA ) injection 60 mg  60 mg Subcutaneous Once Geofm Glade PARAS, MD        Review of Systems  Constitutional:  Positive for fatigue. Negative for fever.  HENT:  Negative for congestion, ear pain, sinus pain and sore throat.   Respiratory:  Negative for cough, shortness of breath and wheezing.   Cardiovascular:  Negative for chest pain and palpitations.  Gastrointestinal:  Negative for diarrhea.  Genitourinary:  Negative for dysuria, frequency and hematuria.  Neurological:  Negative for light-headedness and headaches.  Psychiatric/Behavioral:  Negative for sleep disturbance.        Objective:   Vitals:   10/22/23 1543  BP: 126/80  Pulse: 71  Temp: 98.4 F (36.9 C)  SpO2: 98%   BP Readings from Last 3 Encounters:  10/22/23 126/80  10/18/23 120/82  10/09/23 110/70   Wt Readings from Last 3 Encounters:  10/22/23 147 lb (66.7 kg)  10/18/23 150 lb (68 kg)  10/09/23 150 lb (68 kg)   Body mass index is 24.46 kg/m.    Physical Exam Constitutional:      General: She is not in acute distress.    Appearance: Normal appearance. She is not ill-appearing.  HENT:     Head: Normocephalic and atraumatic.  Eyes:     Conjunctiva/sclera: Conjunctivae normal.  Cardiovascular:     Rate and Rhythm: Normal rate and regular rhythm.  Pulmonary:     Effort: Pulmonary effort is normal.     Breath sounds: Normal breath sounds.  Abdominal:     General: There is no distension.     Palpations: Abdomen is soft. There is no mass.     Tenderness: There is no abdominal tenderness.  Musculoskeletal:     Cervical back: Neck supple. No tenderness.     Right lower leg: No edema.     Left  lower leg: No edema.  Lymphadenopathy:     Cervical: No cervical adenopathy.  Skin:    General: Skin is warm and dry.  Neurological:     Mental Status: She is alert.            Assessment & Plan:    See Problem List for Assessment and Plan of chronic medical problems.

## 2023-10-23 ENCOUNTER — Ambulatory Visit: Payer: Self-pay | Admitting: Internal Medicine

## 2023-10-23 LAB — CBC WITH DIFFERENTIAL/PLATELET
Basophils Absolute: 0.1 K/uL (ref 0.0–0.1)
Basophils Relative: 1.3 % (ref 0.0–3.0)
Eosinophils Absolute: 0.1 K/uL (ref 0.0–0.7)
Eosinophils Relative: 0.8 % (ref 0.0–5.0)
HCT: 43.1 % (ref 36.0–46.0)
Hemoglobin: 14.5 g/dL (ref 12.0–15.0)
Lymphocytes Relative: 17.1 % (ref 12.0–46.0)
Lymphs Abs: 1.3 K/uL (ref 0.7–4.0)
MCHC: 33.7 g/dL (ref 30.0–36.0)
MCV: 96 fl (ref 78.0–100.0)
Monocytes Absolute: 0.8 K/uL (ref 0.1–1.0)
Monocytes Relative: 9.9 % (ref 3.0–12.0)
Neutro Abs: 5.5 K/uL (ref 1.4–7.7)
Neutrophils Relative %: 70.9 % (ref 43.0–77.0)
Platelets: 375 K/uL (ref 150.0–400.0)
RBC: 4.49 Mil/uL (ref 3.87–5.11)
RDW: 12.5 % (ref 11.5–15.5)
WBC: 7.8 K/uL (ref 4.0–10.5)

## 2023-10-23 LAB — LIPASE: Lipase: 36 U/L (ref 11.0–59.0)

## 2023-10-23 LAB — COMPREHENSIVE METABOLIC PANEL WITH GFR
ALT: 25 U/L (ref 0–35)
AST: 26 U/L (ref 0–37)
Albumin: 4.3 g/dL (ref 3.5–5.2)
Alkaline Phosphatase: 41 U/L (ref 39–117)
BUN: 9 mg/dL (ref 6–23)
CO2: 29 meq/L (ref 19–32)
Calcium: 9.2 mg/dL (ref 8.4–10.5)
Chloride: 103 meq/L (ref 96–112)
Creatinine, Ser: 0.68 mg/dL (ref 0.40–1.20)
GFR: 87.27 mL/min (ref >60.00)
Glucose, Bld: 92 mg/dL (ref 70–99)
Potassium: 3.8 meq/L (ref 3.5–5.1)
Sodium: 138 meq/L (ref 135–145)
Total Bilirubin: 0.7 mg/dL (ref 0.2–1.2)
Total Protein: 7 g/dL (ref 6.0–8.3)

## 2023-10-23 LAB — TSH: TSH: 3.37 u[IU]/mL (ref 0.35–5.50)

## 2023-10-23 LAB — AMYLASE: Amylase: 45 U/L (ref 27–131)

## 2023-10-23 MED ORDER — AZITHROMYCIN 500 MG PO TABS: 500.0000 mg | ORAL_TABLET | Freq: Every day | ORAL | 0 refills | Status: AC

## 2023-10-24 ENCOUNTER — Telehealth: Payer: Self-pay

## 2023-10-25 ENCOUNTER — Ambulatory Visit

## 2023-10-26 ENCOUNTER — Ambulatory Visit

## 2023-10-29 ENCOUNTER — Ambulatory Visit (INDEPENDENT_AMBULATORY_CARE_PROVIDER_SITE_OTHER)

## 2023-10-29 DIAGNOSIS — E538 Deficiency of other specified B group vitamins: Secondary | ICD-10-CM

## 2023-10-29 MED ORDER — CYANOCOBALAMIN 1000 MCG/ML IJ SOLN
1000.0000 ug | Freq: Once | INTRAMUSCULAR | Status: AC
  Administered 2023-10-29: 1000 ug via INTRAMUSCULAR

## 2023-10-29 NOTE — Progress Notes (Signed)
 Pt was given 3rd of 4th B12 injection w/o any complications.

## 2023-10-30 ENCOUNTER — Ambulatory Visit: Admitting: Internal Medicine

## 2023-11-02 ENCOUNTER — Encounter: Payer: Self-pay | Admitting: Physician Assistant

## 2023-11-02 ENCOUNTER — Ambulatory Visit

## 2023-11-05 ENCOUNTER — Ambulatory Visit

## 2023-11-05 DIAGNOSIS — E538 Deficiency of other specified B group vitamins: Secondary | ICD-10-CM | POA: Diagnosis not present

## 2023-11-05 MED ORDER — CYANOCOBALAMIN 1000 MCG/ML IJ SOLN
1000.0000 ug | Freq: Once | INTRAMUSCULAR | Status: AC
  Administered 2023-11-05: 1000 ug via INTRAMUSCULAR

## 2023-11-05 NOTE — Progress Notes (Signed)
Pt was given B12 injection with no complications.

## 2023-11-07 ENCOUNTER — Encounter: Payer: Self-pay | Admitting: Internal Medicine

## 2023-11-07 NOTE — Progress Notes (Unsigned)
      Subjective:    Patient ID: Holly Cochran, female    DOB: 1950-10-22, 73 y.o.   MRN: [id number]     HPI Holly Cochran is here for follow up of her chronic medical problems.  Had blood work done  Has had for B12 injections in our office-once a week for the past 4 weeks  Medications and allergies reviewed with patient and updated if appropriate.  Current Outpatient Medications on File Prior to Visit  Medication Sig Dispense Refill   Acetaminophen  (TYLENOL  ARTHRITIS PAIN PO) Take by mouth.     aspirin  81 MG EC tablet Take 1 tablet (81 mg total) by mouth daily. Swallow whole. 30 tablet 12   Cholecalciferol (VITAMIN D3) 25 MCG (1000 UT) CAPS Take 1,000 Units by mouth daily.     escitalopram  (LEXAPRO ) 20 MG tablet Take 0.5 tablets (10 mg total) by mouth daily.     levothyroxine  (SYNTHROID ) 125 MCG tablet TAKE ONE TABLET ONCE DAILY AND TAKE 1/2 TABLET TWO DAYS A WEEK 72 tablet 1   meloxicam  (MOBIC ) 15 MG tablet Take 1 tablet (15 mg total) by mouth daily as needed for pain. 90 tablet 1   methocarbamol  (ROBAXIN ) 500 MG tablet TAKE 1 TABLET EVERY 6 HOURS AS NEEDED FOR MUSCLE SPASM. 180 tablet 3   omega-3 acid ethyl esters (LOVAZA) 1 g capsule Take 1 g by mouth daily.     omeprazole  (PRILOSEC) 40 MG capsule Take 1 capsule (40 mg total) by mouth daily as needed.     pravastatin  (PRAVACHOL ) 40 MG tablet TAKE ONE TABLET DAILY AT BEDTIME 90 tablet 1   Current Facility-Administered Medications on File Prior to Visit  Medication Dose Route Frequency Provider Last Rate Last Admin   denosumab  (PROLIA ) injection 60 mg  60 mg Subcutaneous Once Geofm Glade PARAS, MD       denosumab  (PROLIA ) injection 60 mg  60 mg Subcutaneous Once Geofm Glade PARAS, MD         Review of Systems     Objective:  There were no vitals filed for this visit. BP Readings from Last 3 Encounters:  10/22/23 126/80  10/18/23 120/82  10/09/23 110/70   Wt Readings from Last 3 Encounters:  10/22/23 147 lb (66.7 kg)   10/18/23 150 lb (68 kg)  10/09/23 150 lb (68 kg)   There is no height or weight on file to calculate BMI.    Physical Exam     Lab Results  Component Value Date   WBC 7.8 10/22/2023   HGB 14.5 10/22/2023   HCT 43.1 10/22/2023   PLT 375.0 10/22/2023   GLUCOSE 92 10/22/2023   CHOL 125 05/09/2023   TRIG 114.0 05/09/2023   HDL 35.90 (L) 05/09/2023   LDLCALC 66 05/09/2023   ALT 25 10/22/2023   AST 26 10/22/2023   NA 138 10/22/2023   K 3.8 10/22/2023   CL 103 10/22/2023   CREATININE 0.68 10/22/2023   BUN 9 10/22/2023   CO2 29 10/22/2023   TSH 3.37 10/22/2023   HGBA1C 5.6 05/09/2023     Assessment & Plan:    See Problem List for Assessment and Plan of chronic medical problems.

## 2023-11-07 NOTE — Patient Instructions (Addendum)
   B12 injection today    Medications changes include :   None  For the constipation -  Stool softener Docusate 1-3 pills a day Probiotics Metamucil fiber or Benefiber miralax  - which you can take daily if needed Smooth move tea Prunes, prune juice, dates or raisons Magnesium  glycinate supplement over the counter     Return in about 6 months (around 05/12/2024) for Physical Exam.

## 2023-11-08 ENCOUNTER — Other Ambulatory Visit: Payer: Self-pay

## 2023-11-08 ENCOUNTER — Ambulatory Visit: Admitting: Internal Medicine

## 2023-11-08 ENCOUNTER — Ambulatory Visit

## 2023-11-08 VITALS — BP 130/80 | HR 79 | Temp 98.5°F | Ht 65.0 in | Wt 147.0 lb

## 2023-11-08 DIAGNOSIS — M810 Age-related osteoporosis without current pathological fracture: Secondary | ICD-10-CM | POA: Diagnosis not present

## 2023-11-08 DIAGNOSIS — E559 Vitamin D deficiency, unspecified: Secondary | ICD-10-CM | POA: Diagnosis not present

## 2023-11-08 DIAGNOSIS — F419 Anxiety disorder, unspecified: Secondary | ICD-10-CM

## 2023-11-08 DIAGNOSIS — F32A Depression, unspecified: Secondary | ICD-10-CM

## 2023-11-08 DIAGNOSIS — E538 Deficiency of other specified B group vitamins: Secondary | ICD-10-CM | POA: Diagnosis not present

## 2023-11-08 DIAGNOSIS — E890 Postprocedural hypothyroidism: Secondary | ICD-10-CM

## 2023-11-08 DIAGNOSIS — E7849 Other hyperlipidemia: Secondary | ICD-10-CM | POA: Diagnosis not present

## 2023-11-08 DIAGNOSIS — R7303 Prediabetes: Secondary | ICD-10-CM

## 2023-11-08 MED ORDER — CYANOCOBALAMIN 1000 MCG/ML IJ SOLN
1000.0000 ug | Freq: Once | INTRAMUSCULAR | Status: AC
  Administered 2023-11-08: 1000 ug via INTRAMUSCULAR

## 2023-11-08 MED ORDER — DENOSUMAB 60 MG/ML ~~LOC~~ SOSY
60.0000 mg | PREFILLED_SYRINGE | Freq: Once | SUBCUTANEOUS | 0 refills | Status: AC
  Filled 2023-11-08 (×2): qty 1, 180d supply, fill #0
  Filled 2023-11-08: qty 1, 1d supply, fill #0

## 2023-11-08 NOTE — Assessment & Plan Note (Signed)
 Chronic Lab Results  Component Value Date   HGBA1C 5.6 05/09/2023   Low sugar / carb diet Stressed regular exercise

## 2023-11-08 NOTE — Progress Notes (Signed)
 Specialty Pharmacy Initial Fill Coordination Note  Holly Cochran is a 73 y.o. female contacted today regarding initial fill of specialty medication(s) Denosumab  (PROLIA )   Patient requested Courier to Provider Office   Delivery date: 11/09/23   Verified address: [facility] [address]   Medication will be filled on: 11/08/23   Patient is aware of $64 copayment.

## 2023-11-08 NOTE — Progress Notes (Signed)
 Pharmacy Patient Advocate Encounter  Insurance verification completed.   The patient is insured through [employer]   Ran test claim for Prolia. Co-pay is $64.  This test claim was processed through [facility]- copay amounts may vary at other pharmacies due to pharmacy/plan contracts, or as the patient moves through the different stages of their insurance plan.

## 2023-11-08 NOTE — Assessment & Plan Note (Signed)
 Chronic Last vitamin D  Lab Results  Component Value Date   VD25OH 41.18 10/09/2023   Continue vitamin D  supplementation

## 2023-11-08 NOTE — Assessment & Plan Note (Addendum)
 Chronic DEXA up-to-date Fosamax  for 5 years-started 2021-stopped 2024 Started Prolia  10/09/2022 Continue Prolia  every 6 months Continue calcium and vitamin D  supplementation Continue regular exercise

## 2023-11-08 NOTE — Assessment & Plan Note (Signed)
 Chronic Regular exercise and healthy diet encouraged LDL well-controlled Lab Results  Component Value Date   LDLCALC 66 05/09/2023   Continue pravastatin  40 mg daily

## 2023-11-08 NOTE — Assessment & Plan Note (Addendum)
 Recently diagnosed Lab Results  Component Value Date   VITAMINB12 131 (L) 10/09/2023   Has done 4 B12 injections over the past month B12 injection today - hoping to get the level up and stabilized quickly Will do B12 injections monthly for approximately 6 months Continue oral B12 1000 mcg daily Will need B12 supplementation for the rest of her life

## 2023-11-08 NOTE — Assessment & Plan Note (Addendum)
 Chronic  Lab Results  Component Value Date   TSH 3.37 10/22/2023   TSH in normal range Currently taking levothyroxine  125 mcg 5 days a week, 60.25 mcg 2 days a week

## 2023-11-08 NOTE — Assessment & Plan Note (Signed)
 Chronic Well-controlled Decrease dose approximately 1 month ago Currently taking Lexapro  10 mg daily

## 2023-11-12 ENCOUNTER — Encounter: Payer: Self-pay | Admitting: Radiology

## 2023-11-14 ENCOUNTER — Ambulatory Visit

## 2023-11-14 DIAGNOSIS — M810 Age-related osteoporosis without current pathological fracture: Secondary | ICD-10-CM | POA: Diagnosis not present

## 2023-11-14 IMAGING — DX DG SHOULDER 2+V*L*
3 series · 3 of 3 positions shown · non-contrast
Comparison: None.

CLINICAL DATA: Left shoulder pain. Two years ago fall with fracture
left elbow.

EXAM:
LEFT SHOULDER - 2+ VIEW

[shoulder ap (1 of 2)]
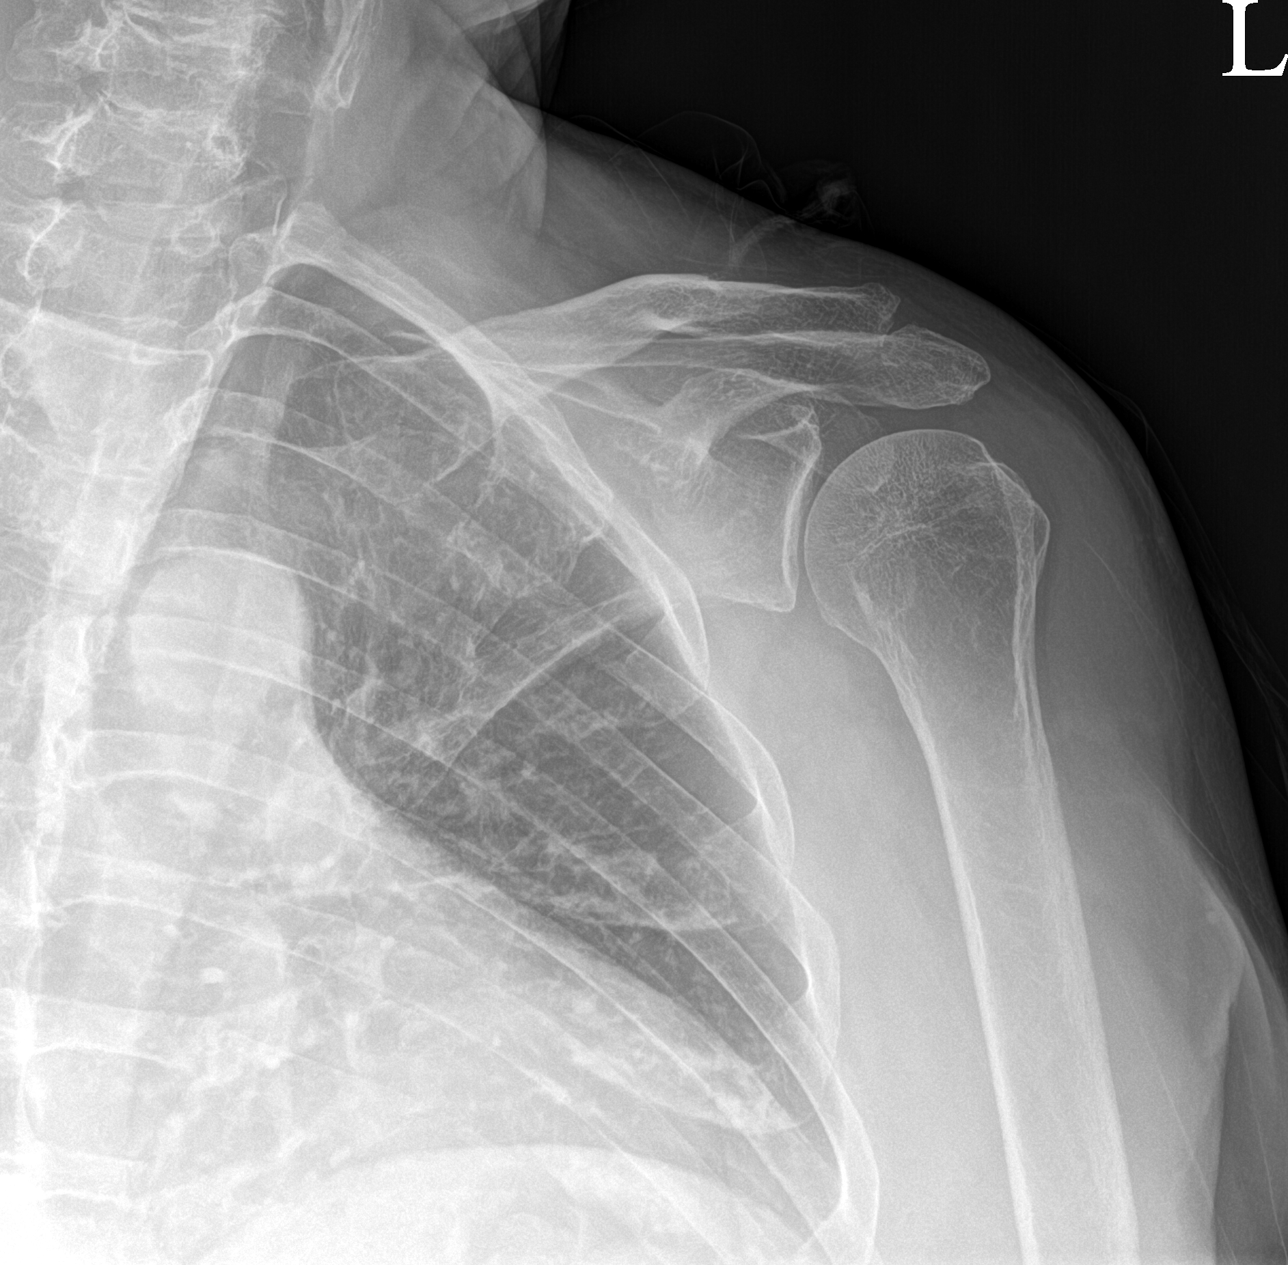

[shoulder ap (2 of 2)]
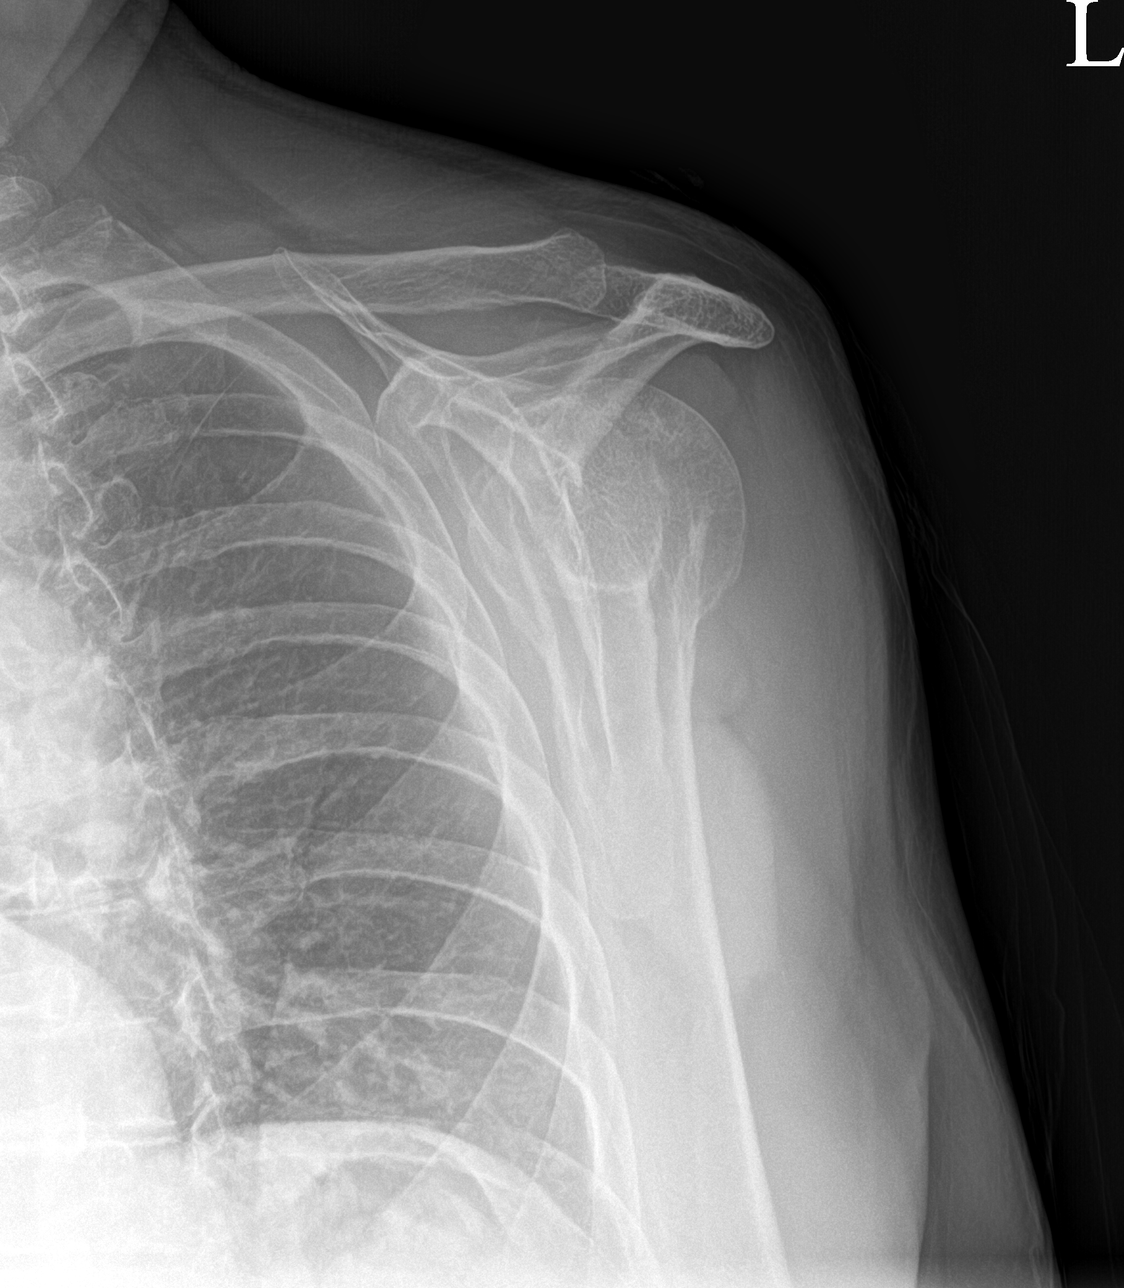

[shoulder axial]
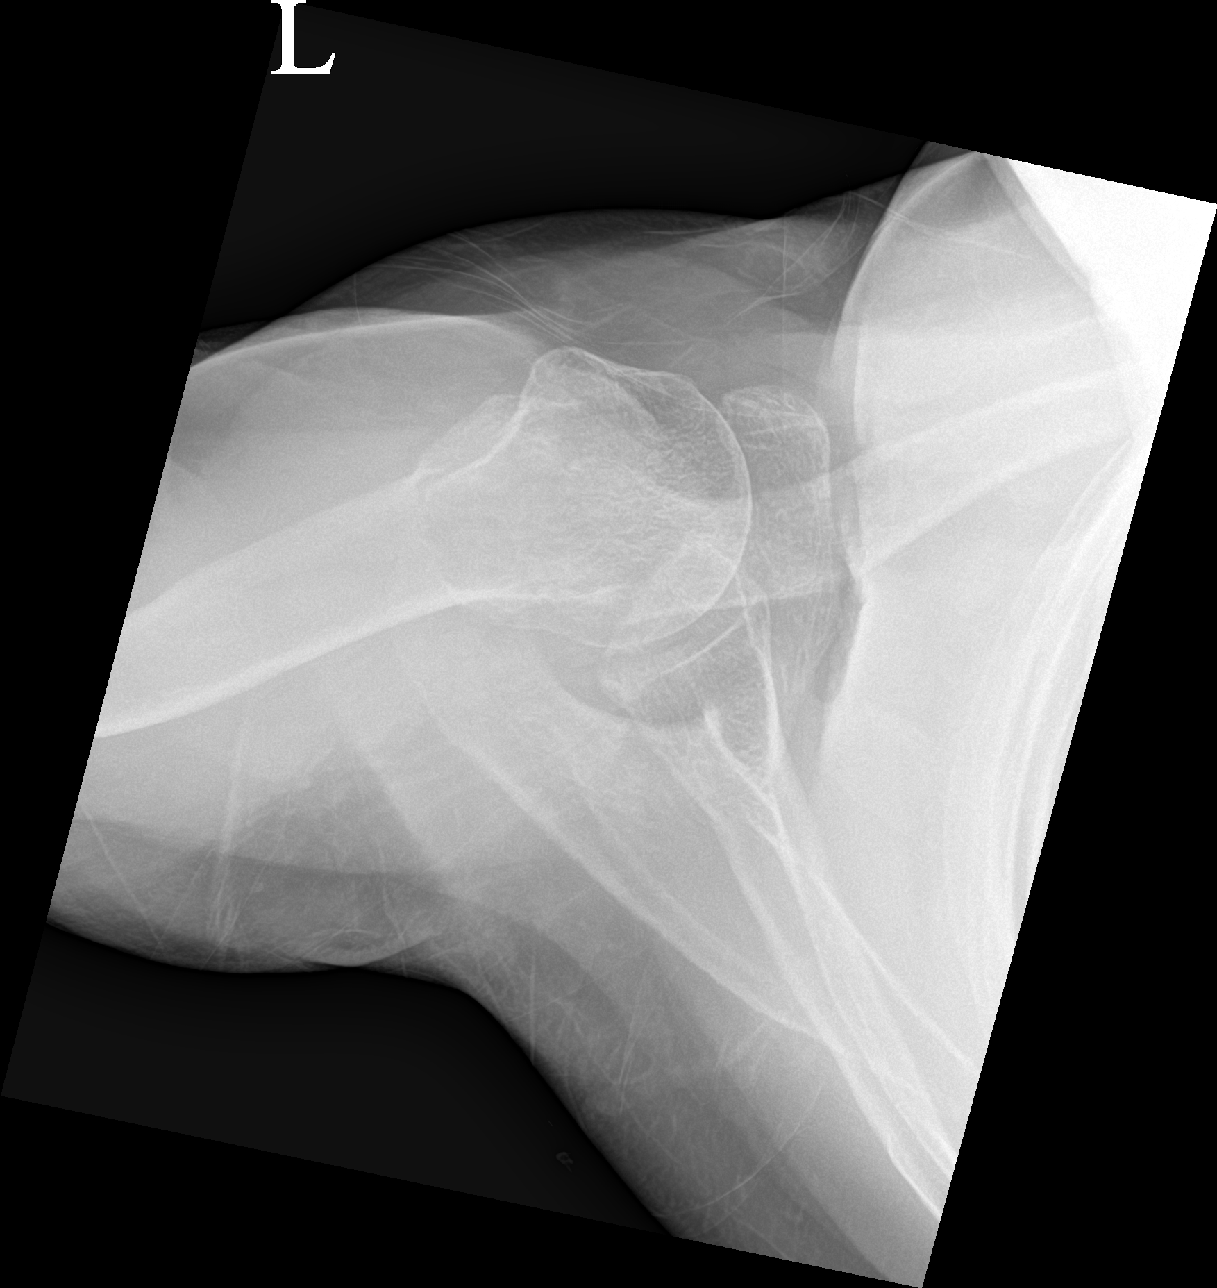

[3 of 3 positions shown; findings below may reference images not displayed]

FINDINGS: There is diffuse decreased bone mineralization. Mild
acromioclavicular joint space narrowing and peripheral osteophytosis
degenerative change. Mild posterior glenoid degenerative
osteophytosis. No acute fracture is seen. No dislocation.
IMPRESSION: Mild acromioclavicular osteoarthritis.  No acute fracture.

## 2023-11-14 NOTE — Progress Notes (Signed)
Prolia given w/o complications 

## 2023-11-28 DIAGNOSIS — H25812 Combined forms of age-related cataract, left eye: Secondary | ICD-10-CM | POA: Diagnosis not present

## 2023-11-28 DIAGNOSIS — H2512 Age-related nuclear cataract, left eye: Secondary | ICD-10-CM | POA: Diagnosis not present

## 2023-11-30 ENCOUNTER — Encounter: Payer: Self-pay | Admitting: Internal Medicine

## 2023-12-10 ENCOUNTER — Ambulatory Visit

## 2023-12-10 DIAGNOSIS — E538 Deficiency of other specified B group vitamins: Secondary | ICD-10-CM

## 2023-12-10 MED ORDER — CYANOCOBALAMIN 1000 MCG/ML IJ SOLN
1000.0000 ug | Freq: Once | INTRAMUSCULAR | Status: AC
  Administered 2023-12-10: 1000 ug via INTRAMUSCULAR

## 2023-12-10 NOTE — Progress Notes (Signed)
 After obtaining consent, and per orders of Dr. Lawerance Bach, injection of B12 given by Ferdie Ping. Patient instructed to report any adverse reaction to me immediately.

## 2023-12-12 NOTE — Telephone Encounter (Signed)
 Completed.

## 2023-12-24 ENCOUNTER — Other Ambulatory Visit: Payer: Self-pay | Admitting: Internal Medicine

## 2023-12-24 DIAGNOSIS — E890 Postprocedural hypothyroidism: Secondary | ICD-10-CM

## 2024-01-11 ENCOUNTER — Ambulatory Visit
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
## 2024-01-14 ENCOUNTER — Ambulatory Visit (INDEPENDENT_AMBULATORY_CARE_PROVIDER_SITE_OTHER)

## 2024-01-14 ENCOUNTER — Ambulatory Visit (INDEPENDENT_AMBULATORY_CARE_PROVIDER_SITE_OTHER): Payer: Self-pay | Admitting: Physician Assistant

## 2024-01-14 ENCOUNTER — Encounter: Payer: Self-pay | Admitting: Physician Assistant

## 2024-01-14 ENCOUNTER — Ambulatory Visit

## 2024-01-14 VITALS — BP 128/81 | HR 73 | Ht 65.0 in | Wt 154.0 lb

## 2024-01-14 DIAGNOSIS — E538 Deficiency of other specified B group vitamins: Secondary | ICD-10-CM | POA: Diagnosis not present

## 2024-01-14 DIAGNOSIS — R413 Other amnesia: Secondary | ICD-10-CM | POA: Diagnosis not present

## 2024-01-14 MED ORDER — CYANOCOBALAMIN 1000 MCG/ML IJ SOLN
1000.0000 ug | Freq: Once | INTRAMUSCULAR | Status: AC
  Administered 2024-01-14: 1000 ug via INTRAMUSCULAR

## 2024-01-14 NOTE — Progress Notes (Signed)
 After obtaining consent, and per orders of Dr. Lawerance Bach, injection of B12 given by Ferdie Ping. Patient instructed to remain in clinic for 20 minutes afterwards, and to report any adverse reaction to me immediately.

## 2024-01-14 NOTE — Patient Instructions (Signed)
 It was a pleasure to see you today at our office.   Recommendations:  Neurocognitive evaluation at our office   MRI of the brain, the radiology office will call you to arrange you appointment   Follow up  pending on the above results      [email or web address]   RECOMMENDATIONS FOR ALL PATIENTS WITH MEMORY PROBLEMS: 1. Continue to exercise (Recommend 30 minutes of walking everyday, or 3 hours every week) 2. Increase social interactions - continue going to [place] and enjoy social gatherings with friends and family 3. Eat healthy, avoid fried foods and eat more fruits and vegetables 4. Maintain adequate blood pressure, blood sugar, and blood cholesterol level. Reducing the risk of stroke and cardiovascular disease also helps promoting better memory. 5. Avoid stressful situations. Live a simple life and avoid aggravations. Organize your time and prepare for the next day in anticipation. 6. Sleep well, avoid any interruptions of sleep and avoid any distractions in the bedroom that may interfere with adequate sleep quality 7. Avoid sugar, avoid sweets as there is a strong link between excessive sugar intake, diabetes, and cognitive impairment We discussed the Mediterranean diet, which has been shown to help patients reduce the risk of progressive memory disorders and reduces cardiovascular risk. This includes eating fish, eat fruits and green leafy vegetables, nuts like almonds and hazelnuts, walnuts, and also use olive oil. Avoid fast foods and fried foods as much as possible. Avoid sweets and sugar as sugar use has been linked to worsening of memory function.  There is always a concern of gradual progression of memory problems. If this is the case, then we may need to adjust level of care according to patient needs. Support, both to the patient and caregiver, should then be put into place.      You have been referred for a  neuropsychological evaluation (i.e., evaluation of memory and thinking abilities). Please bring someone with you to this appointment if possible, as it is helpful for the doctor to hear from both you and another adult who knows you well. Please bring eyeglasses and hearing aids if you wear them.    The evaluation will take approximately 3 hours and has two parts:   The first part is a clinical interview with the neuropsychologist (Dr. Richie or Dr. Gayland). During the interview, the neuropsychologist will speak with you and the individual you brought to the appointment.    The second part of the evaluation is testing with the doctor's technician Neal or Luke). During the testing, the technician will ask you to remember different types of material, solve problems, and answer some questionnaires. Your family member will not be present for this portion of the evaluation.   Please note: We must reserve several hours of the neuropsychologist's time and the psychometrician's time for your evaluation appointment. As such, there is a No-Show fee of $100. If you are unable to attend any of your appointments, please contact our office as soon as possible to reschedule.      DRIVING: Regarding driving, in patients with progressive memory problems, driving will be impaired. We advise to have someone else do the driving if trouble finding directions or if minor accidents are reported. Independent driving assessment is available to determine safety of driving.   If you are interested in the driving assessment, you can contact the following:  [facility] [employer] [facility] in [place] [phone]  [facility] [phone]  [facility] [phone]  [facility] [phone] or [phone]  FALL PRECAUTIONS: Be cautious when walking. Scan the area for obstacles that may increase the risk of trips and falls. When getting up in the mornings, sit up at the edge of the bed for a  few minutes before getting out of bed. Consider elevating the bed at the head end to avoid drop of blood pressure when getting up. Walk always in a well-lit room (use night lights in the walls). Avoid area rugs or power cords from appliances in the middle of the walkways. Use a walker or a cane if necessary and consider physical therapy for balance exercise. Get your eyesight checked regularly.  FINANCIAL OVERSIGHT: Supervision, especially oversight when making financial decisions or transactions is also recommended.  HOME SAFETY: Consider the safety of the kitchen when operating appliances like stoves, microwave oven, and blender. Consider having supervision and share cooking responsibilities until no longer able to participate in those. Accidents with firearms and other hazards in the house should be identified and addressed as well.   ABILITY TO BE LEFT ALONE: If patient is unable to contact 911 operator, consider using LifeLine, or when the need is there, arrange for someone to stay with patients. Smoking is a fire hazard, consider supervision or cessation. Risk of wandering should be assessed by caregiver and if detected at any point, supervision and safe proof recommendations should be instituted.  MEDICATION SUPERVISION: Inability to self-administer medication needs to be constantly addressed. Implement a mechanism to ensure safe administration of the medications.      Mediterranean Diet A Mediterranean diet refers to food and lifestyle choices that are based on the traditions of countries located on the [employer] [place]. This way of eating has been shown to help prevent certain conditions and improve outcomes for people who have chronic diseases, like kidney disease and heart disease. What are tips for following this plan? Lifestyle  Cook and eat meals together with your family, when possible. Drink enough fluid to keep your urine clear or pale yellow. Be physically active every day.  This includes: Aerobic exercise like running or swimming. Leisure activities like gardening, walking, or housework. Get 7-8 hours of sleep each night. If recommended by your health care provider, drink red wine in moderation. This means 1 glass a day for nonpregnant women and 2 glasses a day for men. A glass of wine equals 5 oz (150 mL). Reading food labels  Check the serving size of packaged foods. For foods such as rice and pasta, the serving size refers to the amount of cooked product, not dry. Check the total fat in packaged foods. Avoid foods that have saturated fat or trans fats. Check the ingredients list for added sugars, such as corn syrup. Shopping  At the grocery store, buy most of your food from the areas near the walls of the store. This includes: Fresh fruits and vegetables (produce). Grains, beans, nuts, and seeds. Some of these may be available in unpackaged forms or large amounts (in bulk). Fresh seafood. Poultry and eggs. Low-fat dairy products. Buy whole ingredients instead of prepackaged foods. Buy fresh fruits and vegetables in-season from local farmers markets. Buy frozen fruits and vegetables in resealable bags. If you do not have access to quality fresh seafood, buy precooked frozen shrimp or canned fish, such as tuna, salmon, or sardines. Buy small amounts of raw or cooked vegetables, salads, or olives from the deli or salad bar at your store. Stock your pantry so you always have certain foods on hand, such as olive oil,  canned tuna, canned tomatoes, rice, pasta, and beans. Cooking  Cook foods with extra-virgin olive oil instead of using butter or other vegetable oils. Have meat as a side dish, and have vegetables or grains as your main dish. This means having meat in small portions or adding small amounts of meat to foods like pasta or stew. Use beans or vegetables instead of meat in common dishes like chili or lasagna. Experiment with different cooking methods.  Try roasting or broiling vegetables instead of steaming or sauteing them. Add frozen vegetables to soups, stews, pasta, or rice. Add nuts or seeds for added healthy fat at each meal. You can add these to yogurt, salads, or vegetable dishes. Marinate fish or vegetables using olive oil, lemon juice, garlic, and fresh herbs. Meal planning  Plan to eat 1 vegetarian meal one day each week. Try to work up to 2 vegetarian meals, if possible. Eat seafood 2 or more times a week. Have healthy snacks readily available, such as: Vegetable sticks with hummus. Greek yogurt. Fruit and nut trail mix. Eat balanced meals throughout the week. This includes: Fruit: 2-3 servings a day Vegetables: 4-5 servings a day Low-fat dairy: 2 servings a day Fish, poultry, or lean meat: 1 serving a day Beans and legumes: 2 or more servings a week Nuts and seeds: 1-2 servings a day Whole grains: 6-8 servings a day Extra-virgin olive oil: 3-4 servings a day Limit red meat and sweets to only a few servings a month What are my food choices? Mediterranean diet Recommended Grains: Whole-grain pasta. Brown rice. Bulgar wheat. Polenta. Couscous. Whole-wheat bread. [place] Madeira. Vegetables: Artichokes. Beets. Broccoli. Cabbage. Carrots. Eggplant. Green beans. Chard. Kale. Spinach. Onions. Leeks. Peas. Squash. Tomatoes. Peppers. Radishes. Fruits: Apples. Apricots. Avocado. Berries. Bananas. Cherries. Dates. Figs. Grapes. Lemons. Melon. Oranges. Peaches. Plums. Pomegranate. Meats and other protein foods: Beans. Almonds. Sunflower seeds. Pine nuts. Peanuts. Cod. Salmon. Scallops. Shrimp. Tuna. Tilapia. Clams. Oysters. Eggs. Dairy: Low-fat milk. Cheese. Greek yogurt. Beverages: Water. Red wine. Herbal tea. Fats and oils: Extra virgin olive oil. Avocado oil. Grape seed oil. Sweets and desserts: Greek yogurt with honey. Baked apples. Poached pears. Trail mix. Seasoning and other foods: Basil. Cilantro. Coriander. Cumin. Mint.  Parsley. Sage. Rosemary. Tarragon. Garlic. Oregano. Thyme. Pepper. Balsalmic vinegar. Tahini. Hummus. Tomato sauce. Olives. Mushrooms. Limit these Grains: Prepackaged pasta or rice dishes. Prepackaged cereal with added sugar. Vegetables: Deep fried potatoes (french fries). Fruits: Fruit canned in syrup. Meats and other protein foods: Beef. Pork. Lamb. Poultry with skin. Hot dogs. [place]. Dairy: Ice cream. Sour cream. Whole milk. Beverages: Juice. Sugar-sweetened soft drinks. Beer. Liquor and spirits. Fats and oils: Butter. Canola oil. Vegetable oil. Beef fat (tallow). Lard. Sweets and desserts: Cookies. Cakes. Pies. Candy. Seasoning and other foods: Mayonnaise. Premade sauces and marinades. The items listed may not be a complete list. Talk with your dietitian about what dietary choices are right for you. Summary The Mediterranean diet includes both food and lifestyle choices. Eat a variety of fresh fruits and vegetables, beans, nuts, seeds, and whole grains. Limit the amount of red meat and sweets that you eat. Talk with your health care provider about whether it is safe for you to drink red wine in moderation. This means 1 glass a day for nonpregnant women and 2 glasses a day for men. A glass of wine equals 5 oz (150 mL). This information is not intended to replace advice given to you by your health care provider. Make sure you discuss any questions you  have with your health care provider. Document Released: 08/19/2015 Document Revised: 09/21/2015 Document Reviewed: 08/19/2015 Elsevier Interactive Patient Education  2017 [employer].

## 2024-02-01 ENCOUNTER — Ambulatory Visit: Payer: Self-pay | Admitting: Psychology

## 2024-02-01 DIAGNOSIS — R4189 Other symptoms and signs involving cognitive functions and awareness: Secondary | ICD-10-CM

## 2024-02-01 DIAGNOSIS — F0670 Mild neurocognitive disorder due to known physiological condition without behavioral disturbance: Secondary | ICD-10-CM

## 2024-02-01 NOTE — Progress Notes (Signed)
 "  NEUROPSYCHOLOGICAL EVALUATION [place]. [facility]  [place] Department of Neurology  Date of Evaluation: 02/01/2024  REASON FOR REFERRAL   Holly Cochran is a 74 year old, right-handed, White female with 18 years of formal education. She was referred for neuropsychological evaluation by Camie Sevin, PA-C, to assess current neurocognitive functioning, document potential cognitive deficits, and assist with treatment planning. This is her first neuropsychological evaluation.  SUMMARY OF RESULTS   Premorbid cognitive abilities are estimated to be in the low average range based on word reading, though they may be better characterized as at least average when sociodemographic factors are considered. Relative to this baseline estimate, current performance was variable across cognitive domains, with the exception of consistently intact visuospatial abilities.  Specifically, performance was adequate on measures of auditory working memory but below expectations on a task of visual working memory. Processing speed varied across simple tasks, with reduced performance on visual scanning, rapid color naming, and rapid word reading, while performance was adequate on decoding and visual attention and discrimination. As task demands increased to include executive components such as alternating attention and response inhibition, her speed slowed and she made multiple errors across tasks. Her performance on other executive tasks, such as problem solving and verbal abstract reasoning, was within age-related expectations; however, she did lose track of her sorting strategy three times on the problem-solving task. Language abilities were generally below expectations, including verbal fluency and confrontation naming, though performance on the latter improved with semantic and phonemic cueing.  Verbal memory (i.e., word list and short stories) was generally within expectations across encoding, recall, and  recognition. The only exception was the spontaneous retrieval of word list items, which was poor but improved with recognition cues. In contrast, visual memory (i.e., shapes) was reduced on both immediate and delayed recall trials, though performance similarly improved with recognition cues.  On self-report measures, she did not endorse clinically significant symptoms of depression or anxiety.  DIAGNOSTIC IMPRESSION   Results of the current evaluation indicated variability across cognitive domains, with the most consistent deficits being observed in processing speed, executive functioning, and aspects of memory, primarily retrieval. In the setting of preserved functional independence, findings support a diagnosis of mild neurocognitive disorder (mild cognitive impairment).   Regarding etiology, the patient reports no cognitive concerns, mood symptoms, sleep disturbance, or unhealthy lifestyle factors. She is already supplementing for low vitamin B12. No collateral history was available at todays visit; information is limited to what was reported in her neurology visit. Additionally, she has not yet undergone brain imaging, which limits the ability to identify structural abnormalities.  One possibility is that the observed pattern may reflect cerebrovascular changes, particularly given known vascular risk factors. However, this remains speculative, as the pattern of deficits observed is not necessarily unique to any single cause.  Further information, including collateral history and brain imaging, may help clarify potential contributors to her cognitive profile. Repeat neuropsychological testing may also be useful to assess the stability of her cognitive function over time.  ICD-10 Codes: F06.70 Mild neurocognitive disorder (mild cognitive impairment)  RECOMMENDATIONS   A repeat neuropsychological evaluation in 18-24 months (or sooner if functional decline is noted) is recommended.  Prioritize  physical health through diet, exercise, and sleep. Regular physical activity supports cardiovascular health, improves mood, and helps preserve mobility and independence. Aim for at least 150 minutes of moderate aerobic exercise per week (e.g., brisk walking, swimming, gardening). A brain-healthy diet such as the Mediterranean or MIND diet is rich in  fruits, vegetables, whole grains, healthy fats, and lean proteins, and has been associated with reduced risk of cognitive decline. Additionally, getting adequate, quality sleep and managing chronic conditions with the help of healthcare providers are essential components of healthy aging.  Continue to stay socially and mentally engaged. Maintaining strong social connections and regularly stimulating your brain can help protect against cognitive decline. This includes staying connected with friends and family, volunteering, or participating in community groups. Mentally engaging activities--such as reading, doing puzzles, playing strategy games, or learning a new language or musical instrument--promote brain plasticity. If you are interested in activities to support cognitive engagement, this site offers a variety of apps and games organized by difficulty level:  https://www.[facility]row[facility].org/get-to-know-barrow/centers-programs/neurorehabilitation-center/neuro-rehab-apps-and-games/  Consider implementing compensatory strategies to maximize independence and maintain daily functioning. Examples include:  Adhere to routine. Compensatory strategies work best when they are used consistently. Use a planner, calendar, or white board that has the schedule and important events for the day clearly listed to reference and cross off when tasks are complete.  Ask for written information, especially if it is new or unfamiliar (e.g., information provided at a doctor's appointment).  Create an organized environment. Keep items that can be easily misplaced in a sensible location  and get into the habit of always returning the items to those places. Pay attention and reduce distractions. Make a point of focusing attention on information you want to remember. One-on-one interaction is more likely to facilitate attention and minimize distraction. Make eye contact and repeat the information out loud after you hear it. Reduce interruptions or distractions especially when attempting to learn new information.  Create associations. When learning something new, think about and understand the information. Explain it in your own words or try to associate it with something you already know. Take notes to help remember important details. Evaluate goals and plan accordingly. When confronted by many different tasks, begin by making a list that prioritizes each task and estimates the time it will take to complete. Break down complicated tasks into smaller, more manageable steps. Focus on one task at a time and complete each task before starting another. Avoid multitasking.  Performance across neurocognitive testing is not a strong predictor of an individual's safety operating a motor vehicle. Should she wish to pursue a formalized driving evaluation, she could reach out to the following agencies:  [employer] in [place]: [phone] [facility] in [place]: [phone] [facility] in [place]: [phone] [facility] in [place]: [phone] or [phone]  If patient requires legal assistance with durable powers of attorney, medical decision making, long-term care resource access, or other aspects of estate planning, they may consider contacting [employer] at [phone] for a free consultation.  DISPOSITION   Patient will follow up with the referring provider, Ms. Wertman. She should return for repeat neuropsychological testing in 18-24 months to monitor her course and assist with diagnosis and treatment  planning. She will be provided verbal feedback in approximately one week regarding the findings and impression during this visit.  The remainder of the report includes the details of the patient's background and a table of results from the current evaluation, which support the summary and recommendations described above.  BACKGROUND   History of Presenting Illness: The following information was obtained from a review of medical records and an interview with the patient. Briefly, the patient was evaluated by Camie Sevin, PA-C, at [facility] on 01/14/2024 for short-term memory concerns over the past six to nine  months. During that visit, her partner also noted personality changes, reporting that the patient was much less engaged in conversations and activities. Patient has a history of vitamin B12 deficiency and has been receiving monthly injections, with reported improvement. MoCA = 16/30. She was referred for neuropsychological evaluation accordingly.  Cognitive Functioning: During todays appointment, the patient reported mild forgetfulness over the past six to nine months, which she described as not significant. She denied forgetting details of conversations or events and denied misplacing items. She had some difficulty articulating specific examples of forgetfulness but noted that she might have mild difficulty recalling a list of words after a 20-minute delay. She otherwise denied changes in attention, processing speed, word-finding, navigation, or executive functioning (e.g., planning, organizing).  Physical Functioning: Patient denied difficulty with sleep initiation or maintenance. Appetite is stable, with no reported changes in sense of smell or taste. Vision, following cataract surgery, and hearing are stable. She reported balance difficulties related to foot drop and the presence of a rod in her left leg but denied any recent falls. She also denied tremors.  Emotional Functioning:  Patient described her recent mood as really good and denied symptoms of anxiety, depression, or suicidal ideation. It was noted during a prior neurology visit that her partner perceived her as becoming less engaged in conversations and social activities; however, the patient reported today that she remains socially active, stating that she makes an effort to see friends every Thursday night, plays Scrabble, takes walks, and watches television.  Neuroimaging: MRI of the brain is scheduled to be completed on 02/09/2024.  Other Relevant Medical History: Remarkable for hyperlipidemia, hypothyroidism, gastroesophageal reflux disease, and osteoporosis. Please refer to the medical record for a more comprehensive problem list. During her neurology visit, she denied a history of prediabetes despite it being previously. No history of stroke, CNS infection, seizure, or serious head injury was reported. Regarding the latter, she sustained a mechanical fall in 2019 in which she struck her head but did not lose consciousness and did not experience any persistent symptoms afterward.  Current Medications: Per record, acetaminophen , aspirin , denosumab , escitalopram , levothyroxine , meloxicam , omega-3 acid ethyl esters, omeprazole , pravastatin , and vitamin D3.   Functional Status: Patient independently performs all basic and instrumental activities of daily living, including driving, medications, and finances, without reported difficulty.  Family Neurological History: Unremarkable.  Psychiatric History: Patient denied a history of depression, anxiety, counseling, suicidal ideation, hallucinations, and psychiatric hospitalizations. She reported that she is unsure why she was ever prescribed a medication for her mood but that she has been taking it for a long time.  Substance Use History: Patient reported infrequent alcohol consumption and denied current use of nicotine, marijuana, and other illicit substances.  Additionally, there is no reported history of past problematic substance use.  Social and Developmental History: Patient was born in [place], [place]. History of perinatal complications and developmental delays was not reported. She resides with her partner. She does not have any children.  Educational and Occupational History: No history of childhood learning disability, special education services, or grade retention was reported. 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  BEHAVIORAL OBSERVATIONS   Patient arrived on time and was unaccompanied. She ambulated independently and without gait disturbance. She was alert and fully oriented. She was appropriately groomed and dressed for the setting. No significant sensory or motor abnormalities were observed. Vision (with glasses)  and hearing were adequate for testing purposes. Speech was of normal rate, prosody, and volume. No conversational word-finding difficulties, paraphasic errors, or dysarthria were observed. Comprehension was conversationally intact. Thought processes were linear, logical, and coherent. Thought content was organized and devoid of delusions. Insight appeared appropriate. Affect was even and congruent with euthymic mood. She was cooperative and appeared to give adequate effort during testing, including on standalone and embedded measures of performance validity. Results are thought to accurately reflect her cognitive functioning at this time.  NEUROPSYCHOLOGICAL TESTING RESULTS   Tests Administered: Animal Naming Test; Brief Visuospatial Memory Test-Revised (BVMT-R) - Form 1; Controlled Oral Word Association Test (COWAT): FAS; Delis-Kaplan Executive Function System (D-KEFS) - Subtest(s): Color-Word Interference Test; Geriatric Anxiety Scale-10 Item (GAS-10); Geriatric Depression Scale Short Form (GDS-SF); Hopkins Verbal  Learning Test-Revised (HVLT-R) - From 1; Neuropsychological Assessment Battery (NAB) - Subtest(s): Naming Form 1; Repeatable Battery for the Assessment of Neuropsychological Status Update (RBANS Update) Form A - Subtest(s): Line Orientation; Standalone performance validity tests (PVTs); Test of Premorbid Functioning (TOPF); Trail Making Test (TMT); Wechsler Adult Intelligence Scale Fifth Edition (WAIS-5) - Subtest(s): Similarities, Clinical Cytogeneticist, Matrix Reasoning, Digit Sequencing, Coding, Running Digits, Symbol Search, Symbol Span; Wechsler Memory Scale Fourth Edition (WMS-IV) - Subtest(s): Logical Memory (LM); and [place]  Card Sorting Test 64 Card Version (WCST-64).  Test results are provided in the table below. Whenever possible, the patient's scores were compared against age-, sex-, and education-corrected normative samples. Interpretive descriptions are based on the AACN consensus conference statement on uniform labeling (Guilmette et al., 2020).  PREMORBID FUNCTIONING RAW  RANGE  TOPF 27 StdS=87 Low Average  ATTENTION & WORKING MEMORY RAW  RANGE  WAIS-5 Digit Sequencing -- ss=6 Low Average  WAIS-5 Running Digits -- ss=10 Average  WAIS-5 Symbol Span -- ss=5 Below Average  PROCESSING SPEED RAW  RANGE  Trails A 70''0e T=28 Exceptionally Low  WAIS-5 Coding  -- ss=7 Low Average  WAIS-5 Symbol Search -- ss=7 Low Average  DKEFS CWIT Color Naming 73''1e ss=1 Exceptionally Low  DKEFS CWIT Word Reading 47''0e ss=1 Exceptionally Low  EXECUTIVE FUNCTION RAW  RANGE  Trails B 209''2e T=29 Exceptionally Low  WAIS-5 Similarities -- ss=6 Low Average  COWAT Letter Fluency 5+6+5 T=23 Exceptionally Low  DKEFS CWIT Inhibition 170''4e ss=1 Exceptionally Low  DKEFS CWIT Inhibition/Switching 140''6e ss=2 Exceptionally Low  WCST-64 Total Errors 22 T=45 Average  WCST-64 Perseverative Errors 8 T=61 High Average  WCST-64 Nonperseverative Errors 14 T=37 Low Average  WCST-64 Categories Completed 1 11-16%ile Low  Average  WCSR-64 FMS 3 -- --  LANGUAGE RAW  RANGE  COWAT Letter Fluency 5+6+5 T=23 Exceptionally Low  Animal Naming Test 8 T=18 Exceptionally Low  NAB Naming Test 27/31  +1 w/[place]  +1 w/PC T=32 BNL  VISUOSPATIAL RAW  RANGE  RBANS Line Orientation -- 51-75%ile Average  WAIS-5 Block Design -- ss=8 Average  BVMT-R Copy Trial 11/12 -- WNL  VERBAL LEARNING & MEMORY RAW  RANGE  HVLT-R Learning Trials (4+6+7)/36 T=37 Low Average  HVLT-R Delayed Recall 2/12 T=26 Exceptionally Low  HVLT-R Recognition Hits 12 -- --  HVLT-R Recognition False Positives 2 -- --  HVLT-R Discrimination Index 10 T=47 Average  WMS-IV LM-I  (8+10+9)/53 ss=8 Average  WMS-IV LM-II  (4+9)/39 ss=8 Average  WMS-IV LM Recognition  (8+12)/23 >75%ile High Average to Exceptionally High  VISUAL LEARNING & MEMORY RAW  RANGE  BVMT-R Total Recall (0+2+4)/36 T=24 Exceptionally Low  BVMT-R Delayed Recall 1/12 T=20 Exceptionally Low  BVMT-R Recognition Hits 5 >16%ile  WNL  BVMT-R Recognition False Alarms 1 11-16%ile Low Average  BVMT-R Recognition Discrimination Index 4 11-16%ile Low Average  QUESTIONNAIRES RAW  RANGE  GDS-SF 0 -- Minimal  GAS-10 1 -- Minimal  *Note: ss = scaled score; StdS = standard score; T = t-score; C/S = corrected raw score; WNL = within normal limits; BNL= below normal limits; D/C = discontinued. Scores from skewed distributions are typically interpreted as WNL (>=16th %ile) or BNL (<16th %ile).   INFORMED CONSENT   Patient was provided with a verbal description of the nature and purpose of the neuropsychological evaluation. Also reviewed were the foreseeable risks and/or discomforts and benefits of the procedure, limits of confidentiality, and mandatory reporting requirements of this provider. Patient was given the opportunity to have their questions answered. Oral consent to participate was provided by the patient.   This report was prepared as part of a clinical evaluation and is not intended for forensic  use.  SERVICE   This evaluation was conducted by Renda Beckwith, Psy.D. In addition to time spent directly with the patient, total professional time (120 minutes) includes record review, integration of relevant medical history, test selection, interpretation of findings, and report preparation. A technician, Lonell Jude, B.S., provided testing and scoring assistance (145 minutes).  Psychiatric Diagnostic Evaluation Services (Professional): 09208 x 1 Neuropsychological Testing Evaluation Services (Professional): 03867 x 1 Neuropsychological Testing Evaluation Services (Professional): 03866 x 1 Neuropsychological Test Administration and Scoring Radiographer, Therapeutic): [phone] x 1 Neuropsychological Test Administration and Scoring (Technician): [phone] x 4  This report was generated using voice recognition software. While this document has been carefully reviewed, transcription errors may be present. I apologize in advance for any inconvenience. Please contact me if further clarification is needed.            Renda Beckwith, Psy.D.             Neuropsychologist  "

## 2024-02-01 NOTE — Progress Notes (Signed)
" ° °  Psychometrician Note   Cognitive testing was administered to Holly Cochran by Lonell Jude, B.S. (psychometrist) under the supervision of Dr. Renda Beckwith, Psy.D., licensed psychologist on 02/01/2024. [name] did not appear overtly distressed by the testing session per behavioral observation or responses across self-report questionnaires. Rest breaks were offered.   The battery of tests administered was selected by Dr. Renda Beckwith, Psy.D. with consideration to [name]'s current level of functioning, the nature of her symptoms, emotional and behavioral responses during interview, level of literacy, observed level of motivation/effort, and the nature of the referral question. This battery was communicated to the psychometrist. Communication between Dr. Renda Beckwith, Psy.D. and the psychometrist was ongoing throughout the evaluation and Dr. Renda Beckwith, Psy.D. was immediately accessible at all times. Dr. Renda Beckwith, Psy.D. provided supervision to the psychometrist on the date of this service to the extent necessary to assure the quality of all services provided.    Holly Cochran will return within approximately 1-2 weeks for an interactive feedback session with Dr. Beckwith at which time her test performances, clinical impressions, and treatment recommendations will be reviewed in detail. [name] understands she can contact our office should she require our assistance before this time.  A total of 145 minutes of billable time were spent face-to-face with [name] by the psychometrist. This includes both test administration and scoring time. Billing for these services is reflected in the clinical report generated by Dr. Renda Beckwith, Psy.D.  This note reflects time spent with the psychometrician and does not include test scores or any clinical interpretations made by Dr. Beckwith. The full report will follow in a separate note. "

## 2024-02-06 LAB — HM MAMMOGRAPHY

## 2024-02-07 ENCOUNTER — Encounter: Payer: Self-pay | Admitting: Internal Medicine

## 2024-02-09 ENCOUNTER — Other Ambulatory Visit

## 2024-02-14 ENCOUNTER — Ambulatory Visit: Payer: Self-pay | Admitting: Psychology

## 2024-02-14 ENCOUNTER — Ambulatory Visit

## 2024-02-14 DIAGNOSIS — E538 Deficiency of other specified B group vitamins: Secondary | ICD-10-CM

## 2024-02-14 DIAGNOSIS — F0670 Mild neurocognitive disorder due to known physiological condition without behavioral disturbance: Secondary | ICD-10-CM

## 2024-02-14 MED ORDER — CYANOCOBALAMIN 1000 MCG/ML IJ SOLN
1000.0000 ug | Freq: Once | INTRAMUSCULAR | Status: AC
  Administered 2024-02-14: 1000 ug via INTRAMUSCULAR

## 2024-02-14 NOTE — Progress Notes (Signed)
Pt was given B12 injection with no complications.

## 2024-02-14 NOTE — Progress Notes (Signed)
" ° °  NEUROPSYCHOLOGY FEEDBACK SESSION [place]. [facility]  [place] Department of Neurology  Date of Feedback Session: 02/14/2024  REASON FOR REFERRAL   Holly Cochran is a 74 year old, right-handed, White female with 18 years of formal education. She was referred for neuropsychological evaluation by Camie Sevin, PA-C, to assess current neurocognitive functioning, document potential cognitive deficits, and assist with treatment planning. This is her first neuropsychological evaluation.  FEEDBACK   Patient completed a comprehensive neuropsychological evaluation on 02/01/2024. Please refer to that encounter for the full report and recommendations. Briefly, results indicated variability across cognitive domains, with the most consistent deficits being observed in processing speed, executive functioning, and aspects of memory, primarily retrieval. In the setting of preserved functional independence, findings support a diagnosis of mild neurocognitive disorder (mild cognitive impairment). Regarding etiology, the patient reports no cognitive concerns, mood symptoms, sleep disturbance, or unhealthy lifestyle factors. She is already supplementing for low vitamin B12. Additionally, she has not yet undergone brain imaging, which limits the ability to identify structural abnormalities. One possibility is that the observed pattern may reflect cerebrovascular changes, particularly given known vascular risk factors. However, this remains speculative, as the pattern of deficits observed is not necessarily unique to any single cause.  Today, the patient was accompanied by her partner. They were provided verbal feedback regarding the findings and impression during this visit, and their questions were answered. A copy of the report was provided at the conclusion of the visit.  DISPOSITION   Patient should return for repeat neuropsychological testing in 18 to 24 months to monitor her course and assist with  diagnosis and treatment planning.  SERVICE   This feedback session was conducted by Renda Beckwith, Psy.D. One unit of 03867 (35 minutes) was billed for Dr. Beckwith' time spent in preparing, conducting, and documenting the current feedback session.  This report was generated using voice recognition software. While this document has been carefully reviewed, transcription errors may be present. I apologize in advance for any inconvenience. Please contact me if further clarification is needed.  "

## 2024-02-24 ENCOUNTER — Other Ambulatory Visit

## 2024-03-13 ENCOUNTER — Ambulatory Visit

## 2024-04-14 ENCOUNTER — Ambulatory Visit

## 2024-05-09 ENCOUNTER — Encounter: Admitting: Internal Medicine

## 2024-05-09 ENCOUNTER — Ambulatory Visit

## 2024-05-12 ENCOUNTER — Encounter: Admitting: Internal Medicine
